# Patient Record
Sex: Female | Born: 1935 | Marital: Single | Smoking: Never smoker
Health system: Southern US, Community
[De-identification: ages and names within clinical notes are randomized; demographics above are authoritative.]

## PROBLEM LIST (undated history)

## (undated) DIAGNOSIS — M67919 Unspecified disorder of synovium and tendon, unspecified shoulder: Secondary | ICD-10-CM

## (undated) DIAGNOSIS — K635 Polyp of colon: Secondary | ICD-10-CM

## (undated) DIAGNOSIS — I10 Essential (primary) hypertension: Secondary | ICD-10-CM

## (undated) DIAGNOSIS — K259 Gastric ulcer, unspecified as acute or chronic, without hemorrhage or perforation: Secondary | ICD-10-CM

## (undated) DIAGNOSIS — E785 Hyperlipidemia, unspecified: Secondary | ICD-10-CM

## (undated) DIAGNOSIS — C801 Malignant (primary) neoplasm, unspecified: Secondary | ICD-10-CM

## (undated) DIAGNOSIS — B029 Zoster without complications: Secondary | ICD-10-CM

## (undated) DIAGNOSIS — M1990 Unspecified osteoarthritis, unspecified site: Secondary | ICD-10-CM

## (undated) DIAGNOSIS — K219 Gastro-esophageal reflux disease without esophagitis: Secondary | ICD-10-CM

## (undated) HISTORY — DX: Gastric ulcer, unspecified as acute or chronic, without hemorrhage or perforation: K25.9

## (undated) HISTORY — DX: Polyp of colon: K63.5

## (undated) HISTORY — DX: Unspecified osteoarthritis, unspecified site: M19.90

## (undated) HISTORY — PX: KNEE ARTHROSCOPY: SUR90

## (undated) HISTORY — DX: Essential (primary) hypertension: I10

## (undated) HISTORY — DX: Gastro-esophageal reflux disease without esophagitis: K21.9

## (undated) HISTORY — DX: Malignant (primary) neoplasm, unspecified: C80.1

## (undated) HISTORY — DX: Hyperlipidemia, unspecified: E78.5

## (undated) HISTORY — DX: Unspecified disorder of synovium and tendon, unspecified shoulder: M67.919

## (undated) HISTORY — DX: Zoster without complications: B02.9

---

## 2014-11-13 ENCOUNTER — Encounter: Payer: Self-pay | Admitting: Internal Medicine

## 2016-09-05 DIAGNOSIS — C44722 Squamous cell carcinoma of skin of right lower limb, including hip: Secondary | ICD-10-CM | POA: Diagnosis not present

## 2016-09-15 DIAGNOSIS — J019 Acute sinusitis, unspecified: Secondary | ICD-10-CM | POA: Diagnosis not present

## 2016-12-05 DIAGNOSIS — C44722 Squamous cell carcinoma of skin of right lower limb, including hip: Secondary | ICD-10-CM | POA: Diagnosis not present

## 2016-12-15 DIAGNOSIS — D492 Neoplasm of unspecified behavior of bone, soft tissue, and skin: Secondary | ICD-10-CM | POA: Diagnosis not present

## 2016-12-15 DIAGNOSIS — L570 Actinic keratosis: Secondary | ICD-10-CM | POA: Diagnosis not present

## 2017-01-19 DIAGNOSIS — D492 Neoplasm of unspecified behavior of bone, soft tissue, and skin: Secondary | ICD-10-CM | POA: Diagnosis not present

## 2017-01-19 DIAGNOSIS — C44219 Basal cell carcinoma of skin of left ear and external auricular canal: Secondary | ICD-10-CM | POA: Diagnosis not present

## 2017-01-23 DIAGNOSIS — C44219 Basal cell carcinoma of skin of left ear and external auricular canal: Secondary | ICD-10-CM | POA: Diagnosis not present

## 2017-02-02 DIAGNOSIS — L570 Actinic keratosis: Secondary | ICD-10-CM | POA: Diagnosis not present

## 2017-02-02 DIAGNOSIS — C44219 Basal cell carcinoma of skin of left ear and external auricular canal: Secondary | ICD-10-CM | POA: Diagnosis not present

## 2017-02-15 DIAGNOSIS — C44219 Basal cell carcinoma of skin of left ear and external auricular canal: Secondary | ICD-10-CM | POA: Diagnosis not present

## 2017-02-17 DIAGNOSIS — C44212 Basal cell carcinoma of skin of right ear and external auricular canal: Secondary | ICD-10-CM | POA: Diagnosis not present

## 2017-03-06 DIAGNOSIS — C44722 Squamous cell carcinoma of skin of right lower limb, including hip: Secondary | ICD-10-CM | POA: Diagnosis not present

## 2017-04-26 DIAGNOSIS — Z23 Encounter for immunization: Secondary | ICD-10-CM | POA: Diagnosis not present

## 2017-05-25 DIAGNOSIS — E782 Mixed hyperlipidemia: Secondary | ICD-10-CM | POA: Diagnosis not present

## 2017-05-25 DIAGNOSIS — Z6832 Body mass index (BMI) 32.0-32.9, adult: Secondary | ICD-10-CM | POA: Diagnosis not present

## 2017-05-25 DIAGNOSIS — E559 Vitamin D deficiency, unspecified: Secondary | ICD-10-CM | POA: Diagnosis not present

## 2017-05-25 DIAGNOSIS — I10 Essential (primary) hypertension: Secondary | ICD-10-CM | POA: Diagnosis not present

## 2017-05-25 DIAGNOSIS — E119 Type 2 diabetes mellitus without complications: Secondary | ICD-10-CM | POA: Diagnosis not present

## 2017-06-22 DIAGNOSIS — J019 Acute sinusitis, unspecified: Secondary | ICD-10-CM | POA: Diagnosis not present

## 2017-06-28 DIAGNOSIS — E119 Type 2 diabetes mellitus without complications: Secondary | ICD-10-CM | POA: Diagnosis not present

## 2017-06-28 DIAGNOSIS — H35372 Puckering of macula, left eye: Secondary | ICD-10-CM | POA: Diagnosis not present

## 2017-06-28 DIAGNOSIS — H35342 Macular cyst, hole, or pseudohole, left eye: Secondary | ICD-10-CM | POA: Diagnosis not present

## 2017-06-28 DIAGNOSIS — D3132 Benign neoplasm of left choroid: Secondary | ICD-10-CM | POA: Diagnosis not present

## 2017-09-12 DIAGNOSIS — H35373 Puckering of macula, bilateral: Secondary | ICD-10-CM | POA: Diagnosis not present

## 2017-09-27 ENCOUNTER — Ambulatory Visit: Payer: Self-pay | Admitting: Internal Medicine

## 2017-09-27 ENCOUNTER — Ambulatory Visit (INDEPENDENT_AMBULATORY_CARE_PROVIDER_SITE_OTHER): Payer: Medicare Other | Admitting: Internal Medicine

## 2017-09-27 VITALS — BP 118/64 | HR 76 | Temp 98.4°F | Ht 62.0 in | Wt 177.4 lb

## 2017-09-27 DIAGNOSIS — M109 Gout, unspecified: Secondary | ICD-10-CM | POA: Diagnosis not present

## 2017-09-27 DIAGNOSIS — N183 Chronic kidney disease, stage 3 unspecified: Secondary | ICD-10-CM | POA: Insufficient documentation

## 2017-09-27 DIAGNOSIS — Z1329 Encounter for screening for other suspected endocrine disorder: Secondary | ICD-10-CM | POA: Diagnosis not present

## 2017-09-27 DIAGNOSIS — Z1283 Encounter for screening for malignant neoplasm of skin: Secondary | ICD-10-CM | POA: Diagnosis not present

## 2017-09-27 DIAGNOSIS — E119 Type 2 diabetes mellitus without complications: Secondary | ICD-10-CM

## 2017-09-27 DIAGNOSIS — K219 Gastro-esophageal reflux disease without esophagitis: Secondary | ICD-10-CM | POA: Diagnosis not present

## 2017-09-27 DIAGNOSIS — E559 Vitamin D deficiency, unspecified: Secondary | ICD-10-CM | POA: Insufficient documentation

## 2017-09-27 DIAGNOSIS — I10 Essential (primary) hypertension: Secondary | ICD-10-CM | POA: Diagnosis not present

## 2017-09-27 DIAGNOSIS — Z85828 Personal history of other malignant neoplasm of skin: Secondary | ICD-10-CM | POA: Diagnosis not present

## 2017-09-27 DIAGNOSIS — M2550 Pain in unspecified joint: Secondary | ICD-10-CM

## 2017-09-27 DIAGNOSIS — E1122 Type 2 diabetes mellitus with diabetic chronic kidney disease: Secondary | ICD-10-CM | POA: Insufficient documentation

## 2017-09-27 LAB — URINALYSIS, ROUTINE W REFLEX MICROSCOPIC
Bilirubin Urine: NEGATIVE
Hgb urine dipstick: NEGATIVE
KETONES UR: NEGATIVE
Nitrite: NEGATIVE
PH: 6.5 (ref 5.0–8.0)
SPECIFIC GRAVITY, URINE: 1.015 (ref 1.000–1.030)
TOTAL PROTEIN, URINE-UPE24: NEGATIVE
URINE GLUCOSE: NEGATIVE
UROBILINOGEN UA: 0.2 (ref 0.0–1.0)

## 2017-09-27 LAB — CBC WITH DIFFERENTIAL/PLATELET
BASOS PCT: 0.9 % (ref 0.0–3.0)
Basophils Absolute: 0.1 10*3/uL (ref 0.0–0.1)
EOS ABS: 0.2 10*3/uL (ref 0.0–0.7)
EOS PCT: 2.4 % (ref 0.0–5.0)
HEMATOCRIT: 36.2 % (ref 36.0–46.0)
HEMOGLOBIN: 12.2 g/dL (ref 12.0–15.0)
LYMPHS PCT: 21 % (ref 12.0–46.0)
Lymphs Abs: 1.7 10*3/uL (ref 0.7–4.0)
MCHC: 33.7 g/dL (ref 30.0–36.0)
MCV: 97.5 fl (ref 78.0–100.0)
Monocytes Absolute: 0.7 10*3/uL (ref 0.1–1.0)
Monocytes Relative: 8.4 % (ref 3.0–12.0)
NEUTROS ABS: 5.6 10*3/uL (ref 1.4–7.7)
Neutrophils Relative %: 67.3 % (ref 43.0–77.0)
PLATELETS: 323 10*3/uL (ref 150.0–400.0)
RBC: 3.72 Mil/uL — ABNORMAL LOW (ref 3.87–5.11)
RDW: 12.6 % (ref 11.5–15.5)
WBC: 8.3 10*3/uL (ref 4.0–10.5)

## 2017-09-27 LAB — COMPREHENSIVE METABOLIC PANEL
ALT: 14 U/L (ref 0–35)
AST: 18 U/L (ref 0–37)
Albumin: 3.9 g/dL (ref 3.5–5.2)
Alkaline Phosphatase: 53 U/L (ref 39–117)
BUN: 22 mg/dL (ref 6–23)
CALCIUM: 9.9 mg/dL (ref 8.4–10.5)
CHLORIDE: 102 meq/L (ref 96–112)
CO2: 31 meq/L (ref 19–32)
CREATININE: 1.38 mg/dL — AB (ref 0.40–1.20)
GFR: 38.37 mL/min — ABNORMAL LOW (ref >60.00)
Glucose, Bld: 101 mg/dL — ABNORMAL HIGH (ref 70–99)
POTASSIUM: 4.3 meq/L (ref 3.5–5.1)
SODIUM: 141 meq/L (ref 135–145)
Total Bilirubin: 1 mg/dL (ref 0.2–1.2)
Total Protein: 6.8 g/dL (ref 6.0–8.3)

## 2017-09-27 LAB — LIPID PANEL
CHOL/HDL RATIO: 4
Cholesterol: 242 mg/dL — ABNORMAL HIGH (ref 0–200)
HDL: 56.6 mg/dL (ref >39.00)
LDL CALC: 157 mg/dL — AB (ref 0–99)
NONHDL: 185.11
TRIGLYCERIDES: 142 mg/dL (ref 0.0–149.0)
VLDL: 28.4 mg/dL (ref 0.0–40.0)

## 2017-09-27 LAB — MICROALBUMIN / CREATININE URINE RATIO
CREATININE, U: 179.2 mg/dL
MICROALB UR: 1.7 mg/dL (ref 0.0–1.9)
Microalb Creat Ratio: 0.9 mg/g (ref 0.0–30.0)

## 2017-09-27 LAB — VITAMIN D 25 HYDROXY (VIT D DEFICIENCY, FRACTURES): VITD: 50.64 ng/mL (ref 30.00–100.00)

## 2017-09-27 LAB — TSH: TSH: 2.12 u[IU]/mL (ref 0.35–4.50)

## 2017-09-27 LAB — URIC ACID: Uric Acid, Serum: 6.9 mg/dL (ref 2.4–7.0)

## 2017-09-27 LAB — HEMOGLOBIN A1C: Hgb A1c MFr Bld: 5.9 % (ref 4.6–6.5)

## 2017-09-27 MED ORDER — SITAGLIPTIN PHOSPHATE 100 MG PO TABS: 100.0000 mg | ORAL_TABLET | Freq: Every day | ORAL | 1 refills | Status: DC

## 2017-09-27 MED ORDER — LOSARTAN POTASSIUM-HCTZ 100-25 MG PO TABS: 1.0000 | ORAL_TABLET | Freq: Every day | ORAL | 1 refills | Status: DC

## 2017-09-27 NOTE — Progress Notes (Signed)
Pre visit review using our clinic review tool, if applicable. No additional management support is needed unless otherwise documented below in the visit note. 

## 2017-09-27 NOTE — Patient Instructions (Signed)
Try debrox drops 5 drops 2x per day to right ear x 4-7 days  F/u in 3-4 months sooner if needed    Hypertension Hypertension is another name for high blood pressure. High blood pressure forces your heart to work harder to pump blood. This can cause problems over time. There are two numbers in a blood pressure reading. There is a top number (systolic) over a bottom number (diastolic). It is best to have a blood pressure below 120/80. Healthy choices can help lower your blood pressure. You may need medicine to help lower your blood pressure if:  Your blood pressure cannot be lowered with healthy choices.  Your blood pressure is higher than 130/80.  Follow these instructions at home: Eating and drinking  If directed, follow the DASH eating plan. This diet includes: ? Filling half of your plate at each meal with fruits and vegetables. ? Filling one quarter of your plate at each meal with whole grains. Whole grains include whole wheat pasta, brown rice, and whole grain bread. ? Eating or drinking low-fat dairy products, such as skim milk or low-fat yogurt. ? Filling one quarter of your plate at each meal with low-fat (lean) proteins. Low-fat proteins include fish, skinless chicken, eggs, beans, and tofu. ? Avoiding fatty meat, cured and processed meat, or chicken with skin. ? Avoiding premade or processed food.  Eat less than 1,500 mg of salt (sodium) a day.  Limit alcohol use to no more than 1 drink a day for nonpregnant women and 2 drinks a day for men. One drink equals 12 oz of beer, 5 oz of wine, or 1 oz of hard liquor. Lifestyle  Work with your doctor to stay at a healthy weight or to lose weight. Ask your doctor what the best weight is for you.  Get at least 30 minutes of exercise that causes your heart to beat faster (aerobic exercise) most days of the week. This may include walking, swimming, or biking.  Get at least 30 minutes of exercise that strengthens your muscles (resistance  exercise) at least 3 days a week. This may include lifting weights or pilates.  Do not use any products that contain nicotine or tobacco. This includes cigarettes and e-cigarettes. If you need help quitting, ask your doctor.  Check your blood pressure at home as told by your doctor.  Keep all follow-up visits as told by your doctor. This is important. Medicines  Take over-the-counter and prescription medicines only as told by your doctor. Follow directions carefully.  Do not skip doses of blood pressure medicine. The medicine does not work as well if you skip doses. Skipping doses also puts you at risk for problems.  Ask your doctor about side effects or reactions to medicines that you should watch for. Contact a doctor if:  You think you are having a reaction to the medicine you are taking.  You have headaches that keep coming back (recurring).  You feel dizzy.  You have swelling in your ankles.  You have trouble with your vision. Get help right away if:  You get a very bad headache.  You start to feel confused.  You feel weak or numb.  You feel faint.  You get very bad pain in your: ? Chest. ? Belly (abdomen).  You throw up (vomit) more than once.  You have trouble breathing. Summary  Hypertension is another name for high blood pressure.  Making healthy choices can help lower blood pressure. If your blood pressure cannot be  controlled with healthy choices, you may need to take medicine. This information is not intended to replace advice given to you by your health care provider. Make sure you discuss any questions you have with your health care provider. Document Released: 01/25/2008 Document Revised: 07/06/2016 Document Reviewed: 07/06/2016 Elsevier Interactive Patient Education  2018 [place], Adult The ears produce a substance called earwax that helps keep bacteria out of the ear and protects the skin in the ear canal. Occasionally,  earwax can build up in the ear and cause discomfort or hearing loss. What increases the risk? This condition is more likely to develop in people who:  Are female.  Are elderly.  Naturally produce more earwax.  Clean their ears often with cotton swabs.  Use earplugs often.  Use in-ear headphones often.  Wear hearing aids.  Have narrow ear canals.  Have earwax that is overly thick or sticky.  Have eczema.  Are dehydrated.  Have excess hair in the ear canal.  What are the signs or symptoms? Symptoms of this condition include:  Reduced or muffled hearing.  A feeling of fullness in the ear or feeling that the ear is plugged.  Fluid coming from the ear.  Ear pain.  Ear itch.  Ringing in the ear.  Coughing.  An obvious piece of earwax that can be seen inside the ear canal.  How is this diagnosed? This condition may be diagnosed based on:  Your symptoms.  Your medical history.  An ear exam. During the exam, your health care provider will look into your ear with an instrument called an otoscope.  You may have tests, including a hearing test. How is this treated? This condition may be treated by:  Using ear drops to soften the earwax.  Having the earwax removed by a health care provider. The health care provider may: ? Flush the ear with water. ? Use an instrument that has a loop on the end (curette). ? Use a suction device.  Surgery to remove the wax buildup. This may be done in severe cases.  Follow these instructions at home:  Take over-the-counter and prescription medicines only as told by your health care provider.  Do not put any objects, including cotton swabs, into your ear. You can clean the opening of your ear canal with a washcloth or facial tissue.  Follow instructions from your health care provider about cleaning your ears. Do not over-clean your ears.  Drink enough fluid to keep your urine clear or pale yellow. This will help to thin the  earwax.  Keep all follow-up visits as told by your health care provider. If earwax builds up in your ears often or if you use hearing aids, consider seeing your health care provider for routine, preventive ear cleanings. Ask your health care provider how often you should schedule your cleanings.  If you have hearing aids, clean them according to instructions from the manufacturer and your health care provider. Contact a health care provider if:  You have ear pain.  You develop a fever.  You have blood, pus, or other fluid coming from your ear.  You have hearing loss.  You have ringing in your ears that does not go away.  Your symptoms do not improve with treatment.  You feel like the room is spinning (vertigo). Summary  Earwax can build up in the ear and cause discomfort or hearing loss.  The most common symptoms of this condition include reduced or muffled  hearing and a feeling of fullness in the ear or feeling that the ear is plugged.  This condition may be diagnosed based on your symptoms, your medical history, and an ear exam.  This condition may be treated by using ear drops to soften the earwax or by having the earwax removed by a health care provider.  Do not put any objects, including cotton swabs, into your ear. You can clean the opening of your ear canal with a washcloth or facial tissue. This information is not intended to replace advice given to you by your health care provider. Make sure you discuss any questions you have with your health care provider. Document Released: 09/15/2004 Document Revised: 10/19/2016 Document Reviewed: 10/19/2016 Elsevier Interactive Patient Education  2018 [employer]vier Inc[employer]  

## 2017-09-30 ENCOUNTER — Other Ambulatory Visit: Payer: Self-pay | Admitting: Internal Medicine

## 2017-09-30 ENCOUNTER — Encounter: Payer: Self-pay | Admitting: Internal Medicine

## 2017-09-30 DIAGNOSIS — M109 Gout, unspecified: Secondary | ICD-10-CM | POA: Insufficient documentation

## 2017-09-30 DIAGNOSIS — E119 Type 2 diabetes mellitus without complications: Secondary | ICD-10-CM

## 2017-09-30 DIAGNOSIS — K219 Gastro-esophageal reflux disease without esophagitis: Secondary | ICD-10-CM | POA: Insufficient documentation

## 2017-09-30 DIAGNOSIS — I10 Essential (primary) hypertension: Secondary | ICD-10-CM | POA: Insufficient documentation

## 2017-09-30 MED ORDER — SITAGLIPTIN PHOSPHATE 50 MG PO TABS: 50.0000 mg | ORAL_TABLET | Freq: Every day | ORAL | 1 refills | Status: DC

## 2017-09-30 NOTE — Progress Notes (Signed)
Chief Complaint  Patient presents with  . Establish Care   Establish care sister who is also a pt with her at visit today  1. She feels like something is in right ear and would like it checked  2. DM 2 on Metformin 500 mg bid and Januvia 100 qd could not tolerate statin in the past fasting cbgs 118-149 per pt She saw [facility] last week  3. HTN controlled on Hyzaar 100-25  4. Denies recent falls walks with cane    Review of Systems  Constitutional: Negative for weight loss.  HENT:       +something in right ear   Eyes:       No vision problems  Respiratory: Negative for shortness of breath.   Cardiovascular: Negative for chest pain.  Gastrointestinal: Positive for heartburn. Negative for abdominal pain.  Musculoskeletal: Negative for falls.  Skin: Negative for rash.  Neurological: Negative for dizziness.  Psychiatric/Behavioral: Negative for depression.   Past Medical History:  Diagnosis Date  . Arthritis   . Cancer ([place])    skin mohs left ear ? 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 Colon polyps   . Gastric ulcer   . GERD (gastroesophageal reflux disease)   . Hyperlipidemia   . Hypertension   . Rotator cuff disorder    right  . Shingles    Past Surgical History:  Procedure Laterality Date  . KNEE ARTHROSCOPY     b/l    Family History  Problem Relation Age of Onset  . Cancer Mother        ?colon   . Diabetes Mother   . Early death Mother   . Heart disease Mother   . Hypertension Mother   . Hyperlipidemia Mother   . Cancer Father        ? MM  . Diabetes Sister   . Diabetes Brother   . Arthritis Brother    Social History   Socioeconomic History  . Marital status: Single    Spouse name: Not on file  . Number of children: Not on file  . Years of education: Not on file  . Highest education level: Not on file  Social Needs  . Financial resource strain: Not on file  . Food insecurity - worry: Not on file  . Food insecurity -  inability: Not on file  . Transportation needs - medical: Not on file  . Transportation needs - non-medical: Not on file  Occupational History  . Not on file  Tobacco Use  . Smoking status: Never Smoker  . Smokeless tobacco: Never Used  Substance and Sexual Activity  . Alcohol use: Yes  . Drug use: No  . Sexual activity: No  Other Topics Concern  . Not on file  Social History Narrative   Retired former Lawyer, assoc. Degree    Moved from [place]    Current Meds  Medication Sig  . Cholecalciferol (VITAMIN D3) 2000 units TABS Take by mouth.  . losartan-hydrochlorothiazide (HYZAAR) 100-25 MG tablet Take 1 tablet by mouth daily.  . metFORMIN (GLUCOPHAGE) 500 MG tablet Take 500 mg by mouth 2 (two) times daily with a meal.  . omeprazole (PRILOSEC) 10 MG capsule Take 20 mg by mouth daily.  . sitaGLIPtin (JANUVIA) 100 MG tablet Take 1 tablet (100 mg total) by mouth daily.  . [DISCONTINUED] losartan-hydrochlorothiazide (HYZAAR) 100-25 MG tablet Take 1 tablet by mouth daily.  . [DISCONTINUED] sitaGLIPtin (JANUVIA)  100 MG tablet Take 100 mg by mouth daily.   Not on File Recent Results (from the past 2160 hour(s))  Comprehensive metabolic panel     Status: Abnormal   Collection Time: 09/27/17 11:48 AM  Result Value Ref Range   Sodium 141 135 - 145 mEq/L   Potassium 4.3 3.5 - 5.1 mEq/L   Chloride 102 96 - 112 mEq/L   CO2 31 19 - 32 mEq/L   Glucose, Bld 101 (H) 70 - 99 mg/dL   BUN 22 6 - 23 mg/dL   Creatinine, Ser 1.38 (H) 0.40 - 1.20 mg/dL   Total Bilirubin 1.0 0.2 - 1.2 mg/dL   Alkaline Phosphatase 53 39 - 117 U/L   AST 18 0 - 37 U/L   ALT 14 0 - 35 U/L   Total Protein 6.8 6.0 - 8.3 g/dL   Albumin 3.9 3.5 - 5.2 g/dL   Calcium 9.9 8.4 - 10.5 mg/dL   GFR 38.37 (L) >60.00 mL/min  CBC with Differential/Platelet     Status: Abnormal   Collection Time: 09/27/17 11:48 AM  Result Value Ref Range   WBC 8.3 4.0 - 10.5 K/uL   RBC 3.72 (L) 3.87 - 5.11 Mil/uL    Hemoglobin 12.2 12.0 - 15.0 g/dL   HCT 36.2 36.0 - 46.0 %   MCV 97.5 78.0 - 100.0 fl   MCHC 33.7 30.0 - 36.0 g/dL   RDW 12.6 11.5 - 15.5 %   Platelets 323.0 150.0 - 400.0 K/uL   Neutrophils Relative % 67.3 43.0 - 77.0 %   Lymphocytes Relative 21.0 12.0 - 46.0 %   Monocytes Relative 8.4 3.0 - 12.0 %   Eosinophils Relative 2.4 0.0 - 5.0 %   Basophils Relative 0.9 0.0 - 3.0 %   Neutro Abs 5.6 1.4 - 7.7 K/uL   Lymphs Abs 1.7 0.7 - 4.0 K/uL   Monocytes Absolute 0.7 0.1 - 1.0 K/uL   Eosinophils Absolute 0.2 0.0 - 0.7 K/uL   Basophils Absolute 0.1 0.0 - 0.1 K/uL  Lipid panel     Status: Abnormal   Collection Time: 09/27/17 11:48 AM  Result Value Ref Range   Cholesterol 242 (H) 0 - 200 mg/dL    Comment: ATP III Classification       Desirable:  < 200 mg/dL               Borderline High:  200 - 239 mg/dL          High:  > = 240 mg/dL   Triglycerides 142.0 0.0 - 149.0 mg/dL    Comment: Normal:  <150 mg/dLBorderline High:  150 - 199 mg/dL   HDL 56.60 >39.00 mg/dL   VLDL 28.4 0.0 - 40.0 mg/dL   LDL Cholesterol 157 (H) 0 - 99 mg/dL   Total CHOL/HDL Ratio 4     Comment:                Men          Women1/2 Average Risk     3.4          3.3Average Risk          5.0          4.42X Average Risk          9.6          7.13X Average Risk          15.0          11.0  NonHDL 185.11     Comment: NOTE:  Non-HDL goal should be 30 mg/dL higher than patient's LDL goal (i.e. LDL goal of < 70 mg/dL, would have non-HDL goal of < 100 mg/dL)  Urinalysis, Routine w reflex microscopic     Status: Abnormal   Collection Time: 09/27/17 11:48 AM  Result Value Ref Range   Color, Urine YELLOW Yellow;Lt. Yellow   APPearance CLEAR Clear   Specific Gravity, Urine 1.015 1.000 - 1.030   pH 6.5 5.0 - 8.0   Total Protein, Urine NEGATIVE Negative   Urine Glucose NEGATIVE Negative   Ketones, ur NEGATIVE Negative   Bilirubin Urine NEGATIVE Negative   Hgb urine dipstick NEGATIVE Negative   Urobilinogen,  UA 0.2 0.0 - 1.0   Leukocytes, UA TRACE (A) Negative   Nitrite NEGATIVE Negative   WBC, UA 3-6/hpf (A) 0-2/hpf   RBC / HPF 0-2/hpf 0-2/hpf   Mucus, UA Presence of (A) None   Squamous Epithelial / LPF Rare(0-4/hpf) Rare(0-4/hpf)  Urine Microalbumin w/creat. ratio     Status: None   Collection Time: 09/27/17 11:48 AM  Result Value Ref Range   Microalb, Ur 1.7 0.0 - 1.9 mg/dL   Creatinine,U 179.2 mg/dL   Microalb Creat Ratio 0.9 0.0 - 30.0 mg/g  TSH     Status: None   Collection Time: 09/27/17 11:48 AM  Result Value Ref Range   TSH 2.12 0.35 - 4.50 uIU/mL  Uric acid     Status: None   Collection Time: 09/27/17 11:48 AM  Result Value Ref Range   Uric Acid, Serum 6.9 2.4 - 7.0 mg/dL  Hemoglobin A1c     Status: None   Collection Time: 09/27/17 11:48 AM  Result Value Ref Range   Hgb A1c MFr Bld 5.9 4.6 - 6.5 %    Comment: Glycemic Control Guidelines for People with Diabetes:Non Diabetic:  <6%Goal of Therapy: <7%Additional Action Suggested:  >8%   Vitamin D (25 hydroxy)     Status: None   Collection Time: 09/27/17 11:48 AM  Result Value Ref Range   VITD 50.64 30.00 - 100.00 ng/mL   Objective  Body mass index is 32.45 kg/m. Wt Readings from Last 3 Encounters:  09/27/17 177 lb 6.4 oz (80.5 kg)   Temp Readings from Last 3 Encounters:  09/27/17 98.4 F (36.9 C) (Oral)   BP Readings from Last 3 Encounters:  09/27/17 118/64   Pulse Readings from Last 3 Encounters:  09/27/17 76   O2 sat room air 96%  Physical Exam  Constitutional: She is oriented to person, place, and time and well-developed, well-nourished, and in no distress. Vital signs are normal.  HENT:  Head: Normocephalic and atraumatic.  Mouth/Throat: Oropharynx is clear and moist and mucous membranes are normal.  +cerumen right ear   Eyes: Conjunctivae are normal. Pupils are equal, round, and reactive to light.  Cardiovascular: Normal rate, regular rhythm and normal heart sounds.  Pulmonary/Chest: Effort normal and  breath sounds normal.  Abdominal: Soft. Bowel sounds are normal.  Neurological: She is alert and oriented to person, place, and time. Gait normal.  BL walks with cane   Skin: Skin is warm, dry and intact.  Psychiatric: Mood, memory, affect and judgment normal.  Nursing note and vitals reviewed.   Assessment   1. HTN controlled  2. DM 2  3.right ear cerumen  4. H/o gout no meds  Plan   1. Cont hyzaar 100/25 mg qd  Labs CMET, CBC, lipid, UA, protein, TSH, uric acid,  vit D, A1C declines  H pylori check  2. Cont metformin 500 mg bid, Januvia 100 mg qd  Eye exam [facility] last week need to get records  Do foot exam in future  Unable to tolerate statin, on ARB 3. Monitor recheck ears at f/u  4. Uric acid today  5.  Flu 04/27/18 Tdap never declines  pna vx 1/2 ? Which one in 2017  Declines shingrix  Pending records old PCP  Dr. Cor[facility]e [facility] including vaccines, labs, prior mammograms, colonoscopies if present   Last colonoscopy several years ago h/o polyps no further testing other than fobt  Out of age window pap LMP 85 years ago  DEXA consider in future if not had  Mammogram out of age window  Refer to dermatology Dr. Anthoney Harada h/o skin cancer SCC, [facility]  Provider: Dr. Olivia Mackie McLean-Scocuzza-Internal Medicine

## 2017-10-03 ENCOUNTER — Other Ambulatory Visit: Payer: Self-pay | Admitting: Internal Medicine

## 2017-10-03 DIAGNOSIS — N183 Chronic kidney disease, stage 3 unspecified: Secondary | ICD-10-CM

## 2017-10-03 NOTE — Progress Notes (Signed)
Spoke with pt rec januvia 50 mg qd for now and stop metformin  Pt and sister mae and pt hesistent to cut doses in 1/2 januvia and stop metformin despite recs based on kidney function Will come back in for kidney check in 1 month and reassess kidney #s per pt request and go from there  Thanks [place]

## 2017-10-03 NOTE — Progress Notes (Signed)
Reviewed notes 05/25/17 PCP  She was given 5FU topical 11/2016 h/o SCC upper ext right upper arm, h/o Aks  She was on singulair 10 mg qd allergic rhinitis  H/o vit D def  DM eye exam 06/20/16 neg Dr. Sunday Corn DEXA 01/16/09 normal did not want repeat studies  H/o vertigo  Prior A1C 7.9 10/18/15 urine had 10/18/15  Right torn rotator cuff  Uric acid 9.0 02/24/16   Flu shot had 04/26/17, prevnar had 11/13/14, pna 23 06/02/09   [place]

## 2017-10-03 NOTE — Progress Notes (Signed)
Spoke with pt rec januvia 50 mg qd for now and stop metformin  Pt and sister mae and pt hesistent to cut doses in 1/2 januvia and stop metformin despite recs based on kidney function Will come back in for kidney check in 1 month and reassess kidney #s per pt request and go from there  Thanks [place] please call pt in am and sch labs BMET 10/31/17   Thanks [place]

## 2017-10-31 ENCOUNTER — Inpatient Hospital Stay
Admission: EM | Admit: 2017-10-31 | Discharge: 2017-11-05 | DRG: 918 | Disposition: A | Discharge status: Other Acute Inpatient Hospital | Payer: Medicare Other | Attending: Internal Medicine | Admitting: Internal Medicine

## 2017-10-31 ENCOUNTER — Emergency Department: Payer: Medicare Other

## 2017-10-31 ENCOUNTER — Encounter
Admission: RE | Admit: 2017-10-31 | Discharge: 2017-10-31 | Disposition: A | Discharge status: Home / Self Care | Payer: Medicare Other | Source: Ambulatory Visit | Attending: Internal Medicine | Admitting: Internal Medicine

## 2017-10-31 ENCOUNTER — Other Ambulatory Visit: Payer: Medicare Other

## 2017-10-31 DIAGNOSIS — Z7984 Long term (current) use of oral hypoglycemic drugs: Secondary | ICD-10-CM

## 2017-10-31 DIAGNOSIS — Z8249 Family history of ischemic heart disease and other diseases of the circulatory system: Secondary | ICD-10-CM | POA: Diagnosis not present

## 2017-10-31 DIAGNOSIS — Z8711 Personal history of peptic ulcer disease: Secondary | ICD-10-CM | POA: Diagnosis not present

## 2017-10-31 DIAGNOSIS — Y92092 Bedroom in other non-institutional residence as the place of occurrence of the external cause: Secondary | ICD-10-CM | POA: Diagnosis not present

## 2017-10-31 DIAGNOSIS — T1491XA Suicide attempt, initial encounter: Secondary | ICD-10-CM

## 2017-10-31 DIAGNOSIS — T50902A Poisoning by unspecified drugs, medicaments and biological substances, intentional self-harm, initial encounter: Secondary | ICD-10-CM | POA: Diagnosis not present

## 2017-10-31 DIAGNOSIS — N183 Chronic kidney disease, stage 3 (moderate): Secondary | ICD-10-CM | POA: Diagnosis present

## 2017-10-31 DIAGNOSIS — F322 Major depressive disorder, single episode, severe without psychotic features: Secondary | ICD-10-CM | POA: Diagnosis not present

## 2017-10-31 DIAGNOSIS — Z66 Do not resuscitate: Secondary | ICD-10-CM | POA: Diagnosis present

## 2017-10-31 DIAGNOSIS — G9340 Encephalopathy, unspecified: Secondary | ICD-10-CM

## 2017-10-31 DIAGNOSIS — T383X2A Poisoning by insulin and oral hypoglycemic [antidiabetic] drugs, intentional self-harm, initial encounter: Secondary | ICD-10-CM | POA: Diagnosis present

## 2017-10-31 DIAGNOSIS — R809 Proteinuria, unspecified: Secondary | ICD-10-CM | POA: Diagnosis not present

## 2017-10-31 DIAGNOSIS — E119 Type 2 diabetes mellitus without complications: Secondary | ICD-10-CM

## 2017-10-31 DIAGNOSIS — E785 Hyperlipidemia, unspecified: Secondary | ICD-10-CM | POA: Diagnosis present

## 2017-10-31 DIAGNOSIS — Z8349 Family history of other endocrine, nutritional and metabolic diseases: Secondary | ICD-10-CM | POA: Diagnosis not present

## 2017-10-31 DIAGNOSIS — J690 Pneumonitis due to inhalation of food and vomit: Secondary | ICD-10-CM | POA: Diagnosis not present

## 2017-10-31 DIAGNOSIS — R4020 Unspecified coma: Secondary | ICD-10-CM | POA: Diagnosis not present

## 2017-10-31 DIAGNOSIS — N179 Acute kidney failure, unspecified: Secondary | ICD-10-CM | POA: Diagnosis present

## 2017-10-31 DIAGNOSIS — E860 Dehydration: Secondary | ICD-10-CM | POA: Diagnosis present

## 2017-10-31 DIAGNOSIS — Z833 Family history of diabetes mellitus: Secondary | ICD-10-CM | POA: Diagnosis not present

## 2017-10-31 DIAGNOSIS — R4182 Altered mental status, unspecified: Secondary | ICD-10-CM | POA: Diagnosis present

## 2017-10-31 DIAGNOSIS — I129 Hypertensive chronic kidney disease with stage 1 through stage 4 chronic kidney disease, or unspecified chronic kidney disease: Secondary | ICD-10-CM | POA: Diagnosis present

## 2017-10-31 DIAGNOSIS — Z86010 Personal history of colonic polyps: Secondary | ICD-10-CM

## 2017-10-31 DIAGNOSIS — I10 Essential (primary) hypertension: Secondary | ICD-10-CM | POA: Diagnosis not present

## 2017-10-31 DIAGNOSIS — Z8261 Family history of arthritis: Secondary | ICD-10-CM | POA: Diagnosis not present

## 2017-10-31 DIAGNOSIS — E1122 Type 2 diabetes mellitus with diabetic chronic kidney disease: Secondary | ICD-10-CM | POA: Diagnosis present

## 2017-10-31 DIAGNOSIS — T383X1A Poisoning by insulin and oral hypoglycemic [antidiabetic] drugs, accidental (unintentional), initial encounter: Secondary | ICD-10-CM | POA: Diagnosis not present

## 2017-10-31 DIAGNOSIS — E872 Acidosis, unspecified: Secondary | ICD-10-CM

## 2017-10-31 LAB — GLUCOSE, CAPILLARY
GLUCOSE-CAPILLARY: 138 mg/dL — AB (ref 65–99)
Glucose-Capillary: 172 mg/dL — ABNORMAL HIGH (ref 65–99)
Glucose-Capillary: 106 mg/dL — ABNORMAL HIGH (ref 65–99)

## 2017-10-31 LAB — URINE DRUG SCREEN, QUALITATIVE (ARMC ONLY)
AMPHETAMINES, UR SCREEN: NOT DETECTED
BENZODIAZEPINE, UR SCRN: NOT DETECTED
Barbiturates, Ur Screen: NOT DETECTED
CANNABINOID 50 NG, UR ~~LOC~~: NOT DETECTED
Cocaine Metabolite,Ur ~~LOC~~: NOT DETECTED
MDMA (Ecstasy)Ur Screen: NOT DETECTED
Methadone Scn, Ur: NOT DETECTED
Opiate, Ur Screen: NOT DETECTED
PHENCYCLIDINE (PCP) UR S: NOT DETECTED
Tricyclic, Ur Screen: NOT DETECTED

## 2017-10-31 LAB — POCT PREGNANCY, URINE: Preg Test, Ur: NEGATIVE

## 2017-10-31 LAB — URINALYSIS, COMPLETE (UACMP) WITH MICROSCOPIC
Bilirubin Urine: NEGATIVE
Glucose, UA: 50 mg/dL — AB
Hgb urine dipstick: NEGATIVE
KETONES UR: NEGATIVE mg/dL
Leukocytes, UA: NEGATIVE
Nitrite: NEGATIVE
PH: 6 (ref 5.0–8.0)
PROTEIN: 30 mg/dL — AB
RBC / HPF: NONE SEEN RBC/hpf (ref 0–5)
Specific Gravity, Urine: 1.008 (ref 1.005–1.030)

## 2017-10-31 LAB — COMPREHENSIVE METABOLIC PANEL
ALK PHOS: 59 U/L (ref 38–126)
ALT: 13 U/L — AB (ref 14–54)
AST: 30 U/L (ref 15–41)
Albumin: 3.7 g/dL (ref 3.5–5.0)
Anion gap: 14 (ref 5–15)
BILIRUBIN TOTAL: 1.9 mg/dL — AB (ref 0.3–1.2)
BUN: 16 mg/dL (ref 6–20)
CALCIUM: 8.8 mg/dL — AB (ref 8.9–10.3)
CO2: 20 mmol/L — AB (ref 22–32)
Chloride: 105 mmol/L (ref 101–111)
Creatinine, Ser: 1.36 mg/dL — ABNORMAL HIGH (ref 0.44–1.00)
GFR calc non Af Amer: 34 mL/min — ABNORMAL LOW (ref >60)
GFR, EST AFRICAN AMERICAN: 39 mL/min — AB (ref >60)
Glucose, Bld: 167 mg/dL — ABNORMAL HIGH (ref 65–99)
Potassium: 3.9 mmol/L (ref 3.5–5.1)
Sodium: 139 mmol/L (ref 135–145)
TOTAL PROTEIN: 6.6 g/dL (ref 6.5–8.1)

## 2017-10-31 LAB — CBC WITH DIFFERENTIAL/PLATELET
Basophils Absolute: 0.1 10*3/uL (ref 0–0.1)
Basophils Relative: 1 %
Eosinophils Absolute: 0 10*3/uL (ref 0–0.7)
Eosinophils Relative: 0 %
HEMATOCRIT: 35.6 % (ref 35.0–47.0)
Hemoglobin: 11.9 g/dL — ABNORMAL LOW (ref 12.0–16.0)
LYMPHS ABS: 1.1 10*3/uL (ref 1.0–3.6)
LYMPHS PCT: 9 %
MCH: 32.3 pg (ref 26.0–34.0)
MCHC: 33.4 g/dL (ref 32.0–36.0)
MCV: 96.8 fL (ref 80.0–100.0)
MONOS PCT: 6 %
Monocytes Absolute: 0.7 10*3/uL (ref 0.2–0.9)
Neutro Abs: 10.3 10*3/uL — ABNORMAL HIGH (ref 1.4–6.5)
Neutrophils Relative %: 84 %
Platelets: 327 10*3/uL (ref 150–440)
RBC: 3.68 MIL/uL — AB (ref 3.80–5.20)
RDW: 12.6 % (ref 11.5–14.5)
WBC: 12.3 10*3/uL — AB (ref 3.6–11.0)

## 2017-10-31 LAB — MAGNESIUM: MAGNESIUM: 1.2 mg/dL — AB (ref 1.7–2.4)

## 2017-10-31 LAB — LACTIC ACID, PLASMA
LACTIC ACID, VENOUS: 5.2 mmol/L — AB (ref 0.5–1.9)
LACTIC ACID, VENOUS: 8.4 mmol/L — AB (ref 0.5–1.9)
Lactic Acid, Venous: 7.6 mmol/L (ref 0.5–1.9)

## 2017-10-31 LAB — SALICYLATE LEVEL: Salicylate Lvl: <7 mg/dL (ref 2.8–30.0)

## 2017-10-31 LAB — ACETAMINOPHEN LEVEL: Acetaminophen (Tylenol), Serum: <10 ug/mL — ABNORMAL LOW (ref 10–30)

## 2017-10-31 MED ORDER — ONDANSETRON HCL 4 MG/2ML IJ SOLN
4.0000 mg | Freq: Four times a day (QID) | INTRAMUSCULAR | Status: DC | PRN
  Administered 2017-10-31: 4 mg via INTRAVENOUS
  Filled 2017-10-31: qty 2

## 2017-10-31 MED ORDER — MAGNESIUM SULFATE 4 GM/100ML IV SOLN
4.0000 g | Freq: Once | INTRAVENOUS | Status: AC
  Administered 2017-11-01: 4 g via INTRAVENOUS
  Filled 2017-10-31: qty 100

## 2017-10-31 MED ORDER — SODIUM CHLORIDE 0.9 % IV BOLUS (SEPSIS)
1000.0000 mL | Freq: Once | INTRAVENOUS | Status: AC
  Administered 2017-10-31: 1000 mL via INTRAVENOUS

## 2017-10-31 MED ORDER — SODIUM CHLORIDE 0.9 % IV SOLN
Freq: Once | INTRAVENOUS | Status: AC
  Administered 2017-10-31: 17:00:00 via INTRAVENOUS

## 2017-10-31 MED ORDER — SENNOSIDES-DOCUSATE SODIUM 8.6-50 MG PO TABS
1.0000 | ORAL_TABLET | Freq: Every evening | ORAL | Status: DC | PRN
  Administered 2017-11-04: 1 via ORAL
  Filled 2017-10-31: qty 1

## 2017-10-31 MED ORDER — DEXTROSE-NACL 5-0.9 % IV SOLN
INTRAVENOUS | Status: DC
  Administered 2017-10-31: 19:00:00 via INTRAVENOUS

## 2017-10-31 MED ORDER — LOSARTAN POTASSIUM 50 MG PO TABS
50.0000 mg | ORAL_TABLET | Freq: Every day | ORAL | Status: DC
  Administered 2017-10-31 – 2017-11-01 (×2): 50 mg via ORAL
  Filled 2017-10-31 (×2): qty 1

## 2017-10-31 MED ORDER — HYDRALAZINE HCL 20 MG/ML IJ SOLN: 10.0000 mg | INTRAMUSCULAR | Status: DC | PRN

## 2017-10-31 MED ORDER — ACETAMINOPHEN 650 MG RE SUPP: 650.0000 mg | Freq: Four times a day (QID) | RECTAL | Status: DC | PRN

## 2017-10-31 MED ORDER — MAGNESIUM SULFATE 2 GM/50ML IV SOLN: 2.0000 g | Freq: Once | INTRAVENOUS | Status: DC

## 2017-10-31 MED ORDER — ONDANSETRON HCL 4 MG PO TABS: 4.0000 mg | ORAL_TABLET | Freq: Four times a day (QID) | ORAL | Status: DC | PRN

## 2017-10-31 MED ORDER — ENOXAPARIN SODIUM 30 MG/0.3ML ~~LOC~~ SOLN
30.0000 mg | SUBCUTANEOUS | Status: DC
  Administered 2017-10-31: 30 mg via SUBCUTANEOUS
  Filled 2017-10-31 (×2): qty 0.3

## 2017-10-31 MED ORDER — ACETAMINOPHEN 325 MG PO TABS: 650.0000 mg | ORAL_TABLET | Freq: Four times a day (QID) | ORAL | Status: DC | PRN

## 2017-10-31 NOTE — ED Triage Notes (Signed)
Pt presents today for taking the rest of her meds, pt \\lives  with sister in the independent living. Pt takes the rest of metromin that was a 180 day that was filled on 08/08/18. Pt ingested approx. 60at 500 mg. Carleene Cooper is [facility]. Pt is alert and unable to communicate. EDP at bedside.

## 2017-10-31 NOTE — Progress Notes (Signed)
Abnormal lab result, lactic=-d acid 7.6, MD notified .

## 2017-10-31 NOTE — Consult Note (Signed)
[facility] Consult Services  Impression: Dysarthria  Likely due to toxic effects (metformin, glipizide, other unknown medications) Given timing and circumstances surrounding the onset of symptoms, stroke is considered less likely.  Not a tpa candidate due to: low suspicion for stroke, non-disabling symptoms, low nihss Not an NIR candidate due to: as above.   Comments:   [facility] contacted: [id number] [facility] at bedside: 1519 NIHSS assessment time: 1520  Recommendations:   Supportive care post overdose Psychiatry will likely be helpful Further inpatient evaluation as per Internal Medicine Should new focal neurological deficits present themselves, would reconsult neurology  Discussed with ED MD  History of Present Illness   Patient is a  84 yo F who lives in a facility with her sister.  They had an argument this morning and she left for her room. Her sister checked on her about 1.5 hours later and found her with slurred speech.  She admitted at that point to taking the remainder of her metformin and glipizide. She thinks she took others but is not sure.  HCT is unremarkable.   Exam   NIHSS score:1 1A: Level of Consciousness - Alert; keenly responsive  1B: Ask Month and Age - Both Questions Right  1C: 'Blink Eyes' & 'Squeeze Hands' - Performs Both Tasks  2: Test Horizontal Extraocular Movements - Normal  3: Test Visual Fields - No Visual Loss  4: Test Facial Palsy - Normal symmetry 5A: Test Left Arm Motor Drift - No Drift for 10 Seconds  5B: Test Right Arm Motor Drift - No Drift for 10 Seconds 6A: Test Left Leg Motor Drift - No Drift for 5 Seconds  6B: Test Right Leg Motor Drift - No Drift for 5 Seconds  7: Test Limb Ataxia - No Ataxia  8: Test Sensation - Normal; No sensory loss  9: Test Language/Aphasia - Normal; No aphasia  10: Test Dysarthria - Mild-Moderate Dysarthria: Slurring but can be understood  11: Test Extinction/Inattention - No  abnormality   Medical Decision Making:  - Extensive number of diagnosis or management options are considered above.   - Extensive amount of complex data reviewed.   - High risk of complication and/or morbidity or mortality are associated with differential diagnostic considerations above.  - There may be Uncertain outcome and increased probability of prolonged functional impairment or high probability of severe prolonged functional impairment associated with some of these differential diagnosis.  Medical Data Reviewed:  1.Data reviewed include clinical labs, radiology,  Medical Tests;   2.Tests results discussed w/performing or interpreting physician;   3.Obtaining/reviewing old medical records;  4.Obtaining case history from another source;  5.Independent review of image, tracing or specimen.

## 2017-10-31 NOTE — Progress Notes (Signed)
Patient admitted to the floor from ED, alert and responsive, denies any pain, family at bedside, patient suicidal precaution 1: 1 sitter at bedside, no abnormal behavior noted , at this time

## 2017-10-31 NOTE — Progress Notes (Signed)
Chaplain was call for a code. Chaplain reported and was by family there was no need. Chaplain let them know Chaplain is available for support    10/31/17 1500  Clinical Encounter Type  Visited With Family  Visit Type Initial  Referral From Chaplain  Spiritual Encounters  Spiritual Needs Emotional

## 2017-10-31 NOTE — Consult Note (Signed)
[facility] Consult   Reason for Consult: Consult for 84 year old woman brought in after taking an overdose of prescription medicine Referring Physician:  Pyreddy Patient Identification: Lisa Campbell MRN:  [id number] Principal Diagnosis: Severe major depression, single episode, without psychotic features [facility]) Diagnosis:   Patient Active Problem List   Diagnosis Date Noted  . Altered mental status [R41.82] 10/31/2017  . Suicide attempt ([place]) [T14.91XA] 10/31/2017  . Severe major depression, single episode, without psychotic features ([place]) [F32.2] 10/31/2017  . HTN (hypertension) [I10] 09/30/2017  . GERD (gastroesophageal reflux disease) [K21.9] 09/30/2017  . Gout [M10.9] 09/30/2017  . Controlled type 2 diabetes mellitus without complication, without long-term current use of insulin ([place]) [E11.9] 09/27/2017  . Vitamin D deficiency [E55.9] 09/27/2017  . History of skin cancer [Z85.828] 09/27/2017    Total Time spent with patient: 1 hour  Subjective:   Lisa Campbell is a 84 y.o. female patient admitted with "I just feel useless".  HPI: Patient interviewed chart reviewed.  84 year old woman with a unknown past psychiatric history comes into the hospital after taking an overdose of her diabetes medicine.  According to the collateral history from her family she and her sister had an argument this afternoon after which the patient locked herself in her bedroom.  When the door was broken through patient was showing altered mental status and had taken a large overdose of her oral diabetes medicines.  Reportedly patient stated here in the emergency room that she did it in order to die.  Patient tells me that she has not been feeling well ever since she moved down to [place] in January.  She wishes she had stayed in [place].  Her sleep habits are poor.  She is not eating well.  She feels tired all the time.  Does not feel like she has anything to do.  Seems to feel a great  deal of regret.  She denies it when I ask her directly if she had had thoughts about killing herself but it seems obvious from her behavior that this was an impulsive suicide attempt.  Patient is apparently not receiving any mental health treatment.  Social history: Has no children of her own.  Currently living with her sister in an apartment.  Had been living independently in [place] until a couple months ago.  Medical history: Diabetes controlled with oral medicine.  History of gout history of skin cancer  Substance abuse history: Says she drinks a very small drink of liquor every night but claims that alcohol has never been any sort of a problem for her denies any other drug use.  Past Psychiatric History: Patient denies ever seeing a psychiatrist therapist or mental health provider in the past.  Denies ever being on any medicine for depression or anxiety.  Denies any past suicide attempts.  Risk to Self:   Risk to Others:   Prior Inpatient Therapy:   Prior Outpatient Therapy:    Past Medical History:  Past Medical History:  Diagnosis Date  . Arthritis   . Cancer ([place])    skin mohs left ear ? 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 Colon polyps   . Gastric ulcer   . GERD (gastroesophageal reflux disease)   . Hyperlipidemia   . Hypertension   . Rotator cuff disorder    right  . Shingles     Past Surgical History:  Procedure Laterality Date  . KNEE ARTHROSCOPY  b/l    Family History:  Family History  Problem Relation Age of Onset  . Cancer Mother        ?colon   . Diabetes Mother   . Early death Mother   . Heart disease Mother   . Hypertension Mother   . Hyperlipidemia Mother   . Cancer Father        ? MM  . Diabetes Sister   . Diabetes Brother   . Arthritis Brother    Family Psychiatric  History: None known Social History:  Social History   Substance and Sexual Activity  Alcohol Use Yes     Social History   Substance and Sexual  Activity  Drug Use No    Social History   Socioeconomic History  . Marital status: Single    Spouse name: None  . Number of children: None  . Years of education: None  . Highest education level: None  Social Needs  . Financial resource strain: None  . Food insecurity - worry: None  . Food insecurity - inability: None  . Transportation needs - medical: None  . Transportation needs - non-medical: None  Occupational History  . Occupation: retired  Tobacco Use  . Smoking status: Never Smoker  . Smokeless tobacco: Never Used  Substance and Sexual Activity  . Alcohol use: Yes  . Drug use: No  . Sexual activity: No  Other Topics Concern  . None  Social History Narrative   Retired former Lawyer, assoc. Degree    Moved from [place]    Wears seat belts, no guns at home    No exercise    Additional Social History:    Allergies:   Allergies  Allergen Reactions  . Penicillins     ?rxn   . Statins     ? Rxn cant tolerate crestor and lipitor noted prev records.     Labs:  Results for orders placed or performed during the hospital encounter of 10/31/17 (from the past 48 hour(s))  Glucose, capillary     Status: Abnormal   Collection Time: 10/31/17  2:12 PM  Result Value Ref Range   Glucose-Capillary 172 (H) 65 - 99 mg/dL  Urine Drug Screen, Qualitative ([facility] only)     Status: None   Collection Time: 10/31/17  2:18 PM  Result Value Ref Range   Tricyclic, Ur Screen NONE DETECTED NONE DETECTED   Amphetamines, Ur Screen NONE DETECTED NONE DETECTED   MDMA (Ecstasy)Ur Screen NONE DETECTED NONE DETECTED   Cocaine Metabolite,Ur [place] NONE DETECTED NONE DETECTED   Opiate, Ur Screen NONE DETECTED NONE DETECTED   Phencyclidine (PCP) Ur S NONE DETECTED NONE DETECTED   Cannabinoid 50 Ng, Ur [place] NONE DETECTED NONE DETECTED   Barbiturates, Ur Screen NONE DETECTED NONE DETECTED   Benzodiazepine, Ur Scrn NONE DETECTED NONE DETECTED   Methadone Scn, Ur NONE DETECTED NONE  DETECTED    Comment: (NOTE) Tricyclics + metabolites, urine    Cutoff 1000 ng/mL Amphetamines + metabolites, urine  Cutoff 1000 ng/mL MDMA (Ecstasy), urine              Cutoff 500 ng/mL Cocaine Metabolite, urine          Cutoff 300 ng/mL Opiate + metabolites, urine        Cutoff 300 ng/mL Phencyclidine (PCP), urine         Cutoff 25 ng/mL Cannabinoid, urine  Cutoff 50 ng/mL Barbiturates + metabolites, urine  Cutoff 200 ng/mL Benzodiazepine, urine              Cutoff 200 ng/mL Methadone, urine                   Cutoff 300 ng/mL The urine drug screen provides only a preliminary, unconfirmed analytical test result and should not be used for non-medical purposes. Clinical consideration and professional judgment should be applied to any positive drug screen result due to possible interfering substances. A more specific alternate chemical method must be used in order to obtain a confirmed analytical result. Gas chromatography / mass spectrometry (GC/MS) is the preferred confirmat ory method. Performed at [facility], [address] [place], [place] [address]   Urinalysis, Complete w Microscopic     Status: Abnormal   Collection Time: 10/31/17  2:18 PM  Result Value Ref Range   Color, Urine YELLOW (A) YELLOW   APPearance CLEAR (A) CLEAR   Specific Gravity, Urine 1.008 1.005 - 1.030   pH 6.0 5.0 - 8.0   Glucose, UA 50 (A) NEGATIVE mg/dL   Hgb urine dipstick NEGATIVE NEGATIVE   Bilirubin Urine NEGATIVE NEGATIVE   Ketones, ur NEGATIVE NEGATIVE mg/dL   Protein, ur 30 (A) NEGATIVE mg/dL   Nitrite NEGATIVE NEGATIVE   Leukocytes, UA NEGATIVE NEGATIVE   RBC / HPF NONE SEEN 0 - 5 RBC/hpf   WBC, UA 0-5 0 - 5 WBC/hpf   Bacteria, UA RARE (A) NONE SEEN   Squamous Epithelial / LPF 0-5 (A) NONE SEEN   Mucus PRESENT     Comment: Performed at [facility], [address] [place], [place] [address]  Pregnancy, urine POC     Status: None   Collection Time:  10/31/17  2:23 PM  Result Value Ref Range   Preg Test, Ur NEGATIVE NEGATIVE    Comment:        THE SENSITIVITY OF THIS METHODOLOGY IS >24 mIU/mL   Blood gas, venous     Status: None (Preliminary result)   Collection Time: 10/31/17  2:36 PM  Result Value Ref Range   pH, Ven 7.32 7.250 - 7.430   pCO2, Ven 44 44.0 - 60.0 mmHg   pO2, Ven PENDING 32.0 - 45.0 mmHg   Patient temperature 37.0    Collection site VENOUS    Sample type VENOUS     Comment: Performed at [facility], [address] [place], [place] [address]  Lactic acid, plasma     Status: Abnormal   Collection Time: 10/31/17  2:40 PM  Result Value Ref Range   Lactic Acid, Venous 5.2 (HH) 0.5 - 1.9 mmol/L    Comment: CRITICAL RESULT CALLED TO, READ BACK BY AND VERIFIED WITH CASSIE [employer] @1536  10/31/17 AKT Performed at [facility], [address] [place], [place] [address]   Acetaminophen level     Status: Abnormal   Collection Time: 10/31/17  2:40 PM  Result Value Ref Range   Acetaminophen (Tylenol), Serum <10 (L) 10 - 30 ug/mL    Comment:        THERAPEUTIC CONCENTRATIONS VARY SIGNIFICANTLY. A RANGE OF 10-30 ug/mL MAY BE AN EFFECTIVE CONCENTRATION FOR MANY PATIENTS. HOWEVER, SOME ARE BEST TREATED AT CONCENTRATIONS OUTSIDE THIS RANGE. ACETAMINOPHEN CONCENTRATIONS >150 ug/mL AT 4 HOURS AFTER INGESTION AND >50 ug/mL AT 12 HOURS AFTER INGESTION ARE OFTEN ASSOCIATED WITH TOXIC REACTIONS. Performed at [facility], [address], [place],  [address]  CBC with Differential/Platelet     Status: Abnormal   Collection Time: 10/31/17  2:40 PM  Result Value Ref Range   WBC 12.3 (H) 3.6 - 11.0 K/uL   RBC 3.68 (L) 3.80 - 5.20 MIL/uL   Hemoglobin 11.9 (L) 12.0 - 16.0 g/dL   HCT 35.6 35.0 - 47.0 %   MCV 96.8 80.0 - 100.0 fL   MCH 32.3 26.0 - 34.0 pg   MCHC 33.4 32.0 - 36.0 g/dL   RDW 12.6 11.5 - 14.5 %   Platelets 327 150 - 440 K/uL   Neutrophils Relative % 84 %   Neutro Abs  10.3 (H) 1.4 - 6.5 K/uL   Lymphocytes Relative 9 %   Lymphs Abs 1.1 1.0 - 3.6 K/uL   Monocytes Relative 6 %   Monocytes Absolute 0.7 0.2 - 0.9 K/uL   Eosinophils Relative 0 %   Eosinophils Absolute 0.0 0 - 0.7 K/uL   Basophils Relative 1 %   Basophils Absolute 0.1 0 - 0.1 K/uL    Comment: Performed at [facility], [address] [place], [place] [address]  Comprehensive metabolic panel     Status: Abnormal   Collection Time: 10/31/17  2:40 PM  Result Value Ref Range   Sodium 139 135 - 145 mmol/L   Potassium 3.9 3.5 - 5.1 mmol/L   Chloride 105 101 - 111 mmol/L   CO2 20 (L) 22 - 32 mmol/L   Glucose, Bld 167 (H) 65 - 99 mg/dL   BUN 16 6 - 20 mg/dL   Creatinine, Ser 1.36 (H) 0.44 - 1.00 mg/dL   Calcium 8.8 (L) 8.9 - 10.3 mg/dL   Total Protein 6.6 6.5 - 8.1 g/dL   Albumin 3.7 3.5 - 5.0 g/dL   AST 30 15 - 41 U/L   ALT 13 (L) 14 - 54 U/L   Alkaline Phosphatase 59 38 - 126 U/L   Total Bilirubin 1.9 (H) 0.3 - 1.2 mg/dL   GFR calc non Af Amer 34 (L) >60 mL/min   GFR calc Af Amer 39 (L) >60 mL/min    Comment: (NOTE) The eGFR has been calculated using the CKD EPI equation. This calculation has not been validated in all clinical situations. eGFR's persistently <60 mL/min signify possible Chronic Kidney Disease.    Anion gap 14 5 - 15    Comment: Performed at [facility], [address] [place], [place] [address]  Salicylate level     Status: None   Collection Time: 10/31/17  2:40 PM  Result Value Ref Range   Salicylate Lvl <0.9 2.8 - 30.0 mg/dL    Comment: Performed at [facility], [address] [place], [place] [address]    Current Facility-Administered Medications  Medication Dose Route Frequency Provider Last Rate Last Dose  . hydrALAZINE (APRESOLINE) injection 10 mg  10 mg Intravenous Q4H PRN Pyreddy, Pavan, MD      . losartan (COZAAR) tablet 50 mg  50 mg Oral Daily Pyreddy, Reatha Harps, MD        Musculoskeletal: Strength & Muscle Tone:  decreased Gait & Station: unable to stand Patient leans: Backward  Psychiatric Specialty Exam: Physical Exam  Nursing note and vitals reviewed. Constitutional: She appears well-developed and well-nourished.  HENT:  Head: Normocephalic and atraumatic.  Eyes: Conjunctivae are normal. Pupils are equal, round, and reactive to light.  Neck: Normal range of motion.  Cardiovascular: Regular rhythm and normal heart sounds.  Respiratory: Effort normal. No respiratory distress.  GI: Soft.  Musculoskeletal: Normal range of motion.  Neurological: She is alert.  Skin: Skin is warm and dry.  Psychiatric: Her affect is blunt. Her speech is delayed and tangential. She is slowed. Cognition and memory are impaired. She expresses impulsivity. She exhibits a depressed mood. She expresses suicidal ideation.    Review of Systems  Constitutional: Positive for malaise/fatigue.  HENT: Negative.   Eyes: Negative.   Respiratory: Negative.   Cardiovascular: Negative.   Gastrointestinal: Negative.   Musculoskeletal: Negative.   Skin: Negative.   Neurological: Positive for weakness.  Psychiatric/Behavioral: Positive for depression, memory loss and suicidal ideas. Negative for hallucinations and substance abuse. The patient is nervous/anxious and has insomnia.     Blood pressure (!) 147/77, pulse 78, temperature (!) 97.1 F (36.2 C), temperature source Oral, resp. rate 18, height 5' 2"  (1.575 m), weight 80.3 kg (177 lb), SpO2 98 %.Body mass index is 32.37 kg/m.  General Appearance: Casual  Eye Contact:  Fair  Speech:  Slow  Volume:  Decreased  Mood:  Depressed and Dysphoric  Affect:  Blunt  Thought Process:  Disorganized  Orientation:  Other:  Waxes and wanes a little bit on this.  Initially knew where she was later on told me that she was in an old people's home.  Thought Content:  Rumination and Tangential  Suicidal Thoughts:  Yes.  with intent/plan  Homicidal Thoughts:  No  Memory:  Immediate;    Fair Recent;   Fair Remote;   Fair  Judgement:  Impaired  Insight:  Shallow  Psychomotor Activity:  Decreased  Concentration:  Concentration: Poor  Recall:  Poor  Fund of Knowledge:  Fair  Language:  Fair  Akathisia:  No  Handed:  Right  AIMS (if indicated):     Assets:  Housing Social Support  ADL's:  Impaired  Cognition:  Impaired,  Mild  Sleep:        Treatment Plan Summary: Plan 84 year old woman who took an intentional overdose of prescription medicine with suicidal intent.  Multiple symptoms of major depression.  Also a little cognitively impaired right now.  Slightly in and out in her thinking.  Not sure if she has much dementia at baseline or if some of this is still just the effects of the medicine.  Patient definitely however should remain under involuntary commitment.  We will recommend that she be referred to geriatric psychiatry hospital once she is medically stabilized.  I am deferring starting any anti-depressant medicine until she is on the medical floor and able lysed there.  Disposition: Recommend psychiatric Inpatient admission when medically cleared. Supportive therapy provided about ongoing stressors.  Alethia Berthold, MD 10/31/2017 5:47 PM

## 2017-10-31 NOTE — ED Notes (Signed)
Pt states " I took them to die." referring to the medication. RN will continue to monitor.

## 2017-10-31 NOTE — ED Notes (Signed)
LAB OBTAINED
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
## 2017-10-31 NOTE — Progress Notes (Signed)
Patty from poison control called, updated her with labs and pt condition. Requesting to add a magnesium level, MD paged for request.
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
## 2017-10-31 NOTE — ED Notes (Signed)
Code stroke called to [phone]

## 2017-11-01 ENCOUNTER — Other Ambulatory Visit: Payer: Self-pay

## 2017-11-01 LAB — LACTIC ACID, PLASMA
LACTIC ACID, VENOUS: 9.7 mmol/L — AB (ref 0.5–1.9)
LACTIC ACID, VENOUS: 8.6 mmol/L — AB (ref 0.5–1.9)
Lactic Acid, Venous: 9 mmol/L (ref 0.5–1.9)
Lactic Acid, Venous: 6.2 mmol/L (ref 0.5–1.9)
Lactic Acid, Venous: 4.3 mmol/L (ref 0.5–1.9)
Lactic Acid, Venous: 4.6 mmol/L (ref 0.5–1.9)

## 2017-11-01 LAB — CBC
HCT: 37.9 % (ref 35.0–47.0)
Hemoglobin: 12.3 g/dL (ref 12.0–16.0)
MCH: 32.2 pg (ref 26.0–34.0)
MCHC: 32.5 g/dL (ref 32.0–36.0)
MCV: 99.1 fL (ref 80.0–100.0)
PLATELETS: 296 10*3/uL (ref 150–440)
RBC: 3.83 MIL/uL (ref 3.80–5.20)
RDW: 13.4 % (ref 11.5–14.5)
WBC: 13.4 10*3/uL — AB (ref 3.6–11.0)

## 2017-11-01 LAB — GLUCOSE, CAPILLARY
GLUCOSE-CAPILLARY: 107 mg/dL — AB (ref 65–99)
GLUCOSE-CAPILLARY: 118 mg/dL — AB (ref 65–99)
GLUCOSE-CAPILLARY: 118 mg/dL — AB (ref 65–99)
GLUCOSE-CAPILLARY: 125 mg/dL — AB (ref 65–99)
GLUCOSE-CAPILLARY: 130 mg/dL — AB (ref 65–99)
Glucose-Capillary: 102 mg/dL — ABNORMAL HIGH (ref 65–99)
Glucose-Capillary: 109 mg/dL — ABNORMAL HIGH (ref 65–99)
Glucose-Capillary: 109 mg/dL — ABNORMAL HIGH (ref 65–99)
Glucose-Capillary: 109 mg/dL — ABNORMAL HIGH (ref 65–99)
Glucose-Capillary: 112 mg/dL — ABNORMAL HIGH (ref 65–99)
Glucose-Capillary: 115 mg/dL — ABNORMAL HIGH (ref 65–99)
Glucose-Capillary: 126 mg/dL — ABNORMAL HIGH (ref 65–99)

## 2017-11-01 LAB — BASIC METABOLIC PANEL
ANION GAP: 22 — AB (ref 5–15)
BUN: 21 mg/dL — AB (ref 6–20)
CALCIUM: 8.3 mg/dL — AB (ref 8.9–10.3)
CO2: 14 mmol/L — ABNORMAL LOW (ref 22–32)
Chloride: 106 mmol/L (ref 101–111)
Creatinine, Ser: 1.7 mg/dL — ABNORMAL HIGH (ref 0.44–1.00)
GFR calc Af Amer: 30 mL/min — ABNORMAL LOW (ref >60)
GFR, EST NON AFRICAN AMERICAN: 26 mL/min — AB (ref >60)
GLUCOSE: 134 mg/dL — AB (ref 65–99)
Potassium: 5.3 mmol/L — ABNORMAL HIGH (ref 3.5–5.1)
SODIUM: 142 mmol/L (ref 135–145)

## 2017-11-01 LAB — MAGNESIUM: MAGNESIUM: 2.2 mg/dL (ref 1.7–2.4)

## 2017-11-01 MED ORDER — SODIUM BICARBONATE 8.4 % IV SOLN
INTRAVENOUS | Status: AC
  Administered 2017-11-01 (×2): via INTRAVENOUS
  Filled 2017-11-01 (×2): qty 150

## 2017-11-01 MED ORDER — SODIUM CHLORIDE 0.9 % IV BOLUS (SEPSIS)
1000.0000 mL | Freq: Once | INTRAVENOUS | Status: AC
  Administered 2017-11-01: 1000 mL via INTRAVENOUS

## 2017-11-01 NOTE — Progress Notes (Signed)
[facility] [facility] [place] NAME: Lisa Campbell    MR#:  [id number]  DATE OF BIRTH:  [birth year]  SUBJECTIVE:   Patient admitted to the hospital secondary to drug overdose from metformin and Januvia. Noted to be in lactic acidosis due to metformin toxicity. Blood sugars are stable, patient denies any nausea vomiting or any other associated symptoms. Lactic acid is improving. Continue bicarbonate drip.  REVIEW OF SYSTEMS:    Review of Systems  Constitutional: Negative for chills and fever.  HENT: Negative for congestion and tinnitus.   Eyes: Negative for blurred vision and double vision.  Respiratory: Negative for cough, shortness of breath and wheezing.   Cardiovascular: Negative for chest pain, orthopnea and PND.  Gastrointestinal: Negative for abdominal pain, diarrhea, nausea and vomiting.  Genitourinary: Negative for dysuria and hematuria.  Neurological: Negative for dizziness, sensory change and focal weakness.  All other systems reviewed and are negative.   Nutrition: Heart Healthy/Carb control Tolerating Diet: Yes Tolerating PT: Await Eval.   DRUG ALLERGIES:   Allergies  Allergen Reactions  . Penicillins     ?rxn   . Statins     ? Rxn cant tolerate crestor and lipitor noted prev records.     VITALS:  Blood pressure (!) 117/42, pulse 79, temperature 98.4 F (36.9 C), temperature source Oral, resp. rate 16, height 5\' 2"  (1.575 m), weight 80.3 kg (177 lb), SpO2 97 %.  PHYSICAL EXAMINATION:   Physical Exam  GENERAL:  84 y.o.-year-old patient lying in bed in no acute distress.  EYES: Pupils equal, round, reactive to light and accommodation. No scleral icterus. Extraocular muscles intact.  HEENT: Head atraumatic, normocephalic. Oropharynx and nasopharynx clear.  NECK:  Supple, no jugular venous distention. No thyroid enlargement, no tenderness.  LUNGS: Normal breath sounds bilaterally, no wheezing, rales, rhonchi. No use of accessory  muscles of respiration.  CARDIOVASCULAR: S1, S2 normal. No murmurs, rubs, or gallops.  ABDOMEN: Soft, nontender, nondistended. Bowel sounds present. No organomegaly or mass.  EXTREMITIES: No cyanosis, clubbing or edema b/l.    NEUROLOGIC: Cranial nerves II through XII are intact. No focal Motor or sensory deficits b/l.   PSYCHIATRIC: The patient is alert and oriented x 3.  SKIN: No obvious rash, lesion, or ulcer.    LABORATORY PANEL:   CBC Recent Labs  Lab 11/01/17 0556  WBC 13.4*  HGB 12.3  HCT 37.9  PLT 296   ------------------------------------------------------------------------------------------------------------------  Chemistries  Recent Labs  Lab 10/31/17 1440 11/01/17 0556  NA 139 142  K 3.9 5.3*  CL 105 106  CO2 20* 14*  GLUCOSE 167* 134*  BUN 16 21*  CREATININE 1.36* 1.70*  CALCIUM 8.8* 8.3*  MG 1.2* 2.2  AST 30  --   ALT 13*  --   ALKPHOS 59  --   BILITOT 1.9*  --    ------------------------------------------------------------------------------------------------------------------  Cardiac Enzymes No results for input(s): TROPONINI in the last 168 hours. ------------------------------------------------------------------------------------------------------------------  RADIOLOGY:  Dg Chest Portable 1 View  Result Date: 10/31/2017 CLINICAL DATA:  Evaluate for aspiration. EXAM: PORTABLE CHEST 1 VIEW COMPARISON:  None. FINDINGS: The heart size and mediastinal contours are within normal limits. Both lungs are clear. The visualized skeletal structures are unremarkable. IMPRESSION: No active disease. Electronically Signed   By: Titus Dubin M.D.   On: 10/31/2017 14:44   Ct Head Code Stroke Wo Contrast  Result Date: 10/31/2017 CLINICAL DATA:  Code stroke. Altered level of consciousness. Code stroke. Slurred speech. Drug overdose.  EXAM: CT HEAD WITHOUT CONTRAST TECHNIQUE: Contiguous axial images were obtained from the base of the skull through the vertex  without intravenous contrast. COMPARISON:  None. FINDINGS: Brain: Mild atrophy and white matter changes are present bilaterally. Basal ganglia are intact. Insular ribbon is normal bilaterally. No acute infarct, hemorrhage, or mass lesion is present. The ventricles are proportionate to the degree of atrophy. No significant extra-axial fluid collection is present. Vascular: Atherosclerotic calcifications are present in the cavernous internal carotid arteries bilaterally. There is no hyperdense vessel. Skull: Calvarium is intact. No focal lytic or blastic lesions are present. Sinuses/Orbits: Paranasal sinuses and mastoid air cells are clear. Bilateral lens replacements are present. Globes and orbits are within normal limits. ASPECTS [facility] Stroke Program Early CT Score) - Ganglionic level infarction (caudate, lentiform nuclei, internal capsule, insula, M1-M3 cortex): 7/7 - Supraganglionic infarction (M4-M6 cortex): 3/3 Total score (0-10 with 10 being normal): 10/10 IMPRESSION: 1. No acute intracranial abnormality. 2. Mild atrophy and white matter disease likely reflects the sequela of chronic microvascular ischemia. 3. ASPECTS is 10/10 These results were called by telephone at the time of interpretation on 10/31/2017 at 3:24 pm to Dr. Merlyn Lot , who verbally acknowledged these results. Electronically Signed   By: San Morelle M.D.   On: 10/31/2017 15:25     ASSESSMENT AND PLAN:   84 year old female with past medical history of diabetes, hypertension, hyperlipidemia, history of osteoarthritis, GERD who presented to the hospital due to drug overdose with increasing intake of metformin, Januvia.  1. Metformin toxicity-patient took increasing amounts of metformin. -Patient noted to have lactic acidosis. Seen by nephrology, continue bicarbonate drip and they would consider dialysis but patient wants to think about it. -Blood sugar stable, follow lactic acid and BS  2. Lactic acidosis-secondary  to metformin toxicity. Continue bicarbonate drip, follow lactic acid. -May need dialysis but patient is still considering this.  3. Depression with suicide attempt-patient took multiple pills of metformin and Januvia as a suicide attempt. -Seen by psychiatry, continue one-to-one sitter, discussed with Dr. Weber Cooks and patient may need a Geri psychiatric unit placement upon discharge.  4. Acute kidney injury-secondary to dehydration and metformin toxicity. -Continue bicarbonate drip, follow BUN and creatinine.  5. Essential HTN - cont. Losartan, PRN Hydralazine.    All the records are reviewed and case discussed with Care Management/Social Worker. Management plans discussed with the patient, family and they are in agreement.  CODE STATUS: DNR  DVT Prophylaxis: Lovenox  TOTAL TIME TAKING CARE OF THIS PATIENT: 30 minutes.   POSSIBLE D/C IN 1-2 DAYS, DEPENDING ON CLINICAL CONDITION.   Henreitta Leber M.D on 11/01/2017 at 2:52 PM  Between 7am to 6pm - Pager - [phone]  After 6pm go to [email or web address] - Proofreader  [facility]  Office  [phone]  CC: Primary care physician; McLean-Scocuzza, Nino Glow, MD

## 2017-11-01 NOTE — Consult Note (Signed)
[facility] Consult   Reason for Consult: Follow-up consult for this 84 year old woman who took a large overdose of her prescription medication yesterday in a suicide attempt Referring Physician: Verdell Carmine Patient Identification: Lisa Campbell MRN:  [id number] Principal Diagnosis: Severe major depression, single episode, without psychotic features [facility] Diagnosis:   Patient Active Problem List   Diagnosis Date Noted  . Altered mental status [R41.82] 10/31/2017  . Suicide attempt ([place]) [T14.91XA] 10/31/2017  . Severe major depression, single episode, without psychotic features ([place]) [F32.2] 10/31/2017  . HTN (hypertension) [I10] 09/30/2017  . GERD (gastroesophageal reflux disease) [K21.9] 09/30/2017  . Gout [M10.9] 09/30/2017  . Controlled type 2 diabetes mellitus without complication, without long-term current use of insulin ([place]) [E11.9] 09/27/2017  . Vitamin D deficiency [E55.9] 09/27/2017  . History of skin cancer [Z85.828] 09/27/2017    Total Time spent with patient: 30 minutes  Subjective:   Lisa Campbell is a 84 y.o. female patient admitted with "I guess I am feeling better".  HPI: Patient interviewed chart reviewed.  See my note from yesterday.  84 year old woman presented to the emergency room after taking an overdose of Januvia and metformin in what she admitted was a suicide attempt.  Patient not only took the overdose but locked herself in her room at the time that she did it.  She endorsed multiple symptoms of depression.  On interview today the patient is awake alert and able to sit up.  Patient does not deny being depressed and unhappy but rejects the idea that she needs psychiatric treatment without frankly denying that she is suicidal.  Reviewed the medical notes.  It looks like the follow-up from the overdose is still being sorted out metabolically.  Past Psychiatric History: Patient evidently has no past history of any significant psychiatric  treatment  Risk to Self: Is patient at risk for suicide?: Yes Risk to Others:   Prior Inpatient Therapy:   Prior Outpatient Therapy:    Past Medical History:  Past Medical History:  Diagnosis Date  . Arthritis   . Cancer ([place])    skin mohs left ear ? 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 Colon polyps   . Gastric ulcer   . GERD (gastroesophageal reflux disease)   . Hyperlipidemia   . Hypertension   . Rotator cuff disorder    right  . Shingles     Past Surgical History:  Procedure Laterality Date  . KNEE ARTHROSCOPY     b/l    Family History:  Family History  Problem Relation Age of Onset  . Cancer Mother        ?colon   . Diabetes Mother   . Early death Mother   . Heart disease Mother   . Hypertension Mother   . Hyperlipidemia Mother   . Cancer Father        ? MM  . Diabetes Sister   . Diabetes Brother   . Arthritis Brother    Family Psychiatric  History: None known Social History:  Social History   Substance and Sexual Activity  Alcohol Use Yes     Social History   Substance and Sexual Activity  Drug Use No    Social History   Socioeconomic History  . Marital status: Single    Spouse name: None  . Number of children: None  . Years of education: None  . Highest education level: None  Social Needs  . Financial resource strain: None  .  Food insecurity - worry: None  . Food insecurity - inability: None  . Transportation needs - medical: None  . Transportation needs - non-medical: None  Occupational History  . Occupation: retired  Tobacco Use  . Smoking status: Never Smoker  . Smokeless tobacco: Never Used  Substance and Sexual Activity  . Alcohol use: Yes  . Drug use: No  . Sexual activity: No  Other Topics Concern  . None  Social History Narrative   Retired former Lawyer, assoc. Degree    Moved from [place]    Wears seat belts, no guns at home    No exercise    Additional Social History:     Allergies:   Allergies  Allergen Reactions  . Penicillins     ?rxn   . Statins     ? Rxn cant tolerate crestor and lipitor noted prev records.     Labs:  Results for orders placed or performed during the hospital encounter of 10/31/17 (from the past 48 hour(s))  Glucose, capillary     Status: Abnormal   Collection Time: 10/31/17  2:12 PM  Result Value Ref Range   Glucose-Capillary 172 (H) 65 - 99 mg/dL  Urine Drug Screen, Qualitative ([facility] only)     Status: None   Collection Time: 10/31/17  2:18 PM  Result Value Ref Range   Tricyclic, Ur Screen NONE DETECTED NONE DETECTED   Amphetamines, Ur Screen NONE DETECTED NONE DETECTED   MDMA (Ecstasy)Ur Screen NONE DETECTED NONE DETECTED   Cocaine Metabolite,Ur [place] NONE DETECTED NONE DETECTED   Opiate, Ur Screen NONE DETECTED NONE DETECTED   Phencyclidine (PCP) Ur S NONE DETECTED NONE DETECTED   Cannabinoid 50 Ng, Ur [place] Day NONE DETECTED NONE DETECTED   Barbiturates, Ur Screen NONE DETECTED NONE DETECTED   Benzodiazepine, Ur Scrn NONE DETECTED NONE DETECTED   Methadone Scn, Ur NONE DETECTED NONE DETECTED    Comment: (NOTE) Tricyclics + metabolites, urine    Cutoff 1000 ng/mL Amphetamines + metabolites, urine  Cutoff 1000 ng/mL MDMA (Ecstasy), urine              Cutoff 500 ng/mL Cocaine Metabolite, urine          Cutoff 300 ng/mL Opiate + metabolites, urine        Cutoff 300 ng/mL Phencyclidine (PCP), urine         Cutoff 25 ng/mL Cannabinoid, urine                 Cutoff 50 ng/mL Barbiturates + metabolites, urine  Cutoff 200 ng/mL Benzodiazepine, urine              Cutoff 200 ng/mL Methadone, urine                   Cutoff 300 ng/mL The urine drug screen provides only a preliminary, unconfirmed analytical test result and should not be used for non-medical purposes. Clinical consideration and professional judgment should be applied to any positive drug screen result due to possible interfering substances. A more specific alternate  chemical method must be used in order to obtain a confirmed analytical result. Gas chromatography / mass spectrometry (GC/MS) is the preferred confirmat ory method. Performed at [facility], [address] [place], [place] [address]   Urinalysis, Complete w Microscopic     Status: Abnormal   Collection Time: 10/31/17  2:18 PM  Result Value Ref Range   Color, Urine YELLOW (A) YELLOW   APPearance CLEAR (A) CLEAR  Specific Gravity, Urine 1.008 1.005 - 1.030   pH 6.0 5.0 - 8.0   Glucose, UA 50 (A) NEGATIVE mg/dL   Hgb urine dipstick NEGATIVE NEGATIVE   Bilirubin Urine NEGATIVE NEGATIVE   Ketones, ur NEGATIVE NEGATIVE mg/dL   Protein, ur 30 (A) NEGATIVE mg/dL   Nitrite NEGATIVE NEGATIVE   Leukocytes, UA NEGATIVE NEGATIVE   RBC / HPF NONE SEEN 0 - 5 RBC/hpf   WBC, UA 0-5 0 - 5 WBC/hpf   Bacteria, UA RARE (A) NONE SEEN   Squamous Epithelial / LPF 0-5 (A) NONE SEEN   Mucus PRESENT     Comment: Performed at [facility], [address] [place], [place] [address]  Pregnancy, urine POC     Status: None   Collection Time: 10/31/17  2:23 PM  Result Value Ref Range   Preg Test, Ur NEGATIVE NEGATIVE    Comment:        THE SENSITIVITY OF THIS METHODOLOGY IS >24 mIU/mL   Blood gas, venous     Status: None (Preliminary result)   Collection Time: 10/31/17  2:36 PM  Result Value Ref Range   pH, Ven 7.32 7.250 - 7.430   pCO2, Ven 44 44.0 - 60.0 mmHg   pO2, Ven PENDING 32.0 - 45.0 mmHg   Patient temperature 37.0    Collection site VENOUS    Sample type VENOUS     Comment: Performed at [facility], [address] [place], [place] [address]  Lactic acid, plasma     Status: Abnormal   Collection Time: 10/31/17  2:40 PM  Result Value Ref Range   Lactic Acid, Venous 5.2 (HH) 0.5 - 1.9 mmol/L    Comment: CRITICAL RESULT CALLED TO, READ BACK BY AND VERIFIED WITH CASSIE [facility] @1536  10/31/17 AKT Performed at [facility], [address]  [place], [place] [address]   Acetaminophen level     Status: Abnormal   Collection Time: 10/31/17  2:40 PM  Result Value Ref Range   Acetaminophen (Tylenol), Serum <10 (L) 10 - 30 ug/mL    Comment:        THERAPEUTIC CONCENTRATIONS VARY SIGNIFICANTLY. A RANGE OF 10-30 ug/mL MAY BE AN EFFECTIVE CONCENTRATION FOR MANY PATIENTS. HOWEVER, SOME ARE BEST TREATED AT CONCENTRATIONS OUTSIDE THIS RANGE. ACETAMINOPHEN CONCENTRATIONS >150 ug/mL AT 4 HOURS AFTER INGESTION AND >50 ug/mL AT 12 HOURS AFTER INGESTION ARE OFTEN ASSOCIATED WITH TOXIC REACTIONS. Performed at [facility], [address] [place], [place] [address]   CBC with Differential/Platelet     Status: Abnormal   Collection Time: 10/31/17  2:40 PM  Result Value Ref Range   WBC 12.3 (H) 3.6 - 11.0 K/uL   RBC 3.68 (L) 3.80 - 5.20 MIL/uL   Hemoglobin 11.9 (L) 12.0 - 16.0 g/dL   HCT 35.6 35.0 - 47.0 %   MCV 96.8 80.0 - 100.0 fL   MCH 32.3 26.0 - 34.0 pg   MCHC 33.4 32.0 - 36.0 g/dL   RDW 12.6 11.5 - 14.5 %   Platelets 327 150 - 440 K/uL   Neutrophils Relative % 84 %   Neutro Abs 10.3 (H) 1.4 - 6.5 K/uL   Lymphocytes Relative 9 %   Lymphs Abs 1.1 1.0 - 3.6 K/uL   Monocytes Relative 6 %   Monocytes Absolute 0.7 0.2 - 0.9 K/uL   Eosinophils Relative 0 %   Eosinophils Absolute 0.0 0 - 0.7 K/uL   Basophils Relative 1 %   Basophils Absolute 0.1  0 - 0.1 K/uL    Comment: Performed at [facility], [address] [place], [place] [address]  Comprehensive metabolic panel     Status: Abnormal   Collection Time: 10/31/17  2:40 PM  Result Value Ref Range   Sodium 139 135 - 145 mmol/L   Potassium 3.9 3.5 - 5.1 mmol/L   Chloride 105 101 - 111 mmol/L   CO2 20 (L) 22 - 32 mmol/L   Glucose, Bld 167 (H) 65 - 99 mg/dL   BUN 16 6 - 20 mg/dL   Creatinine, Ser 1.36 (H) 0.44 - 1.00 mg/dL   Calcium 8.8 (L) 8.9 - 10.3 mg/dL   Total Protein 6.6 6.5 - 8.1 g/dL   Albumin 3.7 3.5 - 5.0 g/dL   AST 30 15 - 41 U/L   ALT 13 (L)  14 - 54 U/L   Alkaline Phosphatase 59 38 - 126 U/L   Total Bilirubin 1.9 (H) 0.3 - 1.2 mg/dL   GFR calc non Af Amer 34 (L) >60 mL/min   GFR calc Af Amer 39 (L) >60 mL/min    Comment: (NOTE) The eGFR has been calculated using the CKD EPI equation. This calculation has not been validated in all clinical situations. eGFR's persistently <60 mL/min signify possible Chronic Kidney Disease.    Anion gap 14 5 - 15    Comment: Performed at [facility], [address] [place], [place] [address]  Salicylate level     Status: None   Collection Time: 10/31/17  2:40 PM  Result Value Ref Range   Salicylate Lvl <3.4 2.8 - 30.0 mg/dL    Comment: Performed at [facility], [address] [place], [place] [address]  Magnesium     Status: Abnormal   Collection Time: 10/31/17  2:40 PM  Result Value Ref Range   Magnesium 1.2 (L) 1.7 - 2.4 mg/dL    Comment: Performed at [facility], [address] [place], [place] [address]  Lactic acid, plasma     Status: Abnormal   Collection Time: 10/31/17  6:28 PM  Result Value Ref Range   Lactic Acid, Venous 7.6 (HH) 0.5 - 1.9 mmol/L    Comment: CRITICAL RESULT CALLED TO, READ BACK BY AND VERIFIED WITH SIMONE TOURE AT 1906 10/31/2017.  TFK Performed at [facility], [address] [place], [place] [address]   Glucose, capillary     Status: Abnormal   Collection Time: 10/31/17  7:54 PM  Result Value Ref Range   Glucose-Capillary 138 (H) 65 - 99 mg/dL  Blood gas, venous     Status: Abnormal (Preliminary result)   Collection Time: 10/31/17  9:39 PM  Result Value Ref Range   pH, Ven 7.18 (LL) 7.250 - 7.430    Comment: CRITICAL RESULT CALLED TO, READ BACK BY AND VERIFIED WITH: DR. Jannifer Franklin AT 2146 ON 3/12.19 KSL     pCO2, Ven 46 44.0 - 60.0 mmHg   pO2, Ven PENDING 32.0 - 45.0 mmHg   Bicarbonate 17.2 (L) 20.0 - 28.0 mmol/L   Acid-base deficit 10.9 (H) 0.0 - 2.0 mmol/L   O2 Saturation PENDING %   Patient temperature  37.0    Collection site VENOUS    Sample type VENOUS     Comment: Performed at [facility], [address] [place], [place] [address]  Glucose, capillary     Status: Abnormal   Collection Time: 10/31/17 10:02 PM  Result Value Ref Range   Glucose-Capillary 106 (H)  65 - 99 mg/dL  Lactic acid, plasma     Status: Abnormal   Collection Time: 10/31/17 10:29 PM  Result Value Ref Range   Lactic Acid, Venous 8.4 (HH) 0.5 - 1.9 mmol/L    Comment: CRITICAL RESULT CALLED TO, READ BACK BY AND VERIFIED WITH TEQUIERA NESBITT ON 10/31/17 AT 2332 JAG Performed at [facility], [address] [place], [place] [address]   Glucose, capillary     Status: Abnormal   Collection Time: 11/01/17 12:00 AM  Result Value Ref Range   Glucose-Capillary 125 (H) 65 - 99 mg/dL  Lactic acid, plasma     Status: Abnormal   Collection Time: 11/01/17  1:16 AM  Result Value Ref Range   Lactic Acid, Venous 9.7 (HH) 0.5 - 1.9 mmol/L    Comment: CRITICAL RESULT CALLED TO, READ BACK BY AND VERIFIED WITH JASMINE SORIANO 11/01/17 @ 5400  [place] Performed at [facility], [address] [place], [place] [address]   Blood gas, venous     Status: Abnormal (Preliminary result)   Collection Time: 11/01/17  1:16 AM  Result Value Ref Range   pH, Ven 7.19 (LL) 7.250 - 7.430    Comment: CRITICAL RESULT CALLED TO, READ BACK BY AND VERIFIED WITH: DR. DIAMOND AT 0140 ON 11/01/17 KSL    pCO2, Ven 40 (L) 44.0 - 60.0 mmHg   pO2, Ven PENDING 32.0 - 45.0 mmHg   Bicarbonate 15.3 (L) 20.0 - 28.0 mmol/L   Acid-base deficit 12.3 (H) 0.0 - 2.0 mmol/L   O2 Saturation PENDING %   Patient temperature 37.0    Collection site VENOUS    Sample type VENOUS     Comment: Performed at [facility], [address] [place], [place] [address]  Glucose, capillary     Status: Abnormal   Collection Time: 11/01/17  1:57 AM  Result Value Ref Range   Glucose-Capillary 118 (H) 65 - 99 mg/dL   Comment 1 Notify RN    Glucose, capillary     Status: Abnormal   Collection Time: 11/01/17  4:06 AM  Result Value Ref Range   Glucose-Capillary 102 (H) 65 - 99 mg/dL   Comment 1 Notify RN   Lactic acid, plasma     Status: Abnormal   Collection Time: 11/01/17  5:56 AM  Result Value Ref Range   Lactic Acid, Venous 9.0 (HH) 0.5 - 1.9 mmol/L    Comment: CRITICAL RESULT CALLED TO, READ BACK BY AND VERIFIED WITH YASMIN SORIANO AT 2671 ON 11/01/17 [place]. Performed at [facility], [address] [place], [place] [address]   CBC     Status: Abnormal   Collection Time: 11/01/17  5:56 AM  Result Value Ref Range   WBC 13.4 (H) 3.6 - 11.0 K/uL   RBC 3.83 3.80 - 5.20 MIL/uL   Hemoglobin 12.3 12.0 - 16.0 g/dL   HCT 37.9 35.0 - 47.0 %   MCV 99.1 80.0 - 100.0 fL   MCH 32.2 26.0 - 34.0 pg   MCHC 32.5 32.0 - 36.0 g/dL   RDW 13.4 11.5 - 14.5 %   Platelets 296 150 - 440 K/uL    Comment: Performed at [facility], [address], [place],  [address]  Basic metabolic panel     Status: Abnormal   Collection Time: 11/01/17  5:56 AM  Result Value Ref Range   Sodium 142 135 - 145 mmol/L    Comment: ELECTROLYTES REPEATED.[place]   Potassium 5.3 (H)  3.5 - 5.1 mmol/L   Chloride 106 101 - 111 mmol/L   CO2 14 (L) 22 - 32 mmol/L   Glucose, Bld 134 (H) 65 - 99 mg/dL   BUN 21 (H) 6 - 20 mg/dL   Creatinine, Ser 1.70 (H) 0.44 - 1.00 mg/dL   Calcium 8.3 (L) 8.9 - 10.3 mg/dL   GFR calc non Af Amer 26 (L) >60 mL/min   GFR calc Af Amer 30 (L) >60 mL/min    Comment: (NOTE) The eGFR has been calculated using the CKD EPI equation. This calculation has not been validated in all clinical situations. eGFR's persistently <60 mL/min signify possible Chronic Kidney Disease.    Anion gap 22 (H) 5 - 15    Comment: Performed at [facility], [address] [place], [place] [address]  Magnesium     Status: None   Collection Time: 11/01/17  5:56 AM  Result Value Ref Range   Magnesium 2.2 1.7 - 2.4 mg/dL     Comment: Performed at [facility], [address] [place], [place] [address]  Glucose, capillary     Status: Abnormal   Collection Time: 11/01/17  5:57 AM  Result Value Ref Range   Glucose-Capillary 118 (H) 65 - 99 mg/dL  Lactic acid, plasma     Status: Abnormal   Collection Time: 11/01/17  7:52 AM  Result Value Ref Range   Lactic Acid, Venous 8.6 (HH) 0.5 - 1.9 mmol/L    Comment: CRITICAL RESULT CALLED TO, READ BACK BY AND VERIFIED WITH [place] JOHNSTONE AT 4259 11/01/17 DAS Performed at [facility], [address] [place], [place] [address]   Glucose, capillary     Status: Abnormal   Collection Time: 11/01/17  8:00 AM  Result Value Ref Range   Glucose-Capillary 109 (H) 65 - 99 mg/dL  Glucose, capillary     Status: Abnormal   Collection Time: 11/01/17 10:10 AM  Result Value Ref Range   Glucose-Capillary 109 (H) 65 - 99 mg/dL  Lactic acid, plasma     Status: Abnormal   Collection Time: 11/01/17 10:38 AM  Result Value Ref Range   Lactic Acid, Venous 6.2 (HH) 0.5 - 1.9 mmol/L    Comment: CRITICAL RESULT CALLED TO, READ BACK BY AND VERIFIED WITH JANCY JOHNSTONE AT 1114 ON 11/01/17 [place]. Performed at [facility], [address] [place], [place] [address]   Glucose, capillary     Status: Abnormal   Collection Time: 11/01/17 11:58 AM  Result Value Ref Range   Glucose-Capillary 107 (H) 65 - 99 mg/dL  Lactic acid, plasma     Status: Abnormal   Collection Time: 11/01/17  2:01 PM  Result Value Ref Range   Lactic Acid, Venous 4.3 (HH) 0.5 - 1.9 mmol/L    Comment: CRITICAL RESULT CALLED TO, READ BACK BY AND VERIFIED WITH JANCY JONESTONE @1429  11/01/17 AKT Performed at [facility], [address] [place],  [address]   Glucose, capillary     Status: Abnormal   Collection Time: 11/01/17  2:04 PM  Result Value Ref Range   Glucose-Capillary 115 (H) 65 - 99 mg/dL  Glucose, capillary     Status: Abnormal   Collection Time: 11/01/17  3:51 PM   Result Value Ref Range   Glucose-Capillary 112 (H) 65 - 99 mg/dL    Current Facility-Administered Medications  Medication Dose Route Frequency Provider Last Rate Last Dose  . acetaminophen (TYLENOL) tablet 650 mg  650 mg Oral Q6H PRN  Saundra Shelling, MD       Or  . acetaminophen (TYLENOL) suppository 650 mg  650 mg Rectal Q6H PRN Pyreddy, Reatha Harps, MD      . enoxaparin (LOVENOX) injection 30 mg  30 mg Subcutaneous Q24H Pyreddy, Reatha Harps, MD   30 mg at 10/31/17 2228  . hydrALAZINE (APRESOLINE) injection 10 mg  10 mg Intravenous Q4H PRN Pyreddy, Reatha Harps, MD      . ondansetron (ZOFRAN) tablet 4 mg  4 mg Oral Q6H PRN Pyreddy, Reatha Harps, MD       Or  . ondansetron (ZOFRAN) injection 4 mg  4 mg Intravenous Q6H PRN Saundra Shelling, MD   4 mg at 10/31/17 1808  . senna-docusate (Senokot-S) tablet 1 tablet  1 tablet Oral QHS PRN Pyreddy, Pavan, MD      . sodium bicarbonate 150 mEq in dextrose 5 % 1,000 mL infusion   Intravenous Continuous Harrie Foreman, MD 50 mL/hr at 11/01/17 4462      Musculoskeletal: Strength & Muscle Tone: decreased Gait & Station: unsteady Patient leans: N/A  Psychiatric Specialty Exam: Physical Exam  Nursing note and vitals reviewed. Constitutional: She appears well-developed and well-nourished.  HENT:  Head: Normocephalic and atraumatic.  Eyes: Conjunctivae are normal. Pupils are equal, round, and reactive to light.  Neck: Normal range of motion.  Cardiovascular: Regular rhythm and normal heart sounds.  Respiratory: Effort normal. No respiratory distress.  GI: Soft.  Musculoskeletal: Normal range of motion.  Neurological: She is alert.  Skin: Skin is warm and dry.  Psychiatric: Her affect is blunt. Her speech is delayed. She is slowed. Cognition and memory are impaired. She expresses impulsivity. She exhibits a depressed mood. She expresses no suicidal ideation.    Review of Systems  Constitutional: Negative.   HENT: Negative.   Eyes: Negative.   Respiratory:  Negative.   Cardiovascular: Negative.   Gastrointestinal: Negative.   Musculoskeletal: Negative.   Skin: Negative.   Neurological: Negative.   Psychiatric/Behavioral: Positive for depression, memory loss and suicidal ideas. Negative for hallucinations and substance abuse. The patient is nervous/anxious and has insomnia.     Blood pressure (!) 92/36, pulse 75, temperature 98.1 F (36.7 C), temperature source Oral, resp. rate 16, height 5' 2"  (1.575 m), weight 80.3 kg (177 lb), SpO2 100 %.Body mass index is 32.37 kg/m.  General Appearance: Fairly Groomed  Eye Contact:  Fair  Speech:  Slow  Volume:  Normal  Mood:  Depressed and Dysphoric  Affect:  Blunt  Thought Process:  Goal Directed  Orientation:  Full (Time, Place, and Person)  Thought Content:  Logical  Suicidal Thoughts:  Yes.  with intent/plan  Homicidal Thoughts:  No  Memory:  Immediate;   Fair Recent;   Fair Remote;   Fair  Judgement:  Impaired  Insight:  Shallow  Psychomotor Activity:  Decreased  Concentration:  Concentration: Fair  Recall:  [employer] of Knowledge:  Fair  Language:  Fair  Akathisia:  No  Handed:  Right  AIMS (if indicated):     Assets:  Resilience Social Support  ADL's:  Intact  Cognition:  Impaired,  Mild  Sleep:        Treatment Plan Summary: Daily contact with patient to assess and evaluate symptoms and progress in treatment, Medication management and Plan Follow-up for 84 year old woman status post a suicide attempt.  Patient is medically stabilizing.  She is alert and oriented.  She has only minor and perhaps no significant dementia.  She is now able  to get up and walk around her room.  Because of her age I think the patient is most appropriately referred to a geriatric psychiatry unit.  I think she would probably be an excellent candidate for that given that she has a major depression without a major amount of cognitive problems, has a supportive family and a place to return to live and is in  reasonably good health.  I have put in a social work consult and suggested she be referred to geriatric psychiatry hospitals.  Continue the involuntary commitment for now.  Disposition: Recommend psychiatric Inpatient admission when medically cleared. Supportive therapy provided about ongoing stressors.  Alethia Berthold, MD 11/01/2017 4:14 PM

## 2017-11-01 NOTE — Progress Notes (Signed)
Discussed VBG results with Dr. Juleen China of nephrology after assuming care from Dr. Jannifer Franklin. The patient is a DNR and does not want dialysis. Nephrology recommendations to start bicarb drip.

## 2017-11-01 NOTE — Consult Note (Signed)
[facility]  CONSULT NOTE    Date: 11/01/2017                  Patient Name:  Lisa Campbell  MRN: [id number]  DOB: Feb 15, 1928  Age / Sex: 84 y.o., female         PCP: McLean-Scocuzza, Nino Glow, MD                 Service Requesting Consult: Dr. Verdell Carmine                 Reason for Consult: Acidosis            History of Present Illness: Lisa Campbell is a 84 y.o. white female with hypertension, diabetes mellitus type II, peptic ulcer disease/GERD, hyperlipidemia, who was admitted to [facility] on 10/31/2017 for Lactic acidosis [E87.2] Acute encephalopathy [G93.40] Ingestion of unknown drug, intentional self-harm, initial encounter ([place]) [T50.902A]   Patient ingested a large amount of metformin after an argument with her sister who is also her roommate.   Patient found to have increasing lactic acidosis with pH of 7.1. Started on IV bicarb gtt.   Patient not sure if she would consider dialysis.   Denies any suicidal symptoms.   Sitter at bedside.    Medications: Outpatient medications: Medications Prior to Admission  Medication Sig Dispense Refill Last Dose  . Cholecalciferol (VITAMIN D3) 2000 units TABS Take 2,000 Units by mouth daily.    N/A at N/A  . losartan-hydrochlorothiazide (HYZAAR) 100-25 MG tablet Take 1 tablet by mouth daily. 90 tablet 1 N/A at N/A  . metFORMIN (GLUCOPHAGE) 500 MG tablet Take 500 mg by mouth 2 (two) times daily with a meal.   N/A at N/A  . omeprazole (PRILOSEC) 20 MG capsule Take 20 mg by mouth daily.   N/A at N/A  . sitaGLIPtin (JANUVIA) 50 MG tablet Take 1 tablet (50 mg total) by mouth daily. 90 tablet 1 N/A at N/A    Current medications: Current Facility-Administered Medications  Medication Dose Route Frequency Provider Last Rate Last Dose  . acetaminophen (TYLENOL) tablet 650 mg  650 mg Oral Q6H PRN Saundra Shelling, MD       Or  . acetaminophen (TYLENOL) suppository 650 mg  650 mg Rectal Q6H PRN Pyreddy, Reatha Harps, MD       . enoxaparin (LOVENOX) injection 30 mg  30 mg Subcutaneous Q24H Pyreddy, Pavan, MD   30 mg at 10/31/17 2228  . hydrALAZINE (APRESOLINE) injection 10 mg  10 mg Intravenous Q4H PRN Pyreddy, Reatha Harps, MD      . ondansetron (ZOFRAN) tablet 4 mg  4 mg Oral Q6H PRN Pyreddy, Reatha Harps, MD       Or  . ondansetron (ZOFRAN) injection 4 mg  4 mg Intravenous Q6H PRN Saundra Shelling, MD   4 mg at 10/31/17 1808  . senna-docusate (Senokot-S) tablet 1 tablet  1 tablet Oral QHS PRN Pyreddy, Pavan, MD      . sodium bicarbonate 150 mEq in dextrose 5 % 1,000 mL infusion   Intravenous Continuous Harrie Foreman, MD 50 mL/hr at 11/01/17 5176        Allergies: Allergies  Allergen Reactions  . Penicillins     ?rxn   . Statins     ? Rxn cant tolerate crestor and lipitor noted prev records.       Past Medical History: Past Medical History:  Diagnosis Date  . Arthritis   . Cancer (HCC)    skin mohs  left ear ? 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 Colon polyps   . Gastric ulcer   . GERD (gastroesophageal reflux disease)   . Hyperlipidemia   . Hypertension   . Rotator cuff disorder    right  . Shingles      Past Surgical History: Past Surgical History:  Procedure Laterality Date  . KNEE ARTHROSCOPY     b/l      Family History: Family History  Problem Relation Age of Onset  . Cancer Mother        ?colon   . Diabetes Mother   . Early death Mother   . Heart disease Mother   . Hypertension Mother   . Hyperlipidemia Mother   . Cancer Father        ? MM  . Diabetes Sister   . Diabetes Brother   . Arthritis Brother      Social History: Social History   Socioeconomic History  . Marital status: Single    Spouse name: Not on file  . Number of children: Not on file  . Years of education: Not on file  . Highest education level: Not on file  Social Needs  . Financial resource strain: Not on file  . Food insecurity - worry: Not on file  . Food insecurity - inability:  Not on file  . Transportation needs - medical: Not on file  . Transportation needs - non-medical: Not on file  Occupational History  . Occupation: retired  Tobacco Use  . Smoking status: Never Smoker  . Smokeless tobacco: Never Used  Substance and Sexual Activity  . Alcohol use: Yes  . Drug use: No  . Sexual activity: No  Other Topics Concern  . Not on file  Social History Narrative   Retired former Lawyer, assoc. Degree    Moved from [place]    Wears seat belts, no guns at home    No exercise      Review of Systems: Review of Systems  Constitutional: Negative.  Negative for chills, diaphoresis, fever, malaise/fatigue and weight loss.  HENT: Negative.  Negative for congestion, ear discharge, ear pain, hearing loss, nosebleeds, sinus pain, sore throat and tinnitus.   Eyes: Negative.  Negative for blurred vision, double vision, photophobia, pain, discharge and redness.  Respiratory: Negative.  Negative for cough, hemoptysis, sputum production, shortness of breath, wheezing and stridor.   Cardiovascular: Negative.  Negative for chest pain, palpitations, orthopnea, claudication, leg swelling and PND.  Gastrointestinal: Negative.  Negative for abdominal pain, blood in stool, constipation, diarrhea, heartburn, melena, nausea and vomiting.  Genitourinary: Negative.  Negative for dysuria, flank pain, frequency, hematuria and urgency.  Musculoskeletal: Negative.  Negative for back pain, falls, joint pain, myalgias and neck pain.  Skin: Negative.  Negative for itching and rash.  Neurological: Negative.  Negative for dizziness, tingling, tremors, sensory change, speech change, focal weakness, seizures, loss of consciousness, weakness and headaches.  Endo/Heme/Allergies: Negative.  Negative for environmental allergies and polydipsia. Does not bruise/bleed easily.  Psychiatric/Behavioral: Positive for depression and suicidal ideas. Negative for hallucinations and substance  abuse. The patient is not nervous/anxious and does not have insomnia.     Vital Signs: Blood pressure (!) 117/42, pulse 79, temperature 98.4 F (36.9 C), temperature source Oral, resp. rate 16, height 5\' 2"  (1.575 m), weight 80.3 kg (177 lb), SpO2 97 %.  Weight trends: Filed Weights   10/31/17 1415  Weight: 80.3 kg (177  lb)    Physical Exam: General: NAD,   Head: Normocephalic, atraumatic. Moist oral mucosal membranes  Eyes: Anicteric, PERRL  Neck: Supple, trachea midline  Lungs:  Clear to auscultation  Heart: Regular rate and rhythm  Abdomen:  Soft, nontender,   Extremities: no peripheral edema.  Neurologic: Nonfocal, moving all four extremities  Skin: No lesions         Lab results: Basic Metabolic Panel: Recent Labs  Lab 10/31/17 1440 11/01/17 0556  NA 139 142  K 3.9 5.3*  CL 105 106  CO2 20* 14*  GLUCOSE 167* 134*  BUN 16 21*  CREATININE 1.36* 1.70*  CALCIUM 8.8* 8.3*  MG 1.2* 2.2    Liver Function Tests: Recent Labs  Lab 10/31/17 1440  AST 30  ALT 13*  ALKPHOS 59  BILITOT 1.9*  PROT 6.6  ALBUMIN 3.7   No results for input(s): LIPASE, AMYLASE in the last 168 hours. No results for input(s): AMMONIA in the last 168 hours.  CBC: Recent Labs  Lab 10/31/17 1440 11/01/17 0556  WBC 12.3* 13.4*  NEUTROABS 10.3*  --   HGB 11.9* 12.3  HCT 35.6 37.9  MCV 96.8 99.1  PLT 327 296    Cardiac Enzymes: No results for input(s): CKTOTAL, CKMB, CKMBINDEX, TROPONINI in the last 168 hours.  BNP: Invalid input(s): POCBNP  CBG: Recent Labs  Lab 11/01/17 0557 11/01/17 0800 11/01/17 1010 11/01/17 1158 11/01/17 1404  GLUCAP 118* 109* 109* 107* 115*    Microbiology: No results found for this or any previous visit.  Coagulation Studies: No results for input(s): LABPROT, INR in the last 72 hours.  Urinalysis: Recent Labs    10/31/17 1418  COLORURINE YELLOW*  LABSPEC 1.008  PHURINE 6.0  GLUCOSEU 50*  HGBUR NEGATIVE  BILIRUBINUR NEGATIVE   KETONESUR NEGATIVE  PROTEINUR 30*  NITRITE NEGATIVE  LEUKOCYTESUR NEGATIVE      Imaging: Dg Chest Portable 1 View  Result Date: 10/31/2017 CLINICAL DATA:  Evaluate for aspiration. EXAM: PORTABLE CHEST 1 VIEW COMPARISON:  None. FINDINGS: The heart size and mediastinal contours are within normal limits. Both lungs are clear. The visualized skeletal structures are unremarkable. IMPRESSION: No active disease. Electronically Signed   By: Titus Dubin M.D.   On: 10/31/2017 14:44   Ct Head Code Stroke Wo Contrast  Result Date: 10/31/2017 CLINICAL DATA:  Code stroke. Altered level of consciousness. Code stroke. Slurred speech. Drug overdose. EXAM: CT HEAD WITHOUT CONTRAST TECHNIQUE: Contiguous axial images were obtained from the base of the skull through the vertex without intravenous contrast. COMPARISON:  None. FINDINGS: Brain: Mild atrophy and white matter changes are present bilaterally. Basal ganglia are intact. Insular ribbon is normal bilaterally. No acute infarct, hemorrhage, or mass lesion is present. The ventricles are proportionate to the degree of atrophy. No significant extra-axial fluid collection is present. Vascular: Atherosclerotic calcifications are present in the cavernous internal carotid arteries bilaterally. There is no hyperdense vessel. Skull: Calvarium is intact. No focal lytic or blastic lesions are present. Sinuses/Orbits: Paranasal sinuses and mastoid air cells are clear. Bilateral lens replacements are present. Globes and orbits are within normal limits. ASPECTS [facility] Stroke Program Early CT Score) - Ganglionic level infarction (caudate, lentiform nuclei, internal capsule, insula, M1-M3 cortex): 7/7 - Supraganglionic infarction (M4-M6 cortex): 3/3 Total score (0-10 with 10 being normal): 10/10 IMPRESSION: 1. No acute intracranial abnormality. 2. Mild atrophy and white matter disease likely reflects the sequela of chronic microvascular ischemia. 3. ASPECTS is 10/10 These  results were called by  telephone at the time of interpretation on 10/31/2017 at 3:24 pm to Dr. Merlyn Lot , who verbally acknowledged these results. Electronically Signed   By: San Morelle M.D.   On: 10/31/2017 15:25      Assessment & Plan:  Lisa Campbell is a 84 y.o. white female with hypertension, diabetes mellitus type II, peptic ulcer disease/GERD, hyperlipidemia, who was admitted to [facility] on 10/31/2017 for Lactic acidosis [E87.2] Acute encephalopathy [G93.40] Ingestion of unknown drug, intentional self-harm, initial encounter ([place]) [T50.902A]   1. Acute renal failure on chronic kidney disease stage III with proteinuria: baseline creatinine of 1.38, GFR of 38 on 09/27/16.  Acute renal failure from metformin toxicity.  Chronic kidney disease secondary to diabetes and hypertension  2. Metabolic acidosis: lactic acidosis secondary to metformin toxicity.  - Continue Bicarb gtt - Patient is unsure if she will proceed with dialysis if needed. Currently no indication for dialysis.   3. Hypertension: blood pressure at goal. Holding losartan and hydrochlorothiazide.   4. Diabetes mellitus type II with chronic kidney disease: hold metformin.   Appreciate psych input.   LOS: 1 Andriana Casa 3/13/20193:21 PM

## 2017-11-01 NOTE — Progress Notes (Signed)
Notified MD Wieting of expired CBG and Lactic blood draws. Pt CBG now Q4. Lactic draw at 0500.

## 2017-11-01 NOTE — Clinical Social Work Note (Signed)
11-01-17 CSW received referral that patient needs geripsych placement.  CSW to work on trying to find placement for patient.  Jones Broom. Norval Morton, MSW, [place]  11/01/2017 7:43 PM

## 2017-11-01 NOTE — Care Management (Signed)
Patient is a resident of independent living at [facility].  Per Ronna Polio from the retirement community, patient lives with her sister.  Reports that if need a bed on the [facility] unit will be available for patient when she discharges if it is needed. She moved in with her sister about 2 months ago. After and argument with her sister, she locked herself in her room and took intentional overdose of metformin.  Nephology and psych are following. Is under IVC.

## 2017-11-02 LAB — BASIC METABOLIC PANEL
ANION GAP: 11 (ref 5–15)
BUN: 41 mg/dL — ABNORMAL HIGH (ref 6–20)
CO2: 26 mmol/L (ref 22–32)
Calcium: 8.2 mg/dL — ABNORMAL LOW (ref 8.9–10.3)
Chloride: 103 mmol/L (ref 101–111)
Creatinine, Ser: 2.53 mg/dL — ABNORMAL HIGH (ref 0.44–1.00)
GFR calc non Af Amer: 16 mL/min — ABNORMAL LOW (ref >60)
GFR, EST AFRICAN AMERICAN: 19 mL/min — AB (ref >60)
GLUCOSE: 108 mg/dL — AB (ref 65–99)
POTASSIUM: 3.8 mmol/L (ref 3.5–5.1)
Sodium: 140 mmol/L (ref 135–145)

## 2017-11-02 LAB — GLUCOSE, CAPILLARY
GLUCOSE-CAPILLARY: 107 mg/dL — AB (ref 65–99)
GLUCOSE-CAPILLARY: 93 mg/dL (ref 65–99)
Glucose-Capillary: 106 mg/dL — ABNORMAL HIGH (ref 65–99)
Glucose-Capillary: 113 mg/dL — ABNORMAL HIGH (ref 65–99)

## 2017-11-02 LAB — LACTIC ACID, PLASMA: LACTIC ACID, VENOUS: 1.9 mmol/L (ref 0.5–1.9)

## 2017-11-02 MED ORDER — SODIUM BICARBONATE 8.4 % IV SOLN
INTRAVENOUS | Status: DC
  Administered 2017-11-02 – 2017-11-03 (×2): via INTRAVENOUS
  Filled 2017-11-02 (×2): qty 150

## 2017-11-02 MED ORDER — HEPARIN SODIUM (PORCINE) 5000 UNIT/ML IJ SOLN
5000.0000 [IU] | Freq: Three times a day (TID) | INTRAMUSCULAR | Status: DC
  Administered 2017-11-02 – 2017-11-05 (×9): 5000 [IU] via SUBCUTANEOUS
  Filled 2017-11-02 (×9): qty 1

## 2017-11-02 NOTE — Progress Notes (Signed)
[facility]  ROUNDING NOTE   Subjective:   Family at bedside. Sitter at bedside Creatinine 2.53 (1.7)    Objective:  Vital signs in last 24 hours:  Temp:  [98 F (36.7 C)-98.4 F (36.9 C)] 98 F (36.7 C) (03/14 0739) Pulse Rate:  [71-79] 75 (03/14 0739) Resp:  [16-17] 17 (03/14 0739) BP: (92-117)/(35-49) 105/49 (03/14 0739) SpO2:  [94 %-100 %] 96 % (03/14 0739)  Weight change:  Filed Weights   10/31/17 1415  Weight: 80.3 kg (177 lb)    Intake/Output: I/O last 3 completed shifts: In: 83 [P.O.:720] Out: 1600 [Urine:1600]   Intake/Output this shift:  Total I/O In: 360 [P.O.:360] Out: -   Physical Exam: General: NAD,   Head: Normocephalic, atraumatic. Moist oral mucosal membranes  Eyes: Anicteric, PERRL  Neck: Supple, trachea midline  Lungs:  Clear to auscultation  Heart: Regular rate and rhythm  Abdomen:  Soft, nontender,   Extremities:  no peripheral edema.  Neurologic: Nonfocal, moving all four extremities  Skin: No lesions       Basic Metabolic Panel: Recent Labs  Lab 10/31/17 1440 11/01/17 0556 11/02/17 0808  NA 139 142 140  K 3.9 5.3* 3.8  CL 105 106 103  CO2 20* 14* 26  GLUCOSE 167* 134* 108*  BUN 16 21* 41*  CREATININE 1.36* 1.70* 2.53*  CALCIUM 8.8* 8.3* 8.2*  MG 1.2* 2.2  --     Liver Function Tests: Recent Labs  Lab 10/31/17 1440  AST 30  ALT 13*  ALKPHOS 59  BILITOT 1.9*  PROT 6.6  ALBUMIN 3.7   No results for input(s): LIPASE, AMYLASE in the last 168 hours. No results for input(s): AMMONIA in the last 168 hours.  CBC: Recent Labs  Lab 10/31/17 1440 11/01/17 0556  WBC 12.3* 13.4*  NEUTROABS 10.3*  --   HGB 11.9* 12.3  HCT 35.6 37.9  MCV 96.8 99.1  PLT 327 296    Cardiac Enzymes: No results for input(s): CKTOTAL, CKMB, CKMBINDEX, TROPONINI in the last 168 hours.  BNP: Invalid input(s): POCBNP  CBG: Recent Labs  Lab 11/01/17 1816 11/01/17 1955 11/01/17 2343 11/02/17 0349 11/02/17 0758   GLUCAP 126* 130* 109* 107* 93    Microbiology: No results found for this or any previous visit.  Coagulation Studies: No results for input(s): LABPROT, INR in the last 72 hours.  Urinalysis: Recent Labs    10/31/17 1418  COLORURINE YELLOW*  LABSPEC 1.008  PHURINE 6.0  GLUCOSEU 50*  HGBUR NEGATIVE  BILIRUBINUR NEGATIVE  KETONESUR NEGATIVE  PROTEINUR 30*  NITRITE NEGATIVE  LEUKOCYTESUR NEGATIVE      Imaging: Dg Chest Portable 1 View  Result Date: 10/31/2017 CLINICAL DATA:  Evaluate for aspiration. EXAM: PORTABLE CHEST 1 VIEW COMPARISON:  None. FINDINGS: The heart size and mediastinal contours are within normal limits. Both lungs are clear. The visualized skeletal structures are unremarkable. IMPRESSION: No active disease. Electronically Signed   By: Titus Dubin M.D.   On: 10/31/2017 14:44   Ct Head Code Stroke Wo Contrast  Result Date: 10/31/2017 CLINICAL DATA:  Code stroke. Altered level of consciousness. Code stroke. Slurred speech. Drug overdose. EXAM: CT HEAD WITHOUT CONTRAST TECHNIQUE: Contiguous axial images were obtained from the base of the skull through the vertex without intravenous contrast. COMPARISON:  None. FINDINGS: Brain: Mild atrophy and white matter changes are present bilaterally. Basal ganglia are intact. Insular ribbon is normal bilaterally. No acute infarct, hemorrhage, or mass lesion is present. The ventricles are proportionate to  the degree of atrophy. No significant extra-axial fluid collection is present. Vascular: Atherosclerotic calcifications are present in the cavernous internal carotid arteries bilaterally. There is no hyperdense vessel. Skull: Calvarium is intact. No focal lytic or blastic lesions are present. Sinuses/Orbits: Paranasal sinuses and mastoid air cells are clear. Bilateral lens replacements are present. Globes and orbits are within normal limits. ASPECTS [facility] Stroke Program Early CT Score) - Ganglionic level infarction (caudate,  lentiform nuclei, internal capsule, insula, M1-M3 cortex): 7/7 - Supraganglionic infarction (M4-M6 cortex): 3/3 Total score (0-10 with 10 being normal): 10/10 IMPRESSION: 1. No acute intracranial abnormality. 2. Mild atrophy and white matter disease likely reflects the sequela of chronic microvascular ischemia. 3. ASPECTS is 10/10 These results were called by telephone at the time of interpretation on 10/31/2017 at 3:24 pm to Dr. Merlyn Lot , who verbally acknowledged these results. Electronically Signed   By: San Morelle M.D.   On: 10/31/2017 15:25     Medications:   .  sodium bicarbonate  infusion 1000 mL     . enoxaparin (LOVENOX) injection  30 mg Subcutaneous Q24H   acetaminophen **OR** acetaminophen, hydrALAZINE, ondansetron **OR** ondansetron (ZOFRAN) IV, senna-docusate  Assessment/ Plan:  Lisa Campbell is a 84 y.o. white female with hypertension, diabetes mellitus type II, peptic ulcer disease/GERD, hyperlipidemia, who was admitted to [facility] on 10/31/2017 for suicide attempt and overdose of metformin.   1. Acute renal failure on chronic kidney disease stage III with proteinuria: baseline creatinine of 1.38, GFR of 38 on 09/27/16.  Acute renal failure from metformin toxicity.  Chronic kidney disease secondary to diabetes and hypertension  2. Metabolic acidosis: lactic acidosis secondary to metformin toxicity.  - Continue Bicarb gtt - Patient is unsure if she will proceed with dialysis if needed. Currently no indication for dialysis.   3. Hypertension: blood pressure at goal. Holding losartan and hydrochlorothiazide.   4. Diabetes mellitus type II with chronic kidney disease: hold metformin.   5. Depression: not currently on an antidepressant - Appreciate psych input.    LOS: 2 Lisa Campbell 3/14/201910:47 AM

## 2017-11-02 NOTE — Progress Notes (Signed)
lovenox changed to heparin due to crcl approaching <21ml/hr  Tanaja Ganger D Pheng Prokop, Pharm.D, BCPS Clinical Pharmacist

## 2017-11-02 NOTE — Progress Notes (Signed)
[facility] [place] NAME: Lisa Campbell    MR#:  [id number]  DATE OF BIRTH:  July 31, 1928  SUBJECTIVE:   Patient admitted to the hospital secondary to drug overdose from metformin and Januvia. Noted to be in lactic acidosis due to metformin toxicity. His blood sugars are stable, creatinine somewhat worse since yesterday. Lactic acid is normal now.  REVIEW OF SYSTEMS:    Review of Systems  Constitutional: Negative for chills and fever.  HENT: Negative for congestion and tinnitus.   Eyes: Negative for blurred vision and double vision.  Respiratory: Negative for cough, shortness of breath and wheezing.   Cardiovascular: Negative for chest pain, orthopnea and PND.  Gastrointestinal: Negative for abdominal pain, diarrhea, nausea and vomiting.  Genitourinary: Negative for dysuria and hematuria.  Neurological: Negative for dizziness, sensory change and focal weakness.  All other systems reviewed and are negative.   Nutrition: Heart Healthy/Carb control Tolerating Diet: Yes Tolerating PT: Await Eval.   DRUG ALLERGIES:   Allergies  Allergen Reactions  . Penicillins     ?rxn   . Statins     ? Rxn cant tolerate crestor and lipitor noted prev records.     VITALS:  Blood pressure (!) 105/49, pulse 75, temperature 98 F (36.7 C), temperature source Oral, resp. rate 17, height 5\' 2"  (1.575 m), weight 80.3 kg (177 lb), SpO2 96 %.  PHYSICAL EXAMINATION:   Physical Exam  GENERAL:  84 y.o.-year-old patient lying in bed in no acute distress.  EYES: Pupils equal, round, reactive to light and accommodation. No scleral icterus. Extraocular muscles intact.  HEENT: Head atraumatic, normocephalic. Oropharynx and nasopharynx clear.  NECK:  Supple, no jugular venous distention. No thyroid enlargement, no tenderness.  LUNGS: Normal breath sounds bilaterally, no wheezing, rales, rhonchi. No use of accessory muscles of respiration.  CARDIOVASCULAR: S1, S2  normal. No murmurs, rubs, or gallops.  ABDOMEN: Soft, nontender, nondistended. Bowel sounds present. No organomegaly or mass.  EXTREMITIES: No cyanosis, clubbing or edema b/l.    NEUROLOGIC: Cranial nerves II through XII are intact. No focal Motor or sensory deficits b/l.   PSYCHIATRIC: The patient is alert and oriented x 3.  SKIN: No obvious rash, lesion, or ulcer.    LABORATORY PANEL:   CBC Recent Labs  Lab 11/01/17 0556  WBC 13.4*  HGB 12.3  HCT 37.9  PLT 296   ------------------------------------------------------------------------------------------------------------------  Chemistries  Recent Labs  Lab 10/31/17 1440 11/01/17 0556 11/02/17 0808  NA 139 142 140  K 3.9 5.3* 3.8  CL 105 106 103  CO2 20* 14* 26  GLUCOSE 167* 134* 108*  BUN 16 21* 41*  CREATININE 1.36* 1.70* 2.53*  CALCIUM 8.8* 8.3* 8.2*  MG 1.2* 2.2  --   AST 30  --   --   ALT 13*  --   --   ALKPHOS 59  --   --   BILITOT 1.9*  --   --    ------------------------------------------------------------------------------------------------------------------  Cardiac Enzymes No results for input(s): TROPONINI in the last 168 hours. ------------------------------------------------------------------------------------------------------------------  RADIOLOGY:  Ct Head Code Stroke Wo Contrast  Result Date: 10/31/2017 CLINICAL DATA:  Code stroke. Altered level of consciousness. Code stroke. Slurred speech. Drug overdose. EXAM: CT HEAD WITHOUT CONTRAST TECHNIQUE: Contiguous axial images were obtained from the base of the skull through the vertex without intravenous contrast. COMPARISON:  None. FINDINGS: Brain: Mild atrophy and white matter changes are present bilaterally. Basal ganglia are intact. Insular ribbon is normal  bilaterally. No acute infarct, hemorrhage, or mass lesion is present. The ventricles are proportionate to the degree of atrophy. No significant extra-axial fluid collection is present. Vascular:  Atherosclerotic calcifications are present in the cavernous internal carotid arteries bilaterally. There is no hyperdense vessel. Skull: Calvarium is intact. No focal lytic or blastic lesions are present. Sinuses/Orbits: Paranasal sinuses and mastoid air cells are clear. Bilateral lens replacements are present. Globes and orbits are within normal limits. ASPECTS [facility] Stroke Program Early CT Score) - Ganglionic level infarction (caudate, lentiform nuclei, internal capsule, insula, M1-M3 cortex): 7/7 - Supraganglionic infarction (M4-M6 cortex): 3/3 Total score (0-10 with 10 being normal): 10/10 IMPRESSION: 1. No acute intracranial abnormality. 2. Mild atrophy and white matter disease likely reflects the sequela of chronic microvascular ischemia. 3. ASPECTS is 10/10 These results were called by telephone at the time of interpretation on 10/31/2017 at 3:24 pm to Dr. Merlyn Lot , who verbally acknowledged these results. Electronically Signed   By: San Morelle M.D.   On: 10/31/2017 15:25     ASSESSMENT AND PLAN:   84 year old female with past medical history of diabetes, hypertension, hyperlipidemia, history of osteoarthritis, GERD who presented to the hospital due to drug overdose with increasing intake of metformin, Januvia.  1. Metformin toxicity-patient took increasing amounts of metformin. -Patient noted to have lactic acidosis.  -Lactic acid is normal eyes, renal function somewhat worse today, we'll continue bicarbonate drip as per nephrology. No acute indication for dialysis and patient doesn't want that at present.  2. Lactic acidosis-secondary to metformin toxicity. Lactic acid normalized, continue bicarbonate drip for now. -No acute need for hemodialysis presently.  3. Depression with suicide attempt-patient took multiple pills of metformin and Januvia as a suicide attempt. -Seen by psychiatry, continue one-to-one sitter, discussed with Dr. Weber Cooks and patient may need a Geri  psychiatric unit placement upon discharge.  4. Acute kidney injury-secondary to dehydration and metformin toxicity. - cont. Bicarb drip. Avoid nephrotoxins.  Renal dose meds and avoid nephrotoxins.  - follow BUN/Cr.   5. Essential HTN - cont. PRN Hydralazine.    All the records are reviewed and case discussed with Care Management/Social Worker. Management plans discussed with the patient, family and they are in agreement.  CODE STATUS: DNR  DVT Prophylaxis: Lovenox  TOTAL TIME TAKING CARE OF THIS PATIENT: 30 minutes.   POSSIBLE D/C IN unclear presently, DEPENDING ON progress.   Henreitta Leber M.D on 11/02/2017 at 2:51 PM  Between 7am to 6pm - Pager - [phone]  After 6pm go to [email or web address] - Proofreader  [facility]  Office  [phone]  CC: Primary care physician; McLean-Scocuzza, Nino Glow, MD

## 2017-11-03 ENCOUNTER — Telehealth: Payer: Self-pay

## 2017-11-03 LAB — GLUCOSE, CAPILLARY
Glucose-Capillary: 128 mg/dL — ABNORMAL HIGH (ref 65–99)
Glucose-Capillary: 108 mg/dL — ABNORMAL HIGH (ref 65–99)
Glucose-Capillary: 112 mg/dL — ABNORMAL HIGH (ref 65–99)

## 2017-11-03 LAB — RENAL FUNCTION PANEL
ANION GAP: 12 (ref 5–15)
Albumin: 3.5 g/dL (ref 3.5–5.0)
BUN: 33 mg/dL — ABNORMAL HIGH (ref 6–20)
CALCIUM: 8.6 mg/dL — AB (ref 8.9–10.3)
CO2: 31 mmol/L (ref 22–32)
CREATININE: 1.86 mg/dL — AB (ref 0.44–1.00)
Chloride: 99 mmol/L — ABNORMAL LOW (ref 101–111)
GFR calc non Af Amer: 23 mL/min — ABNORMAL LOW (ref >60)
GFR, EST AFRICAN AMERICAN: 27 mL/min — AB (ref >60)
Glucose, Bld: 126 mg/dL — ABNORMAL HIGH (ref 65–99)
PHOSPHORUS: 2.8 mg/dL (ref 2.5–4.6)
Potassium: 3.6 mmol/L (ref 3.5–5.1)
SODIUM: 142 mmol/L (ref 135–145)

## 2017-11-03 MED ORDER — SODIUM CHLORIDE 0.9% FLUSH
3.0000 mL | Freq: Two times a day (BID) | INTRAVENOUS | Status: DC
  Administered 2017-11-03 – 2017-11-04 (×5): 3 mL via INTRAVENOUS

## 2017-11-03 MED ORDER — SODIUM CHLORIDE 0.9% FLUSH
3.0000 mL | Freq: Two times a day (BID) | INTRAVENOUS | Status: DC
  Administered 2017-11-03 – 2017-11-04 (×3): 3 mL via INTRAVENOUS

## 2017-11-03 MED ORDER — CITALOPRAM HYDROBROMIDE 20 MG PO TABS
10.0000 mg | ORAL_TABLET | Freq: Every day | ORAL | Status: DC
  Administered 2017-11-03 – 2017-11-04 (×2): 10 mg via ORAL
  Filled 2017-11-03 (×2): qty 1

## 2017-11-03 NOTE — Progress Notes (Signed)
[facility]  ROUNDING NOTE   Subjective:   Sister at bedside. Sitter at bedside Creatinine 1.86 (2.53) (1.7)  Bicarb gtt   Objective:  Vital signs in last 24 hours:  Temp:  [98.4 F (36.9 C)-98.6 F (37 C)] 98.5 F (36.9 C) (03/15 0528) Pulse Rate:  [68-79] 68 (03/15 0528) Resp:  [16-18] 16 (03/15 0528) BP: (107-139)/(51-59) 136/59 (03/15 0528) SpO2:  [96 %-97 %] 96 % (03/15 0528)  Weight change:  Filed Weights   10/31/17 1415  Weight: 80.3 kg (177 lb)    Intake/Output: I/O last 3 completed shifts: In: 3557 [P.O.:600; I.V.:850] Out: -    Intake/Output this shift:  Total I/O In: 120 [P.O.:120] Out: -   Physical Exam: General: NAD,   Head: Normocephalic, atraumatic. Moist oral mucosal membranes  Eyes: Anicteric, PERRL  Neck: Supple, trachea midline  Lungs:  Clear to auscultation  Heart: Regular rate and rhythm  Abdomen:  Soft, nontender  Extremities:  no peripheral edema.  Neurologic: Nonfocal, moving all four extremities  Skin: No lesions       Basic Metabolic Panel: Recent Labs  Lab 10/31/17 1440 11/01/17 0556 11/02/17 0808 11/03/17 0517  NA 139 142 140 142  K 3.9 5.3* 3.8 3.6  CL 105 106 103 99*  CO2 20* 14* 26 31  GLUCOSE 167* 134* 108* 126*  BUN 16 21* 41* 33*  CREATININE 1.36* 1.70* 2.53* 1.86*  CALCIUM 8.8* 8.3* 8.2* 8.6*  MG 1.2* 2.2  --   --   PHOS  --   --   --  2.8    Liver Function Tests: Recent Labs  Lab 10/31/17 1440 11/03/17 0517  AST 30  --   ALT 13*  --   ALKPHOS 59  --   BILITOT 1.9*  --   PROT 6.6  --   ALBUMIN 3.7 3.5   No results for input(s): LIPASE, AMYLASE in the last 168 hours. No results for input(s): AMMONIA in the last 168 hours.  CBC: Recent Labs  Lab 10/31/17 1440 11/01/17 0556  WBC 12.3* 13.4*  NEUTROABS 10.3*  --   HGB 11.9* 12.3  HCT 35.6 37.9  MCV 96.8 99.1  PLT 327 296    Cardiac Enzymes: No results for input(s): CKTOTAL, CKMB, CKMBINDEX, TROPONINI in the last 168  hours.  BNP: Invalid input(s): POCBNP  CBG: Recent Labs  Lab 11/02/17 0349 11/02/17 0758 11/02/17 1702 11/02/17 2138 11/03/17 0808  GLUCAP 107* 93 106* 113* 128*    Microbiology: No results found for this or any previous visit.  Coagulation Studies: No results for input(s): LABPROT, INR in the last 72 hours.  Urinalysis: Recent Labs    10/31/17 1418  COLORURINE YELLOW*  LABSPEC 1.008  PHURINE 6.0  GLUCOSEU 50*  HGBUR NEGATIVE  BILIRUBINUR NEGATIVE  KETONESUR NEGATIVE  PROTEINUR 30*  NITRITE NEGATIVE  LEUKOCYTESUR NEGATIVE      Imaging: No results found.   Medications:    . heparin injection (subcutaneous)  5,000 Units Subcutaneous Q8H   acetaminophen **OR** acetaminophen, hydrALAZINE, ondansetron **OR** ondansetron (ZOFRAN) IV, senna-docusate  Assessment/ Plan:  Lisa Campbell is a 84 y.o. white female with hypertension, diabetes mellitus type II, peptic ulcer disease/GERD, hyperlipidemia, who was admitted to [facility] on 10/31/2017 for suicide attempt and overdose of metformin.   1. Acute renal failure on chronic kidney disease stage III with proteinuria: baseline creatinine of 1.38, GFR of 38 on 09/27/16.  Acute renal failure from metformin toxicity.  Chronic kidney disease secondary to  diabetes and hypertension - Discontinue IV fluids  2. Metabolic acidosis: lactic acidosis secondary to metformin toxicity.  - Discontinue Bicarb gtt - Patient is unsure if she will proceed with dialysis if needed. Currently no indication for dialysis.   3. Hypertension: blood pressure at goal. Holding losartan and hydrochlorothiazide.   4. Diabetes mellitus type II with chronic kidney disease: hold metformin.   5. Depression:  - Appreciate psych input.    LOS: 3 Pamala Hayman 3/15/201910:35 AM

## 2017-11-03 NOTE — Progress Notes (Signed)
[facility] [place] NAME: Terita Hejl    MR#:  [id number]  DATE OF BIRTH:  August 16, 1928  SUBJECTIVE:   Lactate has normalized now. Cr. Improving. No N/V.  Seen by Nephro no acute need for HD. Awaiting Psych input regarding IVC.    REVIEW OF SYSTEMS:    Review of Systems  Constitutional: Negative for chills and fever.  HENT: Negative for congestion and tinnitus.   Eyes: Negative for blurred vision and double vision.  Respiratory: Negative for cough, shortness of breath and wheezing.   Cardiovascular: Negative for chest pain, orthopnea and PND.  Gastrointestinal: Negative for abdominal pain, diarrhea, nausea and vomiting.  Genitourinary: Negative for dysuria and hematuria.  Neurological: Negative for dizziness, sensory change and focal weakness.  All other systems reviewed and are negative.   Nutrition: Heart Healthy/Carb control Tolerating Diet: Yes Tolerating PT: pt. Is ambulatory  DRUG ALLERGIES:   Allergies  Allergen Reactions  . Penicillins     ?rxn   . Statins     ? Rxn cant tolerate crestor and lipitor noted prev records.     VITALS:  Blood pressure (!) 136/59, pulse 68, temperature 98.5 F (36.9 C), temperature source Oral, resp. rate 16, height 5\' 2"  (1.575 m), weight 80.3 kg (177 lb), SpO2 96 %.  PHYSICAL EXAMINATION:   Physical Exam  GENERAL:  84 y.o.-year-old patient sitting up in chair in no acute distress.  EYES: Pupils equal, round, reactive to light and accommodation. No scleral icterus. Extraocular muscles intact.  HEENT: Head atraumatic, normocephalic. Oropharynx and nasopharynx clear.  NECK:  Supple, no jugular venous distention. No thyroid enlargement, no tenderness.  LUNGS: Normal breath sounds bilaterally, no wheezing, rales, rhonchi. No use of accessory muscles of respiration.  CARDIOVASCULAR: S1, S2 normal. No murmurs, rubs, or gallops.  ABDOMEN: Soft, nontender, nondistended. Bowel sounds present. No  organomegaly or mass.  EXTREMITIES: No cyanosis, clubbing or edema b/l.    NEUROLOGIC: Cranial nerves II through XII are intact. No focal Motor or sensory deficits b/l.   PSYCHIATRIC: The patient is alert and oriented x 3.  SKIN: No obvious rash, lesion, or ulcer.    LABORATORY PANEL:   CBC Recent Labs  Lab 11/01/17 0556  WBC 13.4*  HGB 12.3  HCT 37.9  PLT 296   ------------------------------------------------------------------------------------------------------------------  Chemistries  Recent Labs  Lab 10/31/17 1440 11/01/17 0556  11/03/17 0517  NA 139 142   < > 142  K 3.9 5.3*   < > 3.6  CL 105 106   < > 99*  CO2 20* 14*   < > 31  GLUCOSE 167* 134*   < > 126*  BUN 16 21*   < > 33*  CREATININE 1.36* 1.70*   < > 1.86*  CALCIUM 8.8* 8.3*   < > 8.6*  MG 1.2* 2.2  --   --   AST 30  --   --   --   ALT 13*  --   --   --   ALKPHOS 59  --   --   --   BILITOT 1.9*  --   --   --    < > = values in this interval not displayed.   ------------------------------------------------------------------------------------------------------------------  Cardiac Enzymes No results for input(s): TROPONINI in the last 168 hours. ------------------------------------------------------------------------------------------------------------------  RADIOLOGY:  No results found.   ASSESSMENT AND PLAN:   84 year old female with past medical history of diabetes, hypertension, hyperlipidemia, history of osteoarthritis, GERD  who presented to the hospital due to drug overdose with increasing intake of metformin, Januvia.  1. Metformin toxicity-patient took increasing amounts of metformin. -Patient noted to have lactic acidosis.  -Lactic acid has normalized now, renal function improving, off Bicarb drip and no acute indication for HD.  - BS stable.   2. Lactic acidosis-secondary to metformin toxicity. Lactic acid normalized off Bicarb gtt now.  -No acute need for hemodialysis  presently.  3. Depression with suicide attempt-patient took multiple pills of metformin and Januvia as a suicide attempt. -Seen by psychiatry, continue one-to-one sitter, Called Dr. Weber Cooks today to see if IVC can be discontinued. As per social work no Geri-Psych facilities offering beds ?? Discharged down to behavioral medicine if needed as per Psych or Home.   4. Acute kidney injury-secondary to dehydration and metformin toxicity. - improved w/ IV fluids. Avoid nephrotoxins.  Renal dose meds and avoid nephrotoxins.  - follow BUN/Cr.   5. Essential HTN - cont. PRN Hydralazine.    All the records are reviewed and case discussed with Care Management/Social Worker. Management plans discussed with the patient, family and they are in agreement.  CODE STATUS: DNR  DVT Prophylaxis: Lovenox  TOTAL TIME TAKING CARE OF THIS PATIENT: 30 minutes.   POSSIBLE D/C  unclear presently, DEPENDING ON progress.   Henreitta Leber M.D on 11/03/2017 at 3:19 PM  Between 7am to 6pm - Pager - [phone]  After 6pm go to [email or web address] - Proofreader  [facility]  Office  [phone]  CC: Primary care physician; McLean-Scocuzza, Nino Glow, MD

## 2017-11-03 NOTE — Clinical Social Work Note (Signed)
Clinical Social Work Assessment  Patient Details  Name: Lisa Campbell MRN: [id number] Date of Birth: 05-Jun-1928  Date of referral:  11/02/17               Reason for consult:  Facility Placement, Suicide Risk/Attempt                Permission sought to share information with:  Facility Sport and exercise psychologist, Family Supports Permission granted to share information::  Yes, Verbal Permission Granted  Name::        Agency::  Geripsych admissions  Relationship::     Contact Information:     Housing/Transportation Living arrangements for the past 2 months:  Independent Living Facility([facility]) Source of Information:  Patient, Siblings, Medical Team Patient Interpreter Needed:  None Criminal Activity/Legal Involvement Pertinent to Current Situation/Hospitalization:    Significant Relationships:  Other Family Members Lives with:  Siblings Do you feel safe going back to the place where you live?  No Need for family participation in patient care:  Yes (Comment)  Care giving concerns: Patient is under IVC due to attempted suicide through drug overdose.   Social Worker assessment / plan:  Patient is an 84 year old female who currently lives at [facility] [place] living.  Patient lives with her sister, according to patient and medical records.  Patient just moved down her about two months ago from [place], patient states she has not been happy with the move, she moved to be closer to sister.  Patient got in an argument with her sister and then locked herself in bedroom and tried to overdose on medication.   Patient was reexplained about the IVC, patient states she moved down here now she is being treated like a prisoner.  CSW informed her that she is still considered a risk to her self or others, so the MD has to decide when she can be released.  Patient was explained what to expect, patient stated she was waiting to speak with psych.  Patient's family asked what  locations are available, CSW informed her it will depend on who responds back.  Patient was angry that she is under IVC, but expressed understanding.  Patient did not have any other questions at this time.  Employment status:  Retired Forensic scientist:  Medicare PT Recommendations:    Information / Referral to community resources:  [place] (Comment Required)(Psych recommending geripsych.)  Patient/Family's Response to care:  Patient does not want to go to inpatient psych, but she is under IVC and does not have a choice.  Patient/Family's Understanding of and Emotional Response to Diagnosis, Current Treatment, and Prognosis:  Patient is aware of current treatment plan, but she is not happy about it.  Emotional Assessment Appearance:  Appears older than stated age Attitude/Demeanor/Rapport:  Angry Affect (typically observed):  Angry Orientation:  Oriented to  Time, Oriented to Place, Oriented to Self Alcohol / Substance use:  Alcohol Use Psych involvement (Current and /or in the community):  Yes (Comment)  Discharge Needs  Concerns to be addressed:  Care Coordination, Mental Health Concerns Readmission within the last 30 days:  No Current discharge risk:  Psychiatric Illness, Legal Concerns, Other(Attempted suicide by drug overdose.) Barriers to Discharge:  Requiring sitter/restraints, Continued Medical Work up, Psych Bed not available   [name], LCSWA 11/03/2017, 4:02 AM

## 2017-11-03 NOTE — Consult Note (Signed)
[facility] Face-to-Face Psychiatry Consult   Reason for Consult: Consult for this 84 year old woman who attempted suicide by overdose of metformin and Januvia.  Follow-up consult. Referring Physician: Verdell Carmine Patient Identification: Lisa Campbell MRN:  [id number] Principal Diagnosis: Severe major depression, single episode, without psychotic features [facility]) Diagnosis:   Patient Active Problem List   Diagnosis Date Noted  . Altered mental status [R41.82] 10/31/2017  . Suicide attempt ([place]) [T14.91XA] 10/31/2017  . Severe major depression, single episode, without psychotic features ([place]) [F32.2] 10/31/2017  . HTN (hypertension) [I10] 09/30/2017  . GERD (gastroesophageal reflux disease) [K21.9] 09/30/2017  . Gout [M10.9] 09/30/2017  . Controlled type 2 diabetes mellitus without complication, without long-term current use of insulin ([place]) [E11.9] 09/27/2017  . Vitamin D deficiency [E55.9] 09/27/2017  . History of skin cancer [Z85.828] 09/27/2017    Total Time spent with patient: 20 minutes  Subjective:   Lisa Campbell is a 84 y.o. female patient admitted with "I wish I could go home".  HPI: Follow-up note.  See previous notes.  84 year old woman presented to the hospital a few days ago after having overdosed on metformin and Januvia.  She was very clear at the time that this was a suicide attempt.  Describes symptoms of depression and frustration and unhappiness that had been mounting at least since she moved to [place].  Patient was treated on the medical service for the last several days because she had been showing signs of kidney disease related to the metformin overdose.  Renal functioning appears to have stabilized and it is hoped that it will be getting better.  Medical service feels she no longer needs inpatient medical hospitalization.  Patient is not continuing to report suicidal ideation but is clearly still unhappy dysphoric down and withdrawn.  I had not started her on any  antidepressant medicine while waiting for her renal function to stabilize.  Patient had been referred by social work to geriatric psychiatry beds but no success has been forthcoming  Past Psychiatric History: No previous history of suicide attempts or treatment for major mental health problems  Risk to Self: Is patient at risk for suicide?: Yes Risk to Others:   Prior Inpatient Therapy:   Prior Outpatient Therapy:    Past Medical History:  Past Medical History:  Diagnosis Date  . Arthritis   . Cancer ([place])    skin mohs left ear ? 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 Colon polyps   . Gastric ulcer   . GERD (gastroesophageal reflux disease)   . Hyperlipidemia   . Hypertension   . Rotator cuff disorder    right  . Shingles     Past Surgical History:  Procedure Laterality Date  . KNEE ARTHROSCOPY     b/l    Family History:  Family History  Problem Relation Age of Onset  . Cancer Mother        ?colon   . Diabetes Mother   . Early death Mother   . Heart disease Mother   . Hypertension Mother   . Hyperlipidemia Mother   . Cancer Father        ? MM  . Diabetes Sister   . Diabetes Brother   . Arthritis Brother    Family Psychiatric  History: None known Social History:  Social History   Substance and Sexual Activity  Alcohol Use Yes     Social History   Substance and Sexual Activity  Drug Use No  Social History   Socioeconomic History  . Marital status: Single    Spouse name: None  . Number of children: None  . Years of education: None  . Highest education level: None  Social Needs  . Financial resource strain: None  . Food insecurity - worry: None  . Food insecurity - inability: None  . Transportation needs - medical: None  . Transportation needs - non-medical: None  Occupational History  . Occupation: retired  Tobacco Use  . Smoking status: Never Smoker  . Smokeless tobacco: Never Used  Substance and Sexual Activity  . Alcohol  use: Yes  . Drug use: No  . Sexual activity: No  Other Topics Concern  . None  Social History Narrative   Retired former Lawyer, assoc. Degree    Moved from [place]    Wears seat belts, no guns at home    No exercise    Additional Social History:    Allergies:   Allergies  Allergen Reactions  . Penicillins     ?rxn   . Statins     ? Rxn cant tolerate crestor and lipitor noted prev records.     Labs:  Results for orders placed or performed during the hospital encounter of 10/31/17 (from the past 48 hour(s))  Glucose, capillary     Status: Abnormal   Collection Time: 11/01/17  3:51 PM  Result Value Ref Range   Glucose-Capillary 112 (H) 65 - 99 mg/dL  Lactic acid, plasma     Status: Abnormal   Collection Time: 11/01/17  5:45 PM  Result Value Ref Range   Lactic Acid, Venous 4.6 (HH) 0.5 - 1.9 mmol/L    Comment: CRITICAL RESULT CALLED TO, READ BACK BY AND VERIFIED WITH TAMMY TODD 11/01/17 1826 KLW Performed at [facility], [address] [place], [place] [address]   Glucose, capillary     Status: Abnormal   Collection Time: 11/01/17  6:16 PM  Result Value Ref Range   Glucose-Capillary 126 (H) 65 - 99 mg/dL  Glucose, capillary     Status: Abnormal   Collection Time: 11/01/17  7:55 PM  Result Value Ref Range   Glucose-Capillary 130 (H) 65 - 99 mg/dL  Glucose, capillary     Status: Abnormal   Collection Time: 11/01/17 11:43 PM  Result Value Ref Range   Glucose-Capillary 109 (H) 65 - 99 mg/dL   Comment 1 Notify RN   Glucose, capillary     Status: Abnormal   Collection Time: 11/02/17  3:49 AM  Result Value Ref Range   Glucose-Capillary 107 (H) 65 - 99 mg/dL   Comment 1 Notify RN   Glucose, capillary     Status: None   Collection Time: 11/02/17  7:58 AM  Result Value Ref Range   Glucose-Capillary 93 65 - 99 mg/dL   Comment 1 Notify RN   Basic metabolic panel     Status: Abnormal   Collection Time: 11/02/17  8:08 AM  Result Value Ref  Range   Sodium 140 135 - 145 mmol/L   Potassium 3.8 3.5 - 5.1 mmol/L   Chloride 103 101 - 111 mmol/L   CO2 26 22 - 32 mmol/L   Glucose, Bld 108 (H) 65 - 99 mg/dL   BUN 41 (H) 6 - 20 mg/dL   Creatinine, Ser 2.53 (H) 0.44 - 1.00 mg/dL   Calcium 8.2 (L) 8.9 - 10.3 mg/dL   GFR calc non Af Amer 16 (L) >60 mL/min  GFR calc Af Amer 19 (L) >60 mL/min    Comment: (NOTE) The eGFR has been calculated using the CKD EPI equation. This calculation has not been validated in all clinical situations. eGFR's persistently <60 mL/min signify possible Chronic Kidney Disease.    Anion gap 11 5 - 15    Comment: Performed at [facility], [address] [place], [place] [address]  Lactic acid, plasma     Status: None   Collection Time: 11/02/17  8:08 AM  Result Value Ref Range   Lactic Acid, Venous 1.9 0.5 - 1.9 mmol/L    Comment: Performed at [facility], [address] [place], [place] [address]  Glucose, capillary     Status: Abnormal   Collection Time: 11/02/17  5:02 PM  Result Value Ref Range   Glucose-Capillary 106 (H) 65 - 99 mg/dL   Comment 1 Notify RN   Glucose, capillary     Status: Abnormal   Collection Time: 11/02/17  9:38 PM  Result Value Ref Range   Glucose-Capillary 113 (H) 65 - 99 mg/dL   Comment 1 Notify RN    Comment 2 Document in Chart   Renal function panel     Status: Abnormal   Collection Time: 11/03/17  5:17 AM  Result Value Ref Range   Sodium 142 135 - 145 mmol/L   Potassium 3.6 3.5 - 5.1 mmol/L   Chloride 99 (L) 101 - 111 mmol/L   CO2 31 22 - 32 mmol/L   Glucose, Bld 126 (H) 65 - 99 mg/dL   BUN 33 (H) 6 - 20 mg/dL   Creatinine, Ser 1.86 (H) 0.44 - 1.00 mg/dL   Calcium 8.6 (L) 8.9 - 10.3 mg/dL   Phosphorus 2.8 2.5 - 4.6 mg/dL   Albumin 3.5 3.5 - 5.0 g/dL   GFR calc non Af Amer 23 (L) >60 mL/min   GFR calc Af Amer 27 (L) >60 mL/min    Comment: (NOTE) The eGFR has been calculated using the CKD EPI equation. This calculation has not been  validated in all clinical situations. eGFR's persistently <60 mL/min signify possible Chronic Kidney Disease.    Anion gap 12 5 - 15    Comment: Performed at [facility], [address] [place], [place] [address]  Glucose, capillary     Status: Abnormal   Collection Time: 11/03/17  8:08 AM  Result Value Ref Range   Glucose-Capillary 128 (H) 65 - 99 mg/dL   Comment 1 Notify RN   Glucose, capillary     Status: Abnormal   Collection Time: 11/03/17 11:49 AM  Result Value Ref Range   Glucose-Capillary 108 (H) 65 - 99 mg/dL   Comment 1 Notify RN     Current Facility-Administered Medications  Medication Dose Route Frequency Provider Last Rate Last Dose  . acetaminophen (TYLENOL) tablet 650 mg  650 mg Oral Q6H PRN Saundra Shelling, MD       Or  . acetaminophen (TYLENOL) suppository 650 mg  650 mg Rectal Q6H PRN Pyreddy, Reatha Harps, MD      . heparin injection 5,000 Units  5,000 Units Subcutaneous Q8H Marcelle Overlie D, RPH   5,000 Units at 11/03/17 1505  . hydrALAZINE (APRESOLINE) injection 10 mg  10 mg Intravenous Q4H PRN Pyreddy, Reatha Harps, MD      . ondansetron (ZOFRAN) tablet 4 mg  4 mg Oral Q6H PRN Pyreddy, Pavan, MD       Or  . ondansetron (ZOFRAN) injection 4 mg  4 mg  Intravenous Q6H PRN Saundra Shelling, MD   4 mg at 10/31/17 1808  . senna-docusate (Senokot-S) tablet 1 tablet  1 tablet Oral QHS PRN Pyreddy, Pavan, MD      . sodium chloride flush (NS) 0.9 % injection 3 mL  3 mL Intravenous Q12H Henreitta Leber, MD   3 mL at 11/03/17 1041  . sodium chloride flush (NS) 0.9 % injection 3 mL  3 mL Intravenous Q12H Henreitta Leber, MD   3 mL at 11/03/17 1040    Musculoskeletal: Strength & Muscle Tone: within normal limits Gait & Station: normal Patient leans: N/A  Psychiatric Specialty Exam: Physical Exam  Nursing note and vitals reviewed. Constitutional: She appears well-developed and well-nourished.  HENT:  Head: Normocephalic and atraumatic.  Eyes: Conjunctivae are normal.  Pupils are equal, round, and reactive to light.  Neck: Normal range of motion.  Cardiovascular: Regular rhythm and normal heart sounds.  Respiratory: Effort normal. No respiratory distress.  GI: Soft. She exhibits no distension.  Musculoskeletal: Normal range of motion.  Neurological: She is alert.  Skin: Skin is warm and dry.  Psychiatric: Judgment normal. Her affect is blunt. Her speech is delayed. She is slowed. Cognition and memory are normal. She expresses suicidal ideation. She expresses no suicidal plans.    Review of Systems  Constitutional: Negative.   HENT: Negative.   Eyes: Negative.   Respiratory: Negative.   Cardiovascular: Negative.   Gastrointestinal: Negative.   Musculoskeletal: Negative.   Skin: Negative.   Neurological: Negative.   Psychiatric/Behavioral: Positive for depression and suicidal ideas. Negative for hallucinations, memory loss and substance abuse. The patient is nervous/anxious. The patient does not have insomnia.     Blood pressure (!) 136/59, pulse 68, temperature 98.5 F (36.9 C), temperature source Oral, resp. rate 16, height _0  (1.575 m), weight 80.3 kg (177 lb), SpO2 96 %.Body mass index is 32.37 kg/m.  General Appearance: Casual  Eye Contact:  Good  Speech:  Slow  Volume:  Decreased  Mood:  Depressed and Dysphoric  Affect:  Blunt  Thought Process:  Goal Directed  Orientation:  Full (Time, Place, and Person)  Thought Content:  Logical  Suicidal Thoughts:  Yes.  without intent/plan  Homicidal Thoughts:  No  Memory:  Immediate;   Good Recent;   Fair Remote;   Fair  Judgement:  Impaired  Insight:  Shallow  Psychomotor Activity:  Normal  Concentration:  Concentration: Fair  Recall:  [place] of Knowledge:  Good  Language:  Fair  Akathisia:  No  Handed:  Right  AIMS (if indicated):     Assets:  Desire for Improvement Housing Resilience Social Support  ADL's:  Intact  Cognition:  WNL  Sleep:        Treatment Plan  Summary: Daily contact with patient to assess and evaluate symptoms and progress in treatment, Medication management and Plan 84 year old woman status post suicide attempt.  Multiple symptoms of major depression.  Despite her age the patient has minimal or no indication of dementia.  Patient is ambulatory without assistance.  She is in very good health for her age.  Her overall mental and physical functioning is consistent with appropriate admission to our psychiatric ward.  Given that a geriatric bed had not been found I am recommending transfer to the inpatient psychiatric ward at [facility]  Continue IV C.  Starting citalopram 10 mg/day initial treatment for depression.  Notified TTS to list the patient for admission downstairs as soon  as the next bed space is available.  Patient and family notified.  Disposition: Recommend psychiatric Inpatient admission when medically cleared.  Alethia Berthold, MD 11/03/2017 3:30 PM

## 2017-11-03 NOTE — Clinical Social Work Note (Signed)
No geri psych hospitals offering to take patient at this time. Will try and call each one this afternoon to see. Patient is wanting to discharge home according to the attending physician. Shela Leff MSW,LCSW [phone]

## 2017-11-03 NOTE — Clinical Social Work Note (Signed)
CSW has reviewed psychiatry documentation and patient is now to go to [place] unit when bed available. Attending physician is aware. Shela Leff MSW,LCSW [phone]

## 2017-11-03 NOTE — Telephone Encounter (Signed)
pt sister would like to speak with you about her sister. please call [phone]

## 2017-11-03 NOTE — Clinical Social Work Note (Signed)
CSW spoke to patient and explained what IVC meant.  Patient says she wants to go home, CSW explained to her that she can not legally go home right now unless physician decides she is safe to discharge and releases IVC.  Patient expressed frustration, and was upset about not being able to discharge back home.  CSW informed her that information has been faxed to local geripsych facilities, awaiting bed offers.  Patient's sister was at bedside, and is aware of current process of trying a facility once she is medically cleared.    Jones Broom. [place], MSW, [place]

## 2017-11-04 LAB — BASIC METABOLIC PANEL
Anion gap: 9 (ref 5–15)
BUN: 22 mg/dL — AB (ref 6–20)
CHLORIDE: 104 mmol/L (ref 101–111)
CO2: 28 mmol/L (ref 22–32)
CREATININE: 1.34 mg/dL — AB (ref 0.44–1.00)
Calcium: 8.5 mg/dL — ABNORMAL LOW (ref 8.9–10.3)
GFR calc Af Amer: 40 mL/min — ABNORMAL LOW (ref >60)
GFR calc non Af Amer: 35 mL/min — ABNORMAL LOW (ref >60)
GLUCOSE: 130 mg/dL — AB (ref 65–99)
Potassium: 3.6 mmol/L (ref 3.5–5.1)
SODIUM: 141 mmol/L (ref 135–145)

## 2017-11-04 NOTE — Care Management Important Message (Signed)
Important Message  Patient Details  Name: Lisa Campbell MRN: [id number] Date of Birth: 10/04/1927   Medicare Important Message Given:  Yes Signed IM notice given    Katrina Stack, RN 11/04/2017, 2:50 PM

## 2017-11-04 NOTE — Progress Notes (Signed)
[facility]  ROUNDING NOTE   Subjective:   Sitter at bedside Creatinine 1.34 (1.86) (2.53) (1.7)  Off IV fluids  Started on citalopram  Objective:  Vital signs in last 24 hours:  Temp:  [98.6 F (37 C)-98.7 F (37.1 C)] 98.7 F (37.1 C) (03/16 0751) Pulse Rate:  [65-75] 70 (03/16 0751) Resp:  [17-18] 18 (03/16 0751) BP: (124-143)/(54-61) 132/55 (03/16 0751) SpO2:  [95 %-97 %] 97 % (03/16 0751)  Weight change:  Filed Weights   10/31/17 1415  Weight: 80.3 kg (177 lb)    Intake/Output: I/O last 3 completed shifts: In: 814.2 [P.O.:360; I.V.:454.2] Out: -    Intake/Output this shift:  Total I/O In: 480 [P.O.:480] Out: -   Physical Exam: General: NAD,   Head: Normocephalic, atraumatic. Moist oral mucosal membranes  Eyes: Anicteric, PERRL  Neck: Supple, trachea midline  Lungs:  Clear to auscultation  Heart: Regular rate and rhythm  Abdomen:  Soft, nontender  Extremities:  no peripheral edema.  Neurologic: Nonfocal, moving all four extremities  Skin: No lesions       Basic Metabolic Panel: Recent Labs  Lab 10/31/17 1440 11/01/17 0556 11/02/17 0808 11/03/17 0517 11/04/17 0647  NA 139 142 140 142 141  K 3.9 5.3* 3.8 3.6 3.6  CL 105 106 103 99* 104  CO2 20* 14* 26 31 28   GLUCOSE 167* 134* 108* 126* 130*  BUN 16 21* 41* 33* 22*  CREATININE 1.36* 1.70* 2.53* 1.86* 1.34*  CALCIUM 8.8* 8.3* 8.2* 8.6* 8.5*  MG 1.2* 2.2  --   --   --   PHOS  --   --   --  2.8  --     Liver Function Tests: Recent Labs  Lab 10/31/17 1440 11/03/17 0517  AST 30  --   ALT 13*  --   ALKPHOS 59  --   BILITOT 1.9*  --   PROT 6.6  --   ALBUMIN 3.7 3.5   No results for input(s): LIPASE, AMYLASE in the last 168 hours. No results for input(s): AMMONIA in the last 168 hours.  CBC: Recent Labs  Lab 10/31/17 1440 11/01/17 0556  WBC 12.3* 13.4*  NEUTROABS 10.3*  --   HGB 11.9* 12.3  HCT 35.6 37.9  MCV 96.8 99.1  PLT 327 296    Cardiac Enzymes: No  results for input(s): CKTOTAL, CKMB, CKMBINDEX, TROPONINI in the last 168 hours.  BNP: Invalid input(s): POCBNP  CBG: Recent Labs  Lab 11/02/17 1702 11/02/17 2138 11/03/17 0808 11/03/17 1149 11/03/17 1627  GLUCAP 106* 113* 128* 108* 112*    Microbiology: No results found for this or any previous visit.  Coagulation Studies: No results for input(s): LABPROT, INR in the last 72 hours.  Urinalysis: No results for input(s): COLORURINE, LABSPEC, PHURINE, GLUCOSEU, HGBUR, BILIRUBINUR, KETONESUR, PROTEINUR, UROBILINOGEN, NITRITE, LEUKOCYTESUR in the last 72 hours.  Invalid input(s): APPERANCEUR    Imaging: No results found.   Medications:    . citalopram  10 mg Oral Daily  . heparin injection (subcutaneous)  5,000 Units Subcutaneous Q8H  . sodium chloride flush  3 mL Intravenous Q12H  . sodium chloride flush  3 mL Intravenous Q12H   acetaminophen **OR** acetaminophen, hydrALAZINE, ondansetron **OR** ondansetron (ZOFRAN) IV, senna-docusate  Assessment/ Plan:  Ms. Lisa Campbell is a 84 y.o. white female with hypertension, diabetes mellitus type II, peptic ulcer disease/GERD, hyperlipidemia, who was admitted to [facility] on 10/31/2017 for suicide attempt and overdose of metformin.   1. Acute renal  failure on chronic kidney disease stage III with proteinuria: baseline creatinine of 1.38, GFR of 38 on 09/27/16.  Acute renal failure from metformin toxicity.  Chronic kidney disease secondary to diabetes and hypertension Creatinine at baseline.  - Encourage PO intake.   2. Metabolic acidosis: lactic acidosis secondary to metformin toxicity.    3. Hypertension: blood pressure at goal. Holding losartan and hydrochlorothiazide.   4. Diabetes mellitus type II with chronic kidney disease: hold metformin.   5. Depression: started on citalopram. Monitor sodium levels.  - Appreciate psych input.   Will need outpatient follow up.    LOS: 4 Lisa Campbell 3/16/201910:47 AM

## 2017-11-04 NOTE — Progress Notes (Signed)
Patient ID: Lisa Campbell, female   DOB: [birth year], 84 y.o.   MRN: [id number]  [facility] [employer] PROGRESS NOTE  Lisa Campbell CZY:[id number] DOB: [birth year] DOA: 10/31/2017 PCP: McLean-Scocuzza, Nino Glow, MD  HPI/Subjective: Patient physically feels fine.  She states she took too many pills.  She states she does not like the food here.  Objective: Vitals:   11/04/17 0531 11/04/17 0751  BP: 125/60 (!) 132/55  Pulse: 65 70  Resp: 18 18  Temp: 98.7 F (37.1 C) 98.7 F (37.1 C)  SpO2: 97% 97%    Filed Weights   10/31/17 1415  Weight: 80.3 kg (177 lb)    ROS: Review of Systems  Constitutional: Negative for chills and fever.  Eyes: Negative for blurred vision.  Respiratory: Negative for cough and shortness of breath.   Cardiovascular: Negative for chest pain.  Gastrointestinal: Negative for abdominal pain, constipation, diarrhea, nausea and vomiting.  Genitourinary: Negative for dysuria.  Musculoskeletal: Negative for joint pain.  Neurological: Negative for dizziness and headaches.   Exam: Physical Exam  HENT:  Nose: No mucosal edema.  Mouth/Throat: No oropharyngeal exudate or posterior oropharyngeal edema.  Eyes: Conjunctivae, EOM and lids are normal. Pupils are equal, round, and reactive to light.  Neck: No JVD present. Carotid bruit is not present. No edema present. No thyroid mass and no thyromegaly present.  Cardiovascular: S1 normal and S2 normal. Exam reveals no gallop.  No murmur heard. Pulses:      Dorsalis pedis pulses are 2+ on the right side, and 2+ on the left side.  Respiratory: No respiratory distress. She has no wheezes. She has no rhonchi. She has no rales.  GI: Soft. Bowel sounds are normal. There is no tenderness.  Musculoskeletal:       Right ankle: She exhibits swelling.       Left ankle: She exhibits swelling.  Lymphadenopathy:    She has no cervical adenopathy.  Neurological: She is alert. No cranial nerve deficit.  Skin: Skin is warm. No rash  noted. Nails show no clubbing.  Psychiatric: She has a normal mood and affect.      Data Reviewed: Basic Metabolic Panel: Recent Labs  Lab 10/31/17 1440 11/01/17 0556 11/02/17 0808 11/03/17 0517 11/04/17 0647  NA 139 142 140 142 141  K 3.9 5.3* 3.8 3.6 3.6  CL 105 106 103 99* 104  CO2 20* 14* 26 31 28   GLUCOSE 167* 134* 108* 126* 130*  BUN 16 21* 41* 33* 22*  CREATININE 1.36* 1.70* 2.53* 1.86* 1.34*  CALCIUM 8.8* 8.3* 8.2* 8.6* 8.5*  MG 1.2* 2.2  --   --   --   PHOS  --   --   --  2.8  --    Liver Function Tests: Recent Labs  Lab 10/31/17 1440 11/03/17 0517  AST 30  --   ALT 13*  --   ALKPHOS 59  --   BILITOT 1.9*  --   PROT 6.6  --   ALBUMIN 3.7 3.5   CBC: Recent Labs  Lab 10/31/17 1440 11/01/17 0556  WBC 12.3* 13.4*  NEUTROABS 10.3*  --   HGB 11.9* 12.3  HCT 35.6 37.9  MCV 96.8 99.1  PLT 327 296    CBG: Recent Labs  Lab 11/02/17 1702 11/02/17 2138 11/03/17 0808 11/03/17 1149 11/03/17 1627  GLUCAP 106* 113* 128* 108* 112*     Scheduled Meds: . citalopram  10 mg Oral Daily  . heparin injection (subcutaneous)  5,000 Units Subcutaneous Q8H  .  sodium chloride flush  3 mL Intravenous Q12H  . sodium chloride flush  3 mL Intravenous Q12H   Continuous Infusions:  Assessment/Plan:  1. Lactic acidosis secondary to metformin overdose.  Improved with IV fluids.   2. Acute kidney injury.  Improved with IV fluids.  Now off fluids. 3. Depression with suicide attempt.  Patient took multiple pills of metformin and Januvia.  Patient seen by psychiatry and they have the patient on one-to-one sitter.  Psychiatry wanted to take the patient downstairs as of yesterday's note.  I spoke with psychiatry today to see if there are beds.  The psychiatry unit called the patient's nurse and stated that they will not take the patient because she is 69.  At this time I have no plan for this patient because she is one-to-one psych patient.  Patient on low-dose  Celexa. 4. Essential hypertension blood pressure stable off medications at this point.  Code Status:     Code Status Orders  (From admission, onward)        Start     Ordered   10/31/17 1749  Do not attempt resuscitation (DNR)  Continuous    Question Answer Comment  In the event of cardiac or respiratory ARREST Do not call a "code blue"   In the event of cardiac or respiratory ARREST Do not perform Intubation, CPR, defibrillation or ACLS   In the event of cardiac or respiratory ARREST Use medication by any route, position, wound care, and other measures to relive pain and suffering. May use oxygen, suction and manual treatment of airway obstruction as needed for comfort.      10/31/17 1748    Code Status History    Date Active Date Inactive Code Status Order ID Comments User Context   10/31/2017 16:10 10/31/2017 17:48 DNR [id number]  Merlyn Lot, MD ED    Advance Directive Documentation     Most Recent Value  Type of Advance Directive  Out of facility DNR (pink MOST or yellow form)  Pre-existing out of facility DNR order (yellow form or pink MOST form)  No data  "MOST" Form in Place?  No data     Disposition Plan: No plans at this time.  Yesterday's plan was for the patient to be accepted down to the psychiatry floor but apparently this is not  going to happen.  Consultants:  Psychiatry  Time spent: 28 minutes  [name]

## 2017-11-04 NOTE — Care Management Important Message (Signed)
Important Message  Patient Details  Name: Lisa Campbell MRN: [id number] Date of Birth: Jan 15, 1928   Medicare Important Message Given:    Signed IM notice given    Katrina Stack, RN 11/04/2017, 8:19 AM

## 2017-11-05 ENCOUNTER — Other Ambulatory Visit: Payer: Self-pay

## 2017-11-05 ENCOUNTER — Inpatient Hospital Stay
Admission: AD | Admit: 2017-11-05 | Discharge: 2017-11-09 | DRG: 885 | Disposition: A | Discharge status: Skilled Nursing Facility | Payer: Medicare Other | Attending: Psychiatry | Admitting: Psychiatry

## 2017-11-05 ENCOUNTER — Encounter: Payer: Self-pay | Admitting: Behavioral Health

## 2017-11-05 DIAGNOSIS — E872 Acidosis: Secondary | ICD-10-CM | POA: Diagnosis not present

## 2017-11-05 DIAGNOSIS — E785 Hyperlipidemia, unspecified: Secondary | ICD-10-CM | POA: Diagnosis present

## 2017-11-05 DIAGNOSIS — T50902A Poisoning by unspecified drugs, medicaments and biological substances, intentional self-harm, initial encounter: Secondary | ICD-10-CM | POA: Diagnosis not present

## 2017-11-05 DIAGNOSIS — R7611 Nonspecific reaction to tuberculin skin test without active tuberculosis: Secondary | ICD-10-CM | POA: Diagnosis not present

## 2017-11-05 DIAGNOSIS — I10 Essential (primary) hypertension: Secondary | ICD-10-CM | POA: Diagnosis present

## 2017-11-05 DIAGNOSIS — Z7984 Long term (current) use of oral hypoglycemic drugs: Secondary | ICD-10-CM

## 2017-11-05 DIAGNOSIS — Z79899 Other long term (current) drug therapy: Secondary | ICD-10-CM

## 2017-11-05 DIAGNOSIS — Z915 Personal history of self-harm: Secondary | ICD-10-CM

## 2017-11-05 DIAGNOSIS — Z85828 Personal history of other malignant neoplasm of skin: Secondary | ICD-10-CM | POA: Diagnosis not present

## 2017-11-05 DIAGNOSIS — Z880 Allergy status to penicillin: Secondary | ICD-10-CM | POA: Diagnosis not present

## 2017-11-05 DIAGNOSIS — M109 Gout, unspecified: Secondary | ICD-10-CM | POA: Diagnosis present

## 2017-11-05 DIAGNOSIS — R45851 Suicidal ideations: Secondary | ICD-10-CM | POA: Diagnosis present

## 2017-11-05 DIAGNOSIS — Z888 Allergy status to other drugs, medicaments and biological substances status: Secondary | ICD-10-CM | POA: Diagnosis not present

## 2017-11-05 DIAGNOSIS — N179 Acute kidney failure, unspecified: Secondary | ICD-10-CM | POA: Diagnosis not present

## 2017-11-05 DIAGNOSIS — A159 Respiratory tuberculosis unspecified: Secondary | ICD-10-CM

## 2017-11-05 DIAGNOSIS — F322 Major depressive disorder, single episode, severe without psychotic features: Principal | ICD-10-CM | POA: Diagnosis present

## 2017-11-05 DIAGNOSIS — N183 Chronic kidney disease, stage 3 unspecified: Secondary | ICD-10-CM

## 2017-11-05 DIAGNOSIS — E119 Type 2 diabetes mellitus without complications: Secondary | ICD-10-CM | POA: Diagnosis present

## 2017-11-05 DIAGNOSIS — E1122 Type 2 diabetes mellitus with diabetic chronic kidney disease: Secondary | ICD-10-CM

## 2017-11-05 DIAGNOSIS — T1491XA Suicide attempt, initial encounter: Secondary | ICD-10-CM | POA: Diagnosis present

## 2017-11-05 LAB — BASIC METABOLIC PANEL
Anion gap: 9 (ref 5–15)
BUN: 22 mg/dL — ABNORMAL HIGH (ref 6–20)
CHLORIDE: 106 mmol/L (ref 101–111)
CO2: 25 mmol/L (ref 22–32)
CREATININE: 1.2 mg/dL — AB (ref 0.44–1.00)
Calcium: 8.8 mg/dL — ABNORMAL LOW (ref 8.9–10.3)
GFR calc non Af Amer: 39 mL/min — ABNORMAL LOW (ref >60)
GFR, EST AFRICAN AMERICAN: 46 mL/min — AB (ref >60)
Glucose, Bld: 162 mg/dL — ABNORMAL HIGH (ref 65–99)
Potassium: 4.8 mmol/L (ref 3.5–5.1)
Sodium: 140 mmol/L (ref 135–145)

## 2017-11-05 LAB — GLUCOSE, CAPILLARY: Glucose-Capillary: 135 mg/dL — ABNORMAL HIGH (ref 65–99)

## 2017-11-05 MED ORDER — MAGNESIUM HYDROXIDE 400 MG/5ML PO SUSP: 30.0000 mL | Freq: Every day | ORAL | Status: DC | PRN

## 2017-11-05 MED ORDER — ACETAMINOPHEN 650 MG RE SUPP: 650.0000 mg | Freq: Four times a day (QID) | RECTAL | Status: DC | PRN

## 2017-11-05 MED ORDER — ALUM & MAG HYDROXIDE-SIMETH 200-200-20 MG/5ML PO SUSP: 30.0000 mL | ORAL | Status: DC | PRN

## 2017-11-05 MED ORDER — CITALOPRAM HYDROBROMIDE 10 MG PO TABS: 10.0000 mg | ORAL_TABLET | Freq: Every day | ORAL | Status: DC

## 2017-11-05 MED ORDER — ACETAMINOPHEN 325 MG PO TABS: 650.0000 mg | ORAL_TABLET | Freq: Four times a day (QID) | ORAL | Status: DC | PRN

## 2017-11-05 MED ORDER — CITALOPRAM HYDROBROMIDE 20 MG PO TABS
10.0000 mg | ORAL_TABLET | Freq: Every day | ORAL | Status: DC
  Administered 2017-11-05 – 2017-11-09 (×5): 10 mg via ORAL
  Filled 2017-11-05 (×5): qty 1

## 2017-11-05 MED ORDER — SENNOSIDES-DOCUSATE SODIUM 8.6-50 MG PO TABS
1.0000 | ORAL_TABLET | Freq: Every evening | ORAL | Status: DC | PRN
  Filled 2017-11-05: qty 1

## 2017-11-05 MED ORDER — TRAZODONE HCL 50 MG PO TABS
50.0000 mg | ORAL_TABLET | Freq: Every evening | ORAL | Status: DC | PRN
  Filled 2017-11-05: qty 1

## 2017-11-05 NOTE — Progress Notes (Signed)
D:Pt. Verbally is able to contract for safety. Pt. Denies SI/HI. Pt. States she can remain safe while on the unit. Pt is pleasant and cooperative. Pt. has no Complaints.  Patient Interaction is appropriate, but minimal. Pt. Isolative and withdrawn this evening to her room and states she just wants to sleep. Did not want to come out for snack time. Denies all psych symptoms, but appears flat.  A: Q x 15 minute observation checks were completed for safety. Patient was provided with education. Patient  was encourage to attend groups, participate in unit activities and continue with plan of care.   R:Patient is complaint with unit procedures. Did not attend groups.            Precautionary checks every 15 minutes for safety maintained, room free of safety hazards, patient sustains no injury or falls during this shift.

## 2017-11-05 NOTE — Progress Notes (Signed)
Pt discharge to beh med. IV discontinued without incident. Report called to beh med.

## 2017-11-05 NOTE — Progress Notes (Signed)
Patient ID: Lisa Campbell, female   DOB: Dec 01, 1927, 84 y.o.   MRN: [id number] Per State regulations 482.30 this chart was reviewed for medical necessity with respect to the patient's admission/duration of stay.    Next review date: 11/09/17  Debarah Crape, BSN, RN-BC  Case Manager

## 2017-11-05 NOTE — BHH Suicide Risk Assessment (Signed)
[facility] Admission Suicide Risk Assessment   Nursing information obtained from:  Patient Demographic factors:  Age 84 or older, Caucasian Current Mental Status:  Self-harm behaviors Loss Factors:  NA Historical Factors:  Prior suicide attempts Risk Reduction Factors:  Positive social support  Total Time spent with patient: 1 hour Principal Problem: <principal problem not specified> Diagnosis:   Patient Active Problem List   Diagnosis Date Noted  . Altered mental status [R41.82] 10/31/2017  . Suicide attempt ([place]) [T14.91XA] 10/31/2017  . Severe major depression, single episode, without psychotic features ([place]) [F32.2] 10/31/2017  . HTN (hypertension) [I10] 09/30/2017  . GERD (gastroesophageal reflux disease) [K21.9] 09/30/2017  . Gout [M10.9] 09/30/2017  . Controlled type 2 diabetes mellitus without complication, without long-term current use of insulin ([place]) [E11.9] 09/27/2017  . Vitamin D deficiency [E55.9] 09/27/2017  . History of skin cancer [Z85.828] 09/27/2017   Subjective Data:  84 year old woman with no  past psychiatric history admitted from medical floor. Pt admitted under IVC.  Per report, and per psych consult note - pt comes into the hospital after taking an overdose of her diabetes medicine( metformin and Januvia). According to the collateral history from her family she and her sister had an argument this afternoon after which the patient locked herself in her bedroom. When the door was broken through patient was showing altered mental status and had taken a large overdose of her oral diabetes medicines. Reportedly patient stated here in the emergency room that she did it in order to die. Patient tells me that she has not been feeling well ever since she moved down to [place] in January. She wishes she had stayed in [place]. Her sleep habits are poor. She is not eating well. She feels tired all the time. Does not feel like she has anything to do. Seems to  feel a great deal of regret. She denies it when I ask her directly if she had had thoughts about killing herself but it seems obvious from her behavior that this was an impulsive suicide attempt. Patient is apparently not receiving any mental health treatment.  Pt calm, ox3, reports feeling depressed after moving from [place] in January to be near by her sister, currently living in  [facility]. . Pt reports " I lived all my  life in [place] and miss home", states she sold  her house  couple of months ago.  Pt admits wanting to kill self at that time, but states "that was the wrong thing to do", denies SI  at this time, Denies HI. Denies AVH. Denies past psych hx or tx. Denies substance use issues. Pt already started on Celexa, pt taking it, denies side effects.     Continued Clinical Symptoms:  Alcohol Use Disorder Identification Test Final Score (AUDIT): 1 The "Alcohol Use Disorders Identification Test", Guidelines for Use in Primary Care, Second Edition.  World Pharmacologist [facility]). Score between 0-7:  no or low risk or alcohol related problems. Score between 8-15:  moderate risk of alcohol related problems. Score between 16-19:  high risk of alcohol related problems. Score 20 or above:  warrants further diagnostic evaluation for alcohol dependence and treatment.   CLINICAL FACTORS:   Depression:   Severe,  poor support  Musculoskeletal: Strength & Muscle Tone: within normal limits Gait & Station: normal Patient leans:   Psychiatric Specialty Exam: Physical Exam  Nursing note and vitals reviewed.   ROS  Blood pressure 138/62, pulse 72, temperature 98.1 F (36.7  C), temperature source Oral, resp. rate 17, height 5\' 1"  (1.549 m), weight 80.3 kg (177 lb 0.5 oz), SpO2 100 %.Body mass index is 33.45 kg/m.  General Appearance:Casual  Eye Contact:fair  Speech:Slow  Volume:Decreased  Mood:Depressed and Dysphoric  Affect:restricted  Thought  Process:Goal Directed  Orientation:Full (Time, Place, and Person)  Thought Content:Logical  Suicidal Thoughts:denies at this time  Homicidal Thoughts:No  Memory:Immediate;Good Recent;Fair Remote;[place]  Akathisia:No  Handed:Right  AIMS (if indicated):   Assets:Desire for Improvement Housing Resilience Social Support  ADL's:Intact  Cognition:WNL  Sleep:          COGNITIVE FEATURES THAT CONTRIBUTE TO RISK:  Depressive cognitions    SUICIDE RISK:   severe/high   PLAN OF CARE:  Daily contact with patient to assess and evaluate symptoms and progress in treatment and Medication management  84 year old woman status post suicide attempt with OD on diabetes pills.  Pt is depressed.  Continue IVC.   citalopram just started 10 mg/day, cont it.    Observation Level/Precautions:  15 minute checks  Laboratory:  CBC Chemistry Profile UDS UA, TSH, EKG  Psychotherapy:    Medications:    Consultations:    Discharge Concerns:    Estimated LOS:  Other:     Physician Treatment Plan for Primary Diagnosis: <principal problem not specified> Long Term Goal(s): Improvement in symptoms so as ready for discharge  Short Term Goals: Ability to identify changes in lifestyle to reduce recurrence of condition will improve, Ability to verbalize feelings will improve, Ability to disclose and discuss suicidal ideas, Ability to demonstrate self-control will improve, Ability to identify and develop effective coping behaviors will improve, Ability to maintain clinical measurements within normal limits will improve, Compliance with prescribed medications will improve and Ability to identify triggers associated with substance abuse/mental health issues will improve  Physician Treatment Plan  for Secondary Diagnosis: Active Problems:   Severe major depression, single episode, without psychotic features ([place])  Long Term Goal(s): Improvement in symptoms so as ready for discharge  Short Term Goals: Ability to identify changes in lifestyle to reduce recurrence of condition will improve, Ability to verbalize feelings will improve, Ability to disclose and discuss suicidal ideas, Ability to demonstrate self-control will improve, Ability to identify and develop effective coping behaviors will improve, Ability to maintain clinical measurements within normal limits will improve, Compliance with prescribed medications will improve and Ability to identify triggers associated with substance abuse/mental health issues will improve     I certify that inpatient services furnished can reasonably be expected to improve the patient's condition.   Lenward Chancellor, MD 11/05/2017, 2:02 PM

## 2017-11-05 NOTE — BH Assessment (Signed)
Patient is to be admitted to [facility] by Dr. Weber Cooks.  Attending Physician will be Dr. Wonda Olds.   Patient has been assigned to room 309, by [place].   ER staff is aware of the admission:   Tammy, Patient's Nurse   Donivan Scull, Patient Access.

## 2017-11-05 NOTE — Plan of Care (Signed)
Pt. Isolative and withdrawn this evening to her room frequently. Did not want to come out for snack time. Pt. Verbally is able to contract for safety. Pt. Denies SI/HI. Pt. States she can remain safe while on the unit.  Not Progressing Activity: Interest or engagement in leisure activities will improve 11/05/2017 2150 - Not Progressing by Reyes Ivan, RN   Progressing Safety: Ability to remain free from injury will improve 11/05/2017 2150 - Progressing by Reyes Ivan, RN 11/05/2017 2150 - Progressing by Reyes Ivan, RN

## 2017-11-05 NOTE — H&P (Signed)
Psychiatric Admission Assessment Adult  Patient Identification: Lisa Campbell MRN:  [id number] Date of Evaluation:  11/05/2017 Chief Complaint:  Depression Principal Diagnosis: <principal problem not specified> Diagnosis:   Patient Active Problem List   Diagnosis Date Noted  . Altered mental status [R41.82] 10/31/2017  . Suicide attempt ([place]) [T14.91XA] 10/31/2017  . Severe major depression, single episode, without psychotic features ([place]) [F32.2] 10/31/2017  . HTN (hypertension) [I10] 09/30/2017  . GERD (gastroesophageal reflux disease) [K21.9] 09/30/2017  . Gout [M10.9] 09/30/2017  . Controlled type 2 diabetes mellitus without complication, without long-term current use of insulin ([place]) [E11.9] 09/27/2017  . Vitamin D deficiency [E55.9] 09/27/2017  . History of skin cancer [Z85.828] 09/27/2017   History of Present Illness:   "I moved down from [place], and I missed my home"   84 year old woman with no  past psychiatric history admitted from medical floor. Pt admitted under IVC.  Per report, and per psych consult note - pt comes into the hospital after taking an overdose of her diabetes medicine( metformin and Januvia).  According to the collateral history from her family she and her sister had an argument this afternoon after which the patient locked herself in her bedroom.  When the door was broken through patient was showing altered mental status and had taken a large overdose of her oral diabetes medicines.  Reportedly patient stated here in the emergency room that she did it in order to die.  Patient tells me that she has not been feeling well ever since she moved down to [place] in January.  She wishes she had stayed in [place].  Her sleep habits are poor.  She is not eating well.  She feels tired all the time.  Does not feel like she has anything to do.  Seems to feel a great deal of regret.  She denies it when I ask her directly if she had had thoughts about killing  herself but it seems obvious from her behavior that this was an impulsive suicide attempt.  Patient is apparently not receiving any mental health treatment.  Pt calm, ox3, reports feeling depressed after moving from [place] in January to be near by her sister, currently living in  [facility]. . Pt reports " I lived all my  life in [place] and miss home", states she sold  her house  couple of months ago.  Pt admits wanting to kill self at that time, but states "that was the wrong thing to do", denies SI  at this time, Denies HI. Denies AVH. Denies past psych hx or tx. Denies substance use issues. Pt already started on Celexa, pt taking it, denies side effects.    Associated Signs/Symptoms: Depression Symptoms:  depressed mood, insomnia, fatigue, anxiety, decreased appetite, (Hypo) Manic Symptoms:  no Anxiety Symptoms:  Excessive Worry, Psychotic Symptoms:  denies PTSD Symptoms: denies Total Time spent with patient: 1 hour  Past Psychiatric History:  Patient denies ever seeing a psychiatrist therapist or mental health provider in the past.  Denies ever being on any medicine for depression or anxiety.  Denies any past suicide attempts.    Is the patient at risk to self? Yes.    Has the patient been a risk to self in the past 6 months? Yes.    Has the patient been a risk to self within the distant past? No.  Is the patient a risk to others? No.  Has the patient been a risk to others in the  past 6 months? No.  Has the patient been a risk to others within the distant past? No.   Prior Inpatient Therapy:   Prior Outpatient Therapy:     Substance use hx-Says she drinks a very small drink of liquor 2-3 times a week but claims that alcohol has never been any sort of a problem for her denies any other drug use.   Alcohol Screening: 1. How often do you have a drink containing alcohol?: Monthly or less 2. How many drinks containing alcohol do you have on a typical day when  you are drinking?: 1 or 2 3. How often do you have six or more drinks on one occasion?: Never AUDIT-C Score: 1 4. How often during the last year have you found that you were not able to stop drinking once you had started?: Never 5. How often during the last year have you failed to do what was normally expected from you becasue of drinking?: Never 6. How often during the last year have you needed a first drink in the morning to get yourself going after a heavy drinking session?: Never 7. How often during the last year have you had a feeling of guilt of remorse after drinking?: Never 8. How often during the last year have you been unable to remember what happened the night before because you had been drinking?: Never 9. Have you or someone else been injured as a result of your drinking?: No 10. Has a relative or friend or a doctor or another health worker been concerned about your drinking or suggested you cut down?: No Alcohol Use Disorder Identification Test Final Score (AUDIT): 1 Intervention/Follow-up: AUDIT Score <7 follow-up not indicated Substance Abuse History in the last 12 months:  See above Consequences of Substance Abuse: Negative Previous Psychotropic Medications: No  Psychological Evaluations: No  Past Medical History:  Past Medical History:  Diagnosis Date  . Arthritis   . Cancer ([place])    skin mohs left ear ? 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 Colon polyps   . Gastric ulcer   . GERD (gastroesophageal reflux disease)   . Hyperlipidemia   . Hypertension   . Rotator cuff disorder    right  . Shingles     Past Surgical History:  Procedure Laterality Date  . KNEE ARTHROSCOPY     b/l    Family History:  Family History  Problem Relation Age of Onset  . Cancer Mother        ?colon   . Diabetes Mother   . Early death Mother   . Heart disease Mother   . Hypertension Mother   . Hyperlipidemia Mother   . Cancer Father        ? MM  . Diabetes  Sister   . Diabetes Brother   . Arthritis Brother    Family Psychiatric  History: no Tobacco Screening: Have you used any form of tobacco in the last 30 days? (Cigarettes, Smokeless Tobacco, Cigars, and/or Pipes): No Social History:  Social History   Substance and Sexual Activity  Alcohol Use Yes     Social History   Substance and Sexual Activity  Drug Use No    Additional Social History:      Has no children of her own.  Currently living with her sister in an apartment.  Had been living independently in [place] until a couple months ago.  Allergies:   Allergies  Allergen Reactions  . Penicillins     ?rxn   . Statins     ? Rxn cant tolerate crestor and lipitor noted prev records.    Lab Results:  Results for orders placed or performed during the hospital encounter of 11/05/17 (from the past 48 hour(s))  Basic metabolic panel     Status: Abnormal   Collection Time: 11/05/17 11:44 AM  Result Value Ref Range   Sodium 140 135 - 145 mmol/L   Potassium 4.8 3.5 - 5.1 mmol/L   Chloride 106 101 - 111 mmol/L   CO2 25 22 - 32 mmol/L   Glucose, Bld 162 (H) 65 - 99 mg/dL   BUN 22 (H) 6 - 20 mg/dL   Creatinine, Ser 1.20 (H) 0.44 - 1.00 mg/dL   Calcium 8.8 (L) 8.9 - 10.3 mg/dL   GFR calc non Af Amer 39 (L) >60 mL/min   GFR calc Af Amer 46 (L) >60 mL/min    Comment: (NOTE) The eGFR has been calculated using the CKD EPI equation. This calculation has not been validated in all clinical situations. eGFR's persistently <60 mL/min signify possible Chronic Kidney Disease.    Anion gap 9 5 - 15    Comment: Performed at [facility], [address] [place], [place] [address]    Blood Alcohol level:  No results found for: [facility]  Metabolic Disorder Labs:  Lab Results  Component Value Date   HGBA1C 5.9 09/27/2017   No results found for: PROLACTIN Lab Results  Component Value Date   CHOL 242 (H) 09/27/2017   TRIG 142.0 09/27/2017    HDL 56.60 09/27/2017   CHOLHDL 4 09/27/2017   VLDL 28.4 09/27/2017   LDLCALC 157 (H) 09/27/2017    Current Medications: Current Facility-Administered Medications  Medication Dose Route Frequency Provider Last Rate Last Dose  . acetaminophen (TYLENOL) tablet 650 mg  650 mg Oral Q6H PRN Clapacs, Madie Reno, MD       Or  . acetaminophen (TYLENOL) suppository 650 mg  650 mg Rectal Q6H PRN Clapacs, Madie Reno, MD      . acetaminophen (TYLENOL) tablet 650 mg  650 mg Oral Q6H PRN Clapacs, John T, MD      . alum & mag hydroxide-simeth (MAALOX/MYLANTA) 200-200-20 MG/5ML suspension 30 mL  30 mL Oral Q4H PRN Clapacs, John T, MD      . citalopram (CELEXA) tablet 10 mg  10 mg Oral Daily Clapacs, John T, MD      . magnesium hydroxide (MILK OF MAGNESIA) suspension 30 mL  30 mL Oral Daily PRN Clapacs, John T, MD      . senna-docusate (Senokot-S) tablet 1 tablet  1 tablet Oral QHS PRN Clapacs, John T, MD      . traZODone (DESYREL) tablet 50 mg  50 mg Oral QHS PRN Clapacs, Madie Reno, MD       PTA Medications: Medications Prior to Admission  Medication Sig Dispense Refill Last Dose  . Cholecalciferol (VITAMIN D3) 2000 units TABS Take 2,000 Units by mouth daily.    N/A at N/A  . citalopram (CELEXA) 10 MG tablet Take 1 tablet (10 mg total) by mouth daily.       Musculoskeletal: Strength & Muscle Tone: within normal limits Gait & Station: normal Patient leans:   Psychiatric Specialty Exam: Physical Exam  Nursing note and vitals reviewed.   ROS  Blood pressure 138/62, pulse 72, temperature 98.1 F (36.7 C), temperature source Oral, resp.  rate 17, height _0  (1.549 m), weight 80.3 kg (177 lb 0.5 oz), SpO2 100 %.Body mass index is 33.45 kg/m.  General Appearance: Casual  Eye Contact:  fair  Speech:  Slow  Volume:  Decreased  Mood:  Depressed and Dysphoric  Affect:  restricted  Thought Process:  Goal Directed  Orientation:  Full (Time, Place, and Person)  Thought Content:  Logical  Suicidal Thoughts:   denies at this time  Homicidal Thoughts:  No  Memory:  Immediate;   Good Recent;   Fair Remote;   Fair  Judgement:  Impaired  Insight:  Shallow  Psychomotor Activity:  Normal  Concentration:  Concentration: Fair  Recall:  [place] of Knowledge:  Good  Language:  Fair  Akathisia:  No  Handed:  Right  AIMS (if indicated):     Assets:  Desire for Improvement Housing Resilience Social Support  ADL's:  Intact  Cognition:  WNL  Sleep:          Treatment Plan Summary: Daily contact with patient to assess and evaluate symptoms and progress in treatment and Medication management   84 year old woman status post suicide attempt with OD on diabetes pills.   Pt is depressed.  Continue IVC.    citalopram just started 10 mg/day, cont it.    Observation Level/Precautions:  15 minute checks  Laboratory:  CBC Chemistry Profile UDS UA, TSH, EKG  Psychotherapy:    Medications:    Consultations:    Discharge Concerns:    Estimated LOS:  Other:     Physician Treatment Plan for Primary Diagnosis: <principal problem not specified> Long Term Goal(s): Improvement in symptoms so as ready for discharge  Short Term Goals: Ability to identify changes in lifestyle to reduce recurrence of condition will improve, Ability to verbalize feelings will improve, Ability to disclose and discuss suicidal ideas, Ability to demonstrate self-control will improve, Ability to identify and develop effective coping behaviors will improve, Ability to maintain clinical measurements within normal limits will improve, Compliance with prescribed medications will improve and Ability to identify triggers associated with substance abuse/mental health issues will improve  Physician Treatment Plan for Secondary Diagnosis: Active Problems:   Severe major depression, single episode, without psychotic features ([place])  Long Term Goal(s): Improvement in symptoms so as ready for discharge  Short Term Goals: Ability to  identify changes in lifestyle to reduce recurrence of condition will improve, Ability to verbalize feelings will improve, Ability to disclose and discuss suicidal ideas, Ability to demonstrate self-control will improve, Ability to identify and develop effective coping behaviors will improve, Ability to maintain clinical measurements within normal limits will improve, Compliance with prescribed medications will improve and Ability to identify triggers associated with substance abuse/mental health issues will improve  I certify that inpatient services furnished can reasonably be expected to improve the patient's condition.    Lenward Chancellor, MD 3/17/20191:40 PM

## 2017-11-05 NOTE — BHH Suicide Risk Assessment (Signed)
[place] INPATIENT:  Family/Significant Other Suicide Prevention Education  Suicide Prevention Education:  Patient Refusal for Family/Significant Other Suicide Prevention Education: The patient Lisa Campbell has refused to provide written consent for family/significant other to be provided Family/Significant Other Suicide Prevention Education during admission and/or prior to discharge.  Physician notified.  Gaylin Osoria  CUEBAS-COLON 11/05/2017, 3:31 PM

## 2017-11-05 NOTE — BHH Group Notes (Signed)
LCSW Group Therapy Note 11/05/2017 1:15pm  Type of Therapy and Topic: Group Therapy: Feelings Around Returning Home & Establishing a Supportive Framework and Supporting Oneself When Supports Not Available  Participation Level: Active  Description of Group:  Patients first processed thoughts and feelings about upcoming discharge. These included fears of upcoming changes, lack of change, new living environments, judgements and expectations from others and overall stigma of mental health issues. The group then discussed the definition of a supportive framework, what that looks and feels like, and how do to discern it from an unhealthy non-supportive network. The group identified different types of supports as well as what to do when your family/friends are less than helpful or unavailable  Therapeutic Goals  1. Patient will identify one healthy supportive network that they can use at discharge. 2. Patient will identify one factor of a supportive framework and how to tell it from an unhealthy network. 3. Patient able to identify one coping skill to use when they do not have positive supports from others. 4. Patient will demonstrate ability to communicate their needs through discussion and/or role plays.  Summary of Patient Progress:  Pt engaged during group session. As patients processed their anxiety about discharge and described healthy supports patient shared that she feels good and that she is ready to go home because she has been misdiagnosed. Pt reported that she feels homesick not depressed, she recently moved from [place] and she misses home.  Patients identified at least one self-care tool they were willing to use after discharge such as doing puzzles.   Therapeutic Modalities Cognitive Behavioral Therapy Motivational Interviewing   Jadie Allington  CUEBAS-COLON, LCSW 11/05/2017 12:16 PM

## 2017-11-05 NOTE — [facility] Counselor (Signed)
Adult Comprehensive Assessment  Patient ID: Lisa Campbell, female   DOB: 04-29-1928, 84 y.o.   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 / Lack of housing: resides with sister until she find her apartment Physical health (include injuries & life threatening diseases): diabetes Social relationships: good Substance abuse: pt denies substance abuse Bereavement / Loss: father, mother, brother, cousin -- many years ago  Living/Environment/Situation:  Living Arrangements: Other relatives(with sister) How long has patient lived in current situation?: 2 months What is atmosphere in current home: Comfortable, Temporary  Family History:  Marital status: Single Are you sexually active?: No What is your sexual orientation?: heterosexual Has your sexual activity been affected by drugs, alcohol, medication, or emotional stress?: none reported  Does patient have children?: No  Childhood History:  By whom was/is the patient raised?: Both parents Description of patient's relationship with caregiver when they were a child: good Patient's description of current relationship with people who raised him/her: parents are deceased How were you disciplined when you got in trouble as a child/adolescent?: spanking Does patient have siblings?: Yes Number of Siblings: 4 Description of patient's current relationship with siblings: 2 sisters, 1 brother, 1 cousin (mom raised her like her sister) - good relationship Did patient suffer any verbal/emotional/physical/sexual abuse as a child?: No Did patient suffer from severe childhood neglect?: No Has patient ever been sexually abused/assaulted/raped as an adolescent or adult?: No Was the patient ever a victim of a crime or a  disaster?: No Witnessed domestic violence?: No Has patient been effected by domestic violence as an adult?: No  Education:  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 Currently a student?: No Learning disability?: No  Employment/Work Situation:   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 been impacted by current illness: No What is the longest time patient has a held a job?: 20 years Where was the patient employed at that time?: [employer] Has patient ever been in the [employer]?: No Has patient ever served in combat?: No Did You Receive Any Psychiatric Treatment/Services While in Passenger transport manager?: No Are There Guns or Other Weapons in [place]?: No  Financial Resources:   Financial resources: [employer], Marine scientist [employer] Does patient have a Programmer, applications or guardian?: No  Alcohol/Substance Abuse:   What has been your use of drugs/alcohol within the last 12 months?: Pt denies substance abuse  If attempted suicide, did drugs/alcohol play a role in this?: No Alcohol/Substance Abuse Treatment Hx: Denies past history Has alcohol/substance abuse ever caused legal problems?: No  Social Support System:   Pensions consultant Support System: Fair Astronomer System: sister Type of faith/religion: Protestant How does patient's faith help to cope with current illness?: n/a  Leisure/Recreation:   Leisure and Hobbies: sewing, Occupational hygienist, geneology  Strengths/Needs:   What things does the patient do well?: pt stated "everything I do, I do it well" In what areas does patient struggle / problems for patient: pt stated "nothing"  Discharge Plan:   Does patient have access to transportation?: Yes(sister will pick her up) Will patient be returning to same living situation after discharge?: Yes(with sister until she gets her own apartment) Currently receiving community mental health services: No If no, would patient like referral for  services when discharged?: Yes (What county?)([place]) Does patient have financial barriers related  to discharge medications?: No  Summary/Recommendations:   Summary and Recommendations (to be completed by the evaluator): Patient is an 84 year old female with no prior psychiatric history admitted due to a suicide attempt. Patient moved in with her sister in January from [place] and reported feeling homesick. Patient reports experiencing symptoms of depression. Patient will benefit from crisis stabilization, medication evaluation, group therapy and psychoeducation. In addition to case management for discharge planning. At discharge it is recommended that patient adhere to the established discharge plan and continue treatment.   Lisa Campbell  CUEBAS-COLON. 11/05/2017

## 2017-11-05 NOTE — Plan of Care (Addendum)
Patient is alert and oriented X 4, currently denies SI, HI and AVH. Patient states the reason for her admission is due to being "Homesick." Patient recently moved to [facility] from [place]. Patient states, "I took to many of my medications." Patient states she occasionally has trouble sleeping at night, denies abuse of any kind, non tobacco user and has had a pneumonia and flu vaccination within the last year. Patient states she has had zero falls in the past six months. Patient has logical thinking and is compliant with treatment thus far; affect is pleasant but isolative. Skin assessment preformed with help of Gwen, Therapist, sports. Patient has a hematoma upper left arm where past IV was placed and solar lentigo scattered on arms and trunk of body. There was no contraband found. Nurse will continue to monitor and encourage participation with peers in group therapies.

## 2017-11-05 NOTE — Progress Notes (Signed)
MD notified. Orders for IVC must be discontinued prior to discharge to beh med unit. MD notified. Order received. I will continue to assess.

## 2017-11-05 NOTE — Discharge Summary (Signed)
[facility] NAME: Lisa Campbell    MR#:  [id number]  DATE OF BIRTH:  1928/02/19  DATE OF ADMISSION:  10/31/2017 ADMITTING PHYSICIAN: Saundra Shelling, MD  DATE OF DISCHARGE: 11/05/2017  PRIMARY CARE PHYSICIAN: McLean-Scocuzza, Nino Glow, MD    ADMISSION DIAGNOSIS:  Lactic acidosis [E87.2] Acute encephalopathy [G93.40] Ingestion of unknown drug, intentional self-harm, initial encounter ([place]) [T50.902A]  DISCHARGE DIAGNOSIS:  Principal Problem:   Severe major depression, single episode, without psychotic features ([place]) Active Problems:   Altered mental status   Suicide attempt ([place])   SECONDARY DIAGNOSIS:   Past Medical History:  Diagnosis Date  . Arthritis   . Cancer ([place])    skin mohs left ear ? 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 Colon polyps   . Gastric ulcer   . GERD (gastroesophageal reflux disease)   . Hyperlipidemia   . Hypertension   . Rotator cuff disorder    right  . Shingles     HOSPITAL COURSE:   1. Lactic acidosis secondary to metformin overdose.  Improved with IV fluids.   2. Acute kidney injury.  Improved with IV fluids.  Now off fluids. 3. Depression with suicide attempt.  Patient took multiple pills of metformin and Januvia.  Patient seen by psychiatry and they have the patient on one-to-one sitter.  Patient on low-dose Celexa. Discharge to psych floor today 4. Essential hypertension blood pressure stable off medications at this point. If blood pressure trends up can consider restarting Hyzaar 100mg  po daily.   5. Type 2 diabetes. All sugars have been good off medications.  Continue to check fingersticks qac and qhs.  If sugars go above 200, can consider restarting januvia 50mg  po daily.    DISCHARGE CONDITIONS:   satisfactory  CONSULTS OBTAINED:  Treatment Team:  Clapacs, Madie Reno, MD Lavonia Dana, MD  DRUG ALLERGIES:   Allergies  Allergen Reactions  . Penicillins      ?rxn   . Statins     ? Rxn cant tolerate crestor and lipitor noted prev records.     DISCHARGE MEDICATIONS:   Allergies as of 11/05/2017      Reactions   Penicillins    ?rxn    Statins    ? Rxn cant tolerate crestor and lipitor noted prev records.       Medication List    STOP taking these medications   losartan-hydrochlorothiazide 100-25 MG tablet Commonly known as:  HYZAAR   metFORMIN 500 MG tablet Commonly known as:  GLUCOPHAGE   omeprazole 20 MG capsule Commonly known as:  PRILOSEC   sitaGLIPtin 50 MG tablet Commonly known as:  JANUVIA     TAKE these medications   citalopram 10 MG tablet Commonly known as:  CELEXA Take 1 tablet (10 mg total) by mouth daily.   Vitamin D3 2000 units Tabs Take 2,000 Units by mouth daily.        DISCHARGE INSTRUCTIONS:   Follow up on psych floor one day  If you experience worsening of your admission symptoms, develop shortness of breath, life threatening emergency, suicidal or homicidal thoughts you must seek medical attention immediately by calling 911 or calling your MD immediately  if symptoms less severe.  You Must read complete instructions/literature along with all the possible adverse reactions/side effects for all the Medicines you take and that have been prescribed to you. Take any new Medicines after you have completely understood and  accept all the possible adverse reactions/side effects.   Please note  You were cared for by a hospitalist during your hospital stay. If you have any questions about your discharge medications or the care you received while you were in the hospital after you are discharged, you can call the unit and asked to speak with the hospitalist on call if the hospitalist that took care of you is not available. Once you are discharged, your primary care physician will handle any further medical issues. Please note that NO REFILLS for any discharge medications will be authorized once you are discharged,  as it is imperative that you return to your primary care physician (or establish a relationship with a primary care physician if you do not have one) for your aftercare needs so that they can reassess your need for medications and monitor your lab values.    Today   CHIEF COMPLAINT:   Chief Complaint  Patient presents with  . Drug Overdose    HISTORY OF PRESENT ILLNESS:  Lisa Campbell  is a 84 y.o. female came in with overdose on metformin and [place]   VITAL SIGNS:  Blood pressure (!) 131/57, pulse 66, temperature 98.2 F (36.8 C), temperature source Oral, resp. rate 18, height 5\' 2"  (1.575 m), weight 80.3 kg (177 lb), SpO2 99 %.   PHYSICAL EXAMINATION:  GENERAL:  84 y.o.-year-old patient lying in the bed with no acute distress.  EYES: Pupils equal, round, reactive to light and accommodation. No scleral icterus. Extraocular muscles intact.  HEENT: Head atraumatic, normocephalic. Oropharynx and nasopharynx clear.  NECK:  Supple, no jugular venous distention. No thyroid enlargement, no tenderness.  LUNGS: Normal breath sounds bilaterally, no wheezing, rales,rhonchi or crepitation. No use of accessory muscles of respiration.  CARDIOVASCULAR: S1, S2 normal. No murmurs, rubs, or gallops.  ABDOMEN: Soft, non-tender, non-distended. Bowel sounds present. No organomegaly or mass.  EXTREMITIES: No pedal edema, cyanosis, or clubbing.  NEUROLOGIC: Cranial nerves II through XII are intact. Muscle strength 5/5 in all extremities. Sensation intact. Gait not checked.  PSYCHIATRIC: The patient is alert and oriented x 3.  SKIN: No obvious rash, lesion, or ulcer.   DATA REVIEW:   CBC Recent Labs  Lab 11/01/17 0556  WBC 13.4*  HGB 12.3  HCT 37.9  PLT 296    Chemistries  Recent Labs  Lab 10/31/17 1440 11/01/17 0556  11/04/17 0647  NA 139 142   < > 141  K 3.9 5.3*   < > 3.6  CL 105 106   < > 104  CO2 20* 14*   < > 28  GLUCOSE 167* 134*   < > 130*  BUN 16 21*   < > 22*   CREATININE 1.36* 1.70*   < > 1.34*  CALCIUM 8.8* 8.3*   < > 8.5*  MG 1.2* 2.2  --   --   AST 30  --   --   --   ALT 13*  --   --   --   ALKPHOS 59  --   --   --   BILITOT 1.9*  --   --   --    < > = values in this interval not displayed.      Management plans discussed with the patient, and she is in agreement.  CODE STATUS:     Code Status Orders  (From admission, onward)        Start     Ordered   10/31/17 1749  Do not attempt resuscitation (DNR)  Continuous    Question Answer Comment  In the event of cardiac or respiratory ARREST Do not call a "code blue"   In the event of cardiac or respiratory ARREST Do not perform Intubation, CPR, defibrillation or ACLS   In the event of cardiac or respiratory ARREST Use medication by any route, position, wound care, and other measures to relive pain and suffering. May use oxygen, suction and manual treatment of airway obstruction as needed for comfort.      10/31/17 1748    Code Status History    Date Active Date Inactive Code Status Order ID Comments User Context   10/31/2017 16:10 10/31/2017 17:48 DNR [id number]  Merlyn Lot, MD ED    Advance Directive Documentation     Most Recent Value  Type of Advance Directive  Out of facility DNR (pink MOST or yellow form)  Pre-existing out of facility DNR order (yellow form or pink MOST form)  No data  "MOST" Form in Place?  No data      TOTAL TIME TAKING CARE OF THIS PATIENT: 32 minutes.    Loletha Grayer M.D on 11/05/2017 at 8:31 AM  Between 7am to 6pm - Pager - [phone]  After 6pm go to [email or web address] - password [email or web address] [facility]  [phone]  CC: Primary care physician; McLean-Scocuzza, Nino Glow, MD

## 2017-11-05 NOTE — Tx Team (Signed)
Initial Treatment Plan 11/05/2017 11:45 AM Lisa Campbell KBT:[id number]    PATIENT STRESSORS: Medication change or noncompliance Other: Patient just moved to [place] from [place]: Ability for insight Active sense of humor Capable of independent living Communication skills   PATIENT IDENTIFIED PROBLEMS: Develop new coping skills to handle stress  Loneliness   Adjustment to New living arrangment                 DISCHARGE CRITERIA:  Ability to meet basic life and health needs Improved stabilization in mood, thinking, and/or behavior Medical problems require only outpatient monitoring  PRELIMINARY DISCHARGE PLAN: Attend PHP/IOP Return to previous living arrangement  PATIENT/FAMILY INVOLVEMENT: This treatment plan has been presented to and reviewed with the patient, Lisa Campbell, and/or family member.  The patient and family have been given the opportunity to ask questions and make suggestions.  Geraldo Docker, RN 11/05/2017, 11:45 AM

## 2017-11-06 LAB — LIPID PANEL
Cholesterol: 239 mg/dL — ABNORMAL HIGH (ref 0–200)
HDL: 57 mg/dL (ref >40)
LDL CALC: 138 mg/dL — AB (ref 0–99)
TRIGLYCERIDES: 221 mg/dL — AB (ref <150)
Total CHOL/HDL Ratio: 4.2 RATIO
VLDL: 44 mg/dL — ABNORMAL HIGH (ref 0–40)

## 2017-11-06 LAB — GLUCOSE, CAPILLARY
GLUCOSE-CAPILLARY: 106 mg/dL — AB (ref 65–99)
GLUCOSE-CAPILLARY: 157 mg/dL — AB (ref 65–99)
Glucose-Capillary: 133 mg/dL — ABNORMAL HIGH (ref 65–99)
Glucose-Capillary: 132 mg/dL — ABNORMAL HIGH (ref 65–99)

## 2017-11-06 LAB — TSH: TSH: 3.808 u[IU]/mL (ref 0.350–4.500)

## 2017-11-06 LAB — HEMOGLOBIN A1C
HEMOGLOBIN A1C: 5.4 % (ref 4.8–5.6)
MEAN PLASMA GLUCOSE: 108.28 mg/dL

## 2017-11-06 MED ORDER — CITALOPRAM HYDROBROMIDE 10 MG PO TABS: 10.0000 mg | ORAL_TABLET | Freq: Every day | ORAL | 1 refills | Status: DC

## 2017-11-06 NOTE — Progress Notes (Signed)
[facility] MD Progress Note  11/06/2017 8:39 PM Lisa Campbell  MRN:  [id number]  Subjective:   Lisa Campbell met with treatment team today. She reports feeling better and denies suicidal ideation. Apparently, she has been refusing antidepressant on the medical floor and only took it last nifht. The patient is not forthcoming with any information. She only stated that "it was a mistake".   Spoke with her sister who is very concerned and feels the patient needs "a lot of therapy and medications" and that she may not return home until she is no longer depressed. The sister is also concerned that after suicide attempt, the patient may not be accepted to the [facility] facility. Also, the sister does not drive well and requests that follow up appointments are made locally.  Treatment plan. We will continue Lexapro.  Social/disposition. We will contact [facility] facility to see if the patient is still on their wait list. Family meeting before discharge as her sister, who is also elderly, is unsure if she can handle the situation.  Principal Problem: Severe major depression, single episode, without psychotic features ([place]) Diagnosis:   Patient Active Problem List   Diagnosis Date Noted  . Severe major depression, single episode, without psychotic features ([place]) [F32.2] 10/31/2017    Priority: High  . Altered mental status [R41.82] 10/31/2017  . Suicide attempt ([place]) [T14.91XA] 10/31/2017  . HTN (hypertension) [I10] 09/30/2017  . GERD (gastroesophageal reflux disease) [K21.9] 09/30/2017  . Gout [M10.9] 09/30/2017  . Controlled type 2 diabetes mellitus without complication, without long-term current use of insulin ([place]) [E11.9] 09/27/2017  . Vitamin D deficiency [E55.9] 09/27/2017  . History of skin cancer [Z85.828] 09/27/2017   Total Time spent with patient: 30 minutes  Past Psychiatric History: none  Past Medical History:  Past Medical History:  Diagnosis Date  .  Arthritis   . Cancer ([place])    skin mohs left ear ? 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 Colon polyps   . Gastric ulcer   . GERD (gastroesophageal reflux disease)   . Hyperlipidemia   . Hypertension   . Rotator cuff disorder    right  . Shingles     Past Surgical History:  Procedure Laterality Date  . KNEE ARTHROSCOPY     b/l    Family History:  Family History  Problem Relation Age of Onset  . Cancer Mother        ?colon   . Diabetes Mother   . Early death Mother   . Heart disease Mother   . Hypertension Mother   . Hyperlipidemia Mother   . Cancer Father        ? MM  . Diabetes Sister   . Diabetes Brother   . Arthritis Brother    Family Psychiatric  History: none Social History:  Social History   Substance and Sexual Activity  Alcohol Use Yes     Social History   Substance and Sexual Activity  Drug Use No    Social History   Socioeconomic History  . Marital status: Single    Spouse name: None  . Number of children: None  . Years of education: None  . Highest education level: None  Social Needs  . Financial resource strain: None  . Food insecurity - worry: None  . Food insecurity - inability: None  . Transportation needs - medical: None  . Transportation needs - non-medical: None  Occupational History  .  Occupation: retired  Tobacco Use  . Smoking status: Never Smoker  . Smokeless tobacco: Never Used  Substance and Sexual Activity  . Alcohol use: Yes  . Drug use: No  . Sexual activity: No  Other Topics Concern  . None  Social History Narrative   Retired former Lawyer, assoc. Degree    Moved from [place]    Wears seat belts, no guns at home    No exercise    Additional Social History:  Specify valuables returned: clothing                      Sleep: Poor  Appetite:  Poor  Current Medications: Current Facility-Administered Medications  Medication Dose Route Frequency Provider Last Rate  Last Dose  . acetaminophen (TYLENOL) tablet 650 mg  650 mg Oral Q6H PRN Clapacs, John T, MD      . alum & mag hydroxide-simeth (MAALOX/MYLANTA) 200-200-20 MG/5ML suspension 30 mL  30 mL Oral Q4H PRN Clapacs, John T, MD      . citalopram (CELEXA) tablet 10 mg  10 mg Oral Daily Clapacs, Madie Reno, MD   10 mg at 11/06/17 0831  . magnesium hydroxide (MILK OF MAGNESIA) suspension 30 mL  30 mL Oral Daily PRN Clapacs, Madie Reno, MD      . senna-docusate (Senokot-S) tablet 1 tablet  1 tablet Oral QHS PRN Clapacs, Madie Reno, MD      . traZODone (DESYREL) tablet 50 mg  50 mg Oral QHS PRN Clapacs, Madie Reno, MD        Lab Results:  Results for orders placed or performed during the hospital encounter of 11/05/17 (from the past 48 hour(s))  Basic metabolic panel     Status: Abnormal   Collection Time: 11/05/17 11:44 AM  Result Value Ref Range   Sodium 140 135 - 145 mmol/L   Potassium 4.8 3.5 - 5.1 mmol/L   Chloride 106 101 - 111 mmol/L   CO2 25 22 - 32 mmol/L   Glucose, Bld 162 (H) 65 - 99 mg/dL   BUN 22 (H) 6 - 20 mg/dL   Creatinine, Ser 1.20 (H) 0.44 - 1.00 mg/dL   Calcium 8.8 (L) 8.9 - 10.3 mg/dL   GFR calc non Af Amer 39 (L) >60 mL/min   GFR calc Af Amer 46 (L) >60 mL/min    Comment: (NOTE) The eGFR has been calculated using the CKD EPI equation. This calculation has not been validated in all clinical situations. eGFR's persistently <60 mL/min signify possible Chronic Kidney Disease.    Anion gap 9 5 - 15    Comment: Performed at [facility], [address] [place], [place] [address]  Glucose, capillary     Status: Abnormal   Collection Time: 11/05/17  7:25 PM  Result Value Ref Range   Glucose-Capillary 135 (H) 65 - 99 mg/dL  Hemoglobin A1c     Status: None   Collection Time: 11/06/17  6:44 AM  Result Value Ref Range   Hgb A1c MFr Bld 5.4 4.8 - 5.6 %    Comment: (NOTE) Pre diabetes:          5.7%-6.4% Diabetes:              >6.4% Glycemic control for   <7.0% adults with diabetes     Mean Plasma Glucose 108.28 mg/dL    Comment: Performed at [facility], [place] [address], [place], [place] [address]  Lipid panel  Status: Abnormal   Collection Time: 11/06/17  6:44 AM  Result Value Ref Range   Cholesterol 239 (H) 0 - 200 mg/dL   Triglycerides 221 (H) <150 mg/dL   HDL 57 >40 mg/dL   Total CHOL/HDL Ratio 4.2 RATIO   VLDL 44 (H) 0 - 40 mg/dL   LDL Cholesterol 138 (H) 0 - 99 mg/dL    Comment:        Total Cholesterol/HDL:CHD Risk Coronary Heart Disease Risk Table                     Men   Women  1/2 Average Risk   3.4   3.3  Average Risk       5.0   4.4  2 X Average Risk   9.6   7.1  3 X Average Risk  23.4   11.0        Use the calculated Patient Ratio above and the CHD Risk Table to determine the patient's CHD Risk.        ATP III CLASSIFICATION (LDL):  <100     mg/dL   Optimal  100-129  mg/dL   Near or Above                    Optimal  130-159  mg/dL   Borderline  160-189  mg/dL   High  >190     mg/dL   Very High Performed at [facility], [address] [place], [place] [address]   TSH     Status: None   Collection Time: 11/06/17  6:44 AM  Result Value Ref Range   TSH 3.808 0.350 - 4.500 uIU/mL    Comment: Performed by a 3rd Generation assay with a functional sensitivity of <=0.01 uIU/mL. Performed at [facility], [address] [place], [place] [address]   Glucose, capillary     Status: Abnormal   Collection Time: 11/06/17  7:04 AM  Result Value Ref Range   Glucose-Capillary 133 (H) 65 - 99 mg/dL   Comment 1 Notify RN   Glucose, capillary     Status: Abnormal   Collection Time: 11/06/17 11:48 AM  Result Value Ref Range   Glucose-Capillary 106 (H) 65 - 99 mg/dL  Glucose, capillary     Status: Abnormal   Collection Time: 11/06/17  5:48 PM  Result Value Ref Range   Glucose-Capillary 157 (H) 65 - 99 mg/dL    Blood Alcohol level:  No results found for: [facility]  Metabolic Disorder Labs: Lab Results  Component Value  Date   HGBA1C 5.4 11/06/2017   MPG 108.28 11/06/2017   No results found for: PROLACTIN Lab Results  Component Value Date   CHOL 239 (H) 11/06/2017   TRIG 221 (H) 11/06/2017   HDL 57 11/06/2017   CHOLHDL 4.2 11/06/2017   VLDL 44 (H) 11/06/2017   LDLCALC 138 (H) 11/06/2017   LDLCALC 157 (H) 09/27/2017    Physical Findings: AIMS: Facial and Oral Movements Muscles of Facial Expression: None, normal Lips and Perioral Area: None, normal Jaw: None, normal Tongue: None, normal,Extremity Movements Upper (arms, wrists, hands, fingers): None, normal Lower (legs, knees, ankles, toes): None, normal, Trunk Movements Neck, shoulders, hips: None, normal, Overall Severity Severity of abnormal movements (highest score from questions above): None, normal Incapacitation due to abnormal movements: None, normal Patient's awareness of abnormal movements (rate only patient's report): No Awareness, Dental Status Current problems with teeth and/or dentures?: No Does patient usually wear  dentures?: No  CIWA:    COWS:     Musculoskeletal: Strength & Muscle Tone: within normal limits Gait & Station: normal Patient leans: N/A  Psychiatric Specialty Exam: Physical Exam  Nursing note reviewed. Psychiatric: She has a normal mood and affect. Her speech is normal and behavior is normal. Thought content normal. Cognition and memory are normal. She expresses impulsivity.    Review of Systems  Neurological: Negative.   Psychiatric/Behavioral: Negative.   All other systems reviewed and are negative.   Blood pressure 129/78, pulse 75, temperature 97.7 F (36.5 C), temperature source Oral, resp. rate 16, height 5' 1"  (1.549 m), weight 80.3 kg (177 lb 0.5 oz), SpO2 100 %.Body mass index is 33.45 kg/m.  General Appearance: Casual  Eye Contact:  Good  Speech:  Clear and Coherent  Volume:  Normal  Mood:  Euthymic  Affect:  Flat  Thought Process:  Goal Directed and Descriptions of Associations: Intact   Orientation:  Full (Time, Place, and Person)  Thought Content:  WDL  Suicidal Thoughts:  No  Homicidal Thoughts:  No  Memory:  Immediate;   Fair Recent;   Fair Remote;   Fair  Judgement:  Impaired  Insight:  Shallow  Psychomotor Activity:  Normal  Concentration:  Concentration: Fair and Attention Span: Fair  Recall:  [employer] of Knowledge:  Fair  Language:  Fair  Akathisia:  No  Handed:  Right  AIMS (if indicated):     Assets:  Communication Skills Desire for Improvement Financial Resources/Insurance Physical Health Resilience Social Support  ADL's:  Intact  Cognition:  WNL  Sleep:  Number of Hours: 8.15     Treatment Plan Summary: Daily contact with patient to assess and evaluate symptoms and progress in treatment and Medication management   Lisa Campbell is an 84 year old female with no past psychiatric history transferred from medical floor where she was hospitalized after suicide attempt by medication overdose. The patient who used to live independently relocated to [facility] in January, lives with her sister while awaiting an opening at the [facility] [place]. She is home sick. Suicidal ideation has resolved. The patient knows it was "a mistake". She is able to contract for safety. She is forward thinking and optimistic about the future.  -patient is able to contract for safety in the hospital  #Mood Continue Lexapro 10 mg daily  #Diabetes -BS are normal  #Disposition -she was discharged to home with her sister -follow up with [facility]    Orson Slick, MD 11/06/2017, 8:39 PM

## 2017-11-06 NOTE — Tx Team (Signed)
Interdisciplinary Treatment and Diagnostic Plan Update  11/06/2017 Time of Session: [place] MRN: [id number]  Principal Diagnosis: Severe major depression, single episode, without psychotic features ([place])  Secondary Diagnoses: Principal Problem:   Severe major depression, single episode, without psychotic features ([place]) Active Problems:   Controlled type 2 diabetes mellitus without complication, without long-term current use of insulin ([place])   Suicide attempt ([place])   Current Medications:  Current Facility-Administered Medications  Medication Dose Route Frequency Provider Last Rate Last Dose  . acetaminophen (TYLENOL) tablet 650 mg  650 mg Oral Q6H PRN Clapacs, John T, MD      . alum & mag hydroxide-simeth (MAALOX/MYLANTA) 200-200-20 MG/5ML suspension 30 mL  30 mL Oral Q4H PRN Clapacs, John T, MD      . citalopram (CELEXA) tablet 10 mg  10 mg Oral Daily Clapacs, Madie Reno, MD   10 mg at 11/06/17 0831  . magnesium hydroxide (MILK OF MAGNESIA) suspension 30 mL  30 mL Oral Daily PRN Clapacs, John T, MD      . senna-docusate (Senokot-S) tablet 1 tablet  1 tablet Oral QHS PRN Clapacs, John T, MD      . traZODone (DESYREL) tablet 50 mg  50 mg Oral QHS PRN Clapacs, Madie Reno, MD       PTA Medications: Medications Prior to Admission  Medication Sig Dispense Refill Last Dose  . Cholecalciferol (VITAMIN D3) 2000 units TABS Take 2,000 Units by mouth daily.    N/A at N/A  . citalopram (CELEXA) 10 MG tablet Take 1 tablet (10 mg total) by mouth daily.       Patient Stressors: Medication change or noncompliance Other: Patient just moved to [place] from [place]  Patient Strengths: Ability for insight Active sense of humor Capable of independent living Communication skills  Treatment Modalities: Medication Management, Group therapy, Case management,  1 to 1 session with clinician, Psychoeducation, Recreational therapy.   Physician Treatment Plan for Primary Diagnosis: Severe major  depression, single episode, without psychotic features ([place]) Long Term Goal(s): Improvement in symptoms so as ready for discharge Improvement in symptoms so as ready for discharge   Short Term Goals: Ability to identify changes in lifestyle to reduce recurrence of condition will improve Ability to verbalize feelings will improve Ability to disclose and discuss suicidal ideas Ability to demonstrate self-control will improve Ability to identify and develop effective coping behaviors will improve Ability to maintain clinical measurements within normal limits will improve Compliance with prescribed medications will improve Ability to identify triggers associated with substance abuse/mental health issues will improve Ability to identify changes in lifestyle to reduce recurrence of condition will improve Ability to verbalize feelings will improve Ability to disclose and discuss suicidal ideas Ability to demonstrate self-control will improve Ability to identify and develop effective coping behaviors will improve Ability to maintain clinical measurements within normal limits will improve Compliance with prescribed medications will improve Ability to identify triggers associated with substance abuse/mental health issues will improve  Medication Management: Evaluate patient's response, side effects, and tolerance of medication regimen.  Therapeutic Interventions: 1 to 1 sessions, Unit Group sessions and Medication administration.  Evaluation of Outcomes: Progressing  Physician Treatment Plan for Secondary Diagnosis: Principal Problem:   Severe major depression, single episode, without psychotic features ([place]) Active Problems:   Controlled type 2 diabetes mellitus without complication, without long-term current use of insulin ([place])   Suicide attempt [facility])  Long Term Goal(s): Improvement in symptoms so as ready for discharge Improvement in symptoms so  as ready for discharge   Short Term  Goals: Ability to identify changes in lifestyle to reduce recurrence of condition will improve Ability to verbalize feelings will improve Ability to disclose and discuss suicidal ideas Ability to demonstrate self-control will improve Ability to identify and develop effective coping behaviors will improve Ability to maintain clinical measurements within normal limits will improve Compliance with prescribed medications will improve Ability to identify triggers associated with substance abuse/mental health issues will improve Ability to identify changes in lifestyle to reduce recurrence of condition will improve Ability to verbalize feelings will improve Ability to disclose and discuss suicidal ideas Ability to demonstrate self-control will improve Ability to identify and develop effective coping behaviors will improve Ability to maintain clinical measurements within normal limits will improve Compliance with prescribed medications will improve Ability to identify triggers associated with substance abuse/mental health issues will improve     Medication Management: Evaluate patient's response, side effects, and tolerance of medication regimen.  Therapeutic Interventions: 1 to 1 sessions, Unit Group sessions and Medication administration.  Evaluation of Outcomes: Progressing   RN Treatment Plan for Primary Diagnosis: Severe major depression, single episode, without psychotic features ([place]) Long Term Goal(s): Knowledge of disease and therapeutic regimen to maintain health will improve  Short Term Goals: Ability to verbalize feelings will improve, Ability to identify and develop effective coping behaviors will improve and Compliance with prescribed medications will improve  Medication Management: RN will administer medications as ordered by provider, will assess and evaluate patient's response and provide education to patient for prescribed medication. RN will report any adverse and/or side  effects to prescribing provider.  Therapeutic Interventions: 1 on 1 counseling sessions, Psychoeducation, Medication administration, Evaluate responses to treatment, Monitor vital signs and CBGs as ordered, Perform/monitor CIWA, COWS, AIMS and Fall Risk screenings as ordered, Perform wound care treatments as ordered.  Evaluation of Outcomes: Progressing   LCSW Treatment Plan for Primary Diagnosis: Severe major depression, single episode, without psychotic features ([place]) Long Term Goal(s): Safe transition to appropriate next level of care at discharge, Engage patient in therapeutic group addressing interpersonal concerns.  Short Term Goals: Engage patient in aftercare planning with referrals and resources, Increase emotional regulation and Increase skills for wellness and recovery  Therapeutic Interventions: Assess for all discharge needs, 1 to 1 time with Social worker, Explore available resources and support systems, Assess for adequacy in community support network, Educate family and significant other(s) on suicide prevention, Complete Psychosocial Assessment, Interpersonal group therapy.  Evaluation of Outcomes: Progressing   Progress in Treatment: Attending groups: No. Participating in groups: No. Taking medication as prescribed: Yes. Toleration medication: Yes. Family/Significant other contact made: No, will contact:  a support if pt provides consent. Patient understands diagnosis: Yes. Discussing patient identified problems/goals with staff: Yes. Medical problems stabilized or resolved: Yes. Denies suicidal/homicidal ideation: Yes. Issues/concerns per patient self-inventory: No. Other: None at this time.   New problem(s) identified: No, Describe:  none at this time.  New Short Term/Long Term Goal(s): Pt reported her goal for treatment is to, "go home. That'd be the best thing you can do for me."   Discharge Plan or Barriers: Pt will discharge home and will continue treatment  in the outpatient setting.   Reason for Continuation of Hospitalization: Depression Medication stabilization  Estimated Length of Stay: 1-2 days  Attendees: Patient: Lisa Campbell 11/06/2017 11:01 AM  Physician: Dr. Bary Leriche 11/06/2017 11:01 AM  Nursing:  11/06/2017 11:01 AM  RN Care Manager: 11/06/2017 11:01 AM  Social Worker:  Alden Hipp, LCSW 11/06/2017 11:01 AM  Recreational Therapist:  11/06/2017 11:01 AM  Other: Darin Engels, LCSWA 11/06/2017 11:01 AM  Other: Dossie Arbour, LCSW 11/06/2017 11:01 AM  Other: 11/06/2017 11:01 AM    Scribe for Treatment Team: Alden Hipp, LCSW 11/06/2017 11:01 AM

## 2017-11-06 NOTE — Plan of Care (Signed)
Patient is alert and oriented X 4. Patient denies SI, HI and AVH. Patient is pleasant  and cooperative with treatment. Patient attends group meetings and is compliant with medication. Patient spends most of the time in her room working on word search magazines. Patient states she will check her blood sugar at home every 3 to 4 days. Patient has no complaints for today. Nurse will continue to monitor. Activity: Interest or engagement in leisure activities will improve 11/06/2017 1010 - Progressing by Geraldo Docker, RN Imbalance in normal sleep/wake cycle will improve 11/06/2017 1010 - Progressing by Geraldo Docker, RN   Coping: Ability to cope will improve 11/06/2017 1010 - Progressing by Geraldo Docker, RN Ability to verbalize feelings will improve 11/06/2017 1010 - Progressing by Geraldo Docker, RN   Education: Emotional status will improve 11/06/2017 1010 - Progressing by Geraldo Docker, RN Mental status will improve 11/06/2017 1010 - Progressing by Geraldo Docker, RN   Safety: Ability to remain free from injury will improve 11/06/2017 1010 - Progressing by Geraldo Docker, RN

## 2017-11-06 NOTE — Plan of Care (Addendum)
Patient found in room completing a crossword puzzle. Patient is neither visible nor social this evening. Denies SI/HI. Denies AVH. Denies pain. Reports eating and voiding adequately. Patient has no scheduled HS medications and denies need for Trazodone for sleep. CBG 132. Q 15 minute checks maintained. Will continue to monitor throughout the shift.  Patient slept 8 hours. No apparent distress. VSS. CBG 115. Will endorse care to oncoming shift. Progressing Education: Emotional status will improve 11/06/2017 2030 - Progressing by Derek Mound, RN Mental status will improve 11/06/2017 2030 - Progressing by Derek Mound, RN Safety: Ability to remain free from injury will improve 11/06/2017 2030 - Progressing by Derek Mound, RN   Not Progressing Activity: Interest or engagement in leisure activities will improve 11/06/2017 2030 - Not Progressing by Derek Mound, RN Coping: Ability to verbalize feelings will improve 11/06/2017 2030 - Not Progressing by Derek Mound, RN

## 2017-11-06 NOTE — Progress Notes (Addendum)
  [facility] Adult Case Management Discharge Plan :  Will you be returning to the same living situation after discharge:  Yes,  sister's home.  At discharge, do you have transportation home?: Yes,  pt's sister. Do you have the ability to pay for your medications: Yes,  insurance.  Release of information consent forms completed and in the chart;  Patient's signature needed at discharge.  Patient to Follow up at: Follow-up Information    [facility], [facility]. Go on 11/10/2017.   Why:  Please go to your hospital follow up appointment on Friday, 11/10/17 at [facility] first appointment will be a walk in; however, after your first appointment, you will have scheduled appointments.  Contact information: [place] [address] [phone]           Next level of care provider has access to [place] and Suicide Prevention discussed: Yes,  with pt.   Have you used any form of tobacco in the last 30 days? (Cigarettes, Smokeless Tobacco, Cigars, and/or Pipes): No  Has patient been referred to the Quitline?: N/A patient is not a smoker  Patient has been referred for addiction treatment: N/A  Alden Hipp, LCSW 11/06/2017, 3:38 PM

## 2017-11-06 NOTE — BHH Suicide Risk Assessment (Signed)
[facility] Discharge Suicide Risk Assessment   Principal Problem: Severe major depression, single episode, without psychotic features [facility]) Discharge Diagnoses:  Patient Active Problem List   Diagnosis Date Noted  . Severe major depression, single episode, without psychotic features ([place]) [F32.2] 10/31/2017    Priority: High  . Altered mental status [R41.82] 10/31/2017  . Suicide attempt ([place]) [T14.91XA] 10/31/2017  . HTN (hypertension) [I10] 09/30/2017  . GERD (gastroesophageal reflux disease) [K21.9] 09/30/2017  . Gout [M10.9] 09/30/2017  . Controlled type 2 diabetes mellitus without complication, without long-term current use of insulin ([place]) [E11.9] 09/27/2017  . Vitamin D deficiency [E55.9] 09/27/2017  . History of skin cancer [Z85.828] 09/27/2017    Total Time spent with patient: 35  Musculoskeletal: Strength & Muscle Tone: within normal limits Gait & Station: normal Patient leans: N/A  Psychiatric Specialty Exam: Review of Systems  Neurological: Negative.   Psychiatric/Behavioral: Negative.   All other systems reviewed and are negative.   Blood pressure 129/78, pulse 75, temperature 97.7 F (36.5 C), temperature source Oral, resp. rate 16, height 5\' 1"  (1.549 m), weight 80.3 kg (177 lb 0.5 oz), SpO2 100 %.Body mass index is 33.45 kg/m.  General Appearance: Casual  Eye Contact::  Good  Speech:  Clear and Coherent409  Volume:  Normal  Mood:  Euthymic  Affect:  Appropriate  Thought Process:  Goal Directed and Descriptions of Associations: Intact  Orientation:  Full (Time, Place, and Person)  Thought Content:  WDL  Suicidal Thoughts:  No  Homicidal Thoughts:  No  Memory:  Immediate;   Fair Recent;   Fair Remote;   Fair  Judgement:  Impaired  Insight:  Present  Psychomotor Activity:  Normal  Concentration:  Fair  Recall:  [employer] of Knowledge:Fair  Language: Fair  Akathisia:  No  Handed:  Right  AIMS (if indicated):     Assets:  Communication Skills Desire  for Improvement Financial Resources/Insurance Housing Physical Health Resilience Social Support  Sleep:  Number of Hours: 8.15  Cognition: WNL  ADL's:  Intact   Mental Status Per Nursing Assessment::   On Admission:  Self-harm behaviors  Demographic Factors:  Age 6 or older, Divorced or widowed and Caucasian  Loss Factors: Loss of significant relationship  Historical Factors: Impulsivity  Risk Reduction Factors:   Sense of responsibility to family, Living with another person, especially a relative and Positive social support  Continued Clinical Symptoms:  Depression:   Impulsivity  Cognitive Features That Contribute To Risk:  None    Suicide Risk:  Minimal: No identifiable suicidal ideation.  Patients presenting with no risk factors but with morbid ruminations; may be classified as minimal risk based on the severity of the depressive symptoms  Follow-up Information    [facility], [place] on 11/17/2017.   Why:  Please go to your hospital follow up appointment on Friday, 11/17/17 at 12:45PM. Please bring your insurance card and a photo ID. Thank you!  Contact information: [place] [address] [phone]           Plan Of Care/Follow-up recommendations:  Activity:  as tolerated Diet:  low sodium heart healthy ADA diet Other:  keep follow up appointments  Orson Slick, MD 11/06/2017, 3:09 PM

## 2017-11-06 NOTE — Telephone Encounter (Signed)
This is been managed over the weekend

## 2017-11-06 NOTE — Discharge Summary (Addendum)
Physician Discharge Summary Note  Patient:  Lisa Campbell is an 84 y.o., female MRN:  [id number] DOB:  1928/03/27 Patient phone:  [phone] (home)  Patient address:   [address] [place] [address],  Total Time spent with patient: 38  Date of Admission:  11/05/2017 Date of Discharge: 11/09/2017  Reason for Admission:  Overdose.  History of Present Illness:   "I moved down from [place], and I miss my home"  84 year old woman with no  past psychiatric history admitted from medical floor. Pt admitted under IVC.  Per report, and per psych consult note - pt comes into the hospital after taking an overdose of her diabetes medicine( metformin and Januvia). According to the collateral history from her family she and her sister had an argument this afternoon after which the patient locked herself in her bedroom. When the door was broken through patient was showing altered mental status and had taken a large overdose of her oral diabetes medicines. Reportedly patient stated here in the emergency room that she did it in order to die. Patient tells me that she has not been feeling well ever since she moved down to [place] in January. She wishes she had stayed in [place]. Her sleep habits are poor. She is not eating well. She feels tired all the time. Does not feel like she has anything to do. Seems to feel a great deal of regret. She denies it when I ask her directly if she had had thoughts about killing herself but it seems obvious from her behavior that this was an impulsive suicide attempt. Patient is apparently not receiving any mental health treatment.  Pt calm, ox3, reports feeling depressed after moving from [place] in January to be near by her sister, currently living in  [facility]. . Pt reports " I lived all my  life in [place] and miss home", states she sold  her house  couple of months ago.  Pt admits wanting to kill self  at that time, but states "that was the wrong thing to do", denies SI  at this time, Denies HI. Denies AVH. Denies past psych hx or tx. Denies substance use issues. Pt already started on Celexa, pt taking it, denies side effects.   Associated Signs/Symptoms: Depression Symptoms:  depressed mood, insomnia, fatigue, anxiety, decreased appetite, (Hypo) Manic Symptoms:  no Anxiety Symptoms:  Excessive Worry, Psychotic Symptoms:  denies PTSD Symptoms: denies  Past Psychiatric History: Patient denies ever seeing a psychiatrist therapist or mental health provider in the past. Denies ever being on any medicine for depression or anxiety. Denies any past suicide attempts.  Family psychiatric history: None reported  Social History: Has no children of her own. Currently living with her sister in an apartment at the [place] awaiting her own. Had been living independently in [place] until a couple months ago. She is a retired Optometrist.  Principal Problem: Severe major depression, single episode, without psychotic features [facility]) Discharge Diagnoses: Patient Active Problem List   Diagnosis Date Noted  . Severe major depression, single episode, without psychotic features ([place]) [F32.2] 10/31/2017    Priority: High  . Altered mental status [R41.82] 10/31/2017  . Suicide attempt ([place]) [T14.91XA] 10/31/2017  . HTN (hypertension) [I10] 09/30/2017  . GERD (gastroesophageal reflux disease) [K21.9] 09/30/2017  . Gout [M10.9] 09/30/2017  . Controlled type 2 diabetes mellitus without complication, without long-term current use of insulin ([place]) [E11.9] 09/27/2017  . Vitamin D deficiency [  E55.9] 09/27/2017  . History of skin cancer [Z85.828] 09/27/2017   Past Medical History:  Past Medical History:  Diagnosis Date  . Arthritis   . Cancer ([place])    skin mohs left ear ? 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 Colon polyps   . Gastric ulcer   . GERD (gastroesophageal  reflux disease)   . Hyperlipidemia   . Hypertension   . Rotator cuff disorder    right  . Shingles     Past Surgical History:  Procedure Laterality Date  . KNEE ARTHROSCOPY     b/l    Family History:  Family History  Problem Relation Age of Onset  . Cancer Mother        ?colon   . Diabetes Mother   . Early death Mother   . Heart disease Mother   . Hypertension Mother   . Hyperlipidemia Mother   . Cancer Father        ? MM  . Diabetes Sister   . Diabetes Brother   . Arthritis Brother    Social History:  Social History   Substance and Sexual Activity  Alcohol Use Yes     Social History   Substance and Sexual Activity  Drug Use No    Social History   Socioeconomic History  . Marital status: Single    Spouse name: Not on file  . Number of children: Not on file  . Years of education: Not on file  . Highest education level: Not on file  Occupational History  . Occupation: retired  Scientific laboratory technician  . Financial resource strain: Not on file  . Food insecurity:    Worry: Not on file    Inability: Not on file  . Transportation needs:    Medical: Not on file    Non-medical: Not on file  Tobacco Use  . Smoking status: Never Smoker  . Smokeless tobacco: Never Used  Substance and Sexual Activity  . Alcohol use: Yes  . Drug use: No  . Sexual activity: Never  Lifestyle  . Physical activity:    Days per week: Not on file    Minutes per session: Not on file  . Stress: Not on file  Relationships  . Social connections:    Talks on phone: Not on file    Gets together: Not on file    Attends religious service: Not on file    Active member of club or organization: Not on file    Attends meetings of clubs or organizations: Not on file    Relationship status: Not on file  Other Topics Concern  . Not on file  Social History Narrative   Retired former Lawyer, assoc. Degree    Moved from [place]    Wears seat belts, no guns at home    No exercise      Hospital Course:    Ms. Lisa Campbell is an 84 year old female with no past psychiatric history transferred from medical floor where she was hospitalized after suicide attempt by medication overdose. The patient who used to live independently relocated to [facility] in January, to stay with her sister while awaiting her own apartment at the [place]. The patient is no longer suicidal or homicidal. She knows she made "a mistake". She is able to contract for safety.   Even though her situation has changed, she is no longer able to live with her sister, she is forward  thinking and optimistic about the future. The patient will stay temporarily at the [facility] which is a part of [place] while waiting for her own apartment there.   #Mood, improved -continue Celexa 10 mg daily   #Diabetes, diet controlled -BS are normal  #Disposition -she was discharged to [facility] -follow up with [facility]  Physical Findings: AIMS: Facial and Oral Movements Muscles of Facial Expression: None, normal Lips and Perioral Area: None, normal Jaw: None, normal Tongue: None, normal,Extremity Movements Upper (arms, wrists, hands, fingers): None, normal Lower (legs, knees, ankles, toes): None, normal, Trunk Movements Neck, shoulders, hips: None, normal, Overall Severity Severity of abnormal movements (highest score from questions above): None, normal Incapacitation due to abnormal movements: None, normal Patient's awareness of abnormal movements (rate only patient's report): No Awareness, Dental Status Current problems with teeth and/or dentures?: No Does patient usually wear dentures?: No  CIWA:    COWS:     Musculoskeletal: Strength & Muscle Tone: within normal limits Gait & Station: normal Patient leans: N/A  Psychiatric Specialty Exam: Physical Exam  Nursing note and vitals reviewed. Psychiatric: She has a normal mood and affect. Her behavior is  normal. Thought content normal. Cognition and memory are normal. She expresses impulsivity.    Review of Systems  Neurological: Negative.   Psychiatric/Behavioral: Negative.   All other systems reviewed and are negative.   Blood pressure 133/64, pulse 93, temperature 97.9 F (36.6 C), resp. rate 16, height 5\' 1"  (1.549 m), weight 80.3 kg (177 lb 0.5 oz), SpO2 96 %.Body mass index is 33.45 kg/m.  General Appearance: Casual  Eye Contact:  Good  Speech:  Clear and Coherent  Volume:  Normal  Mood:  Euthymic  Affect:  Appropriate  Thought Process:  Goal Directed and Descriptions of Associations: Intact  Orientation:  Full (Time, Place, and Person)  Thought Content:  WDL  Suicidal Thoughts:  No  Homicidal Thoughts:  No  Memory:  Immediate;   Fair Recent;   Fair Remote;   Fair  Judgement:  Impaired  Insight:  Present  Psychomotor Activity:  Normal  Concentration:  Concentration: Fair and Attention Span: Fair  Recall:  [employer] of Knowledge:  Fair  Language:  Fair  Akathisia:  No  Handed:  Right  AIMS (if indicated):     Assets:  Communication Skills Desire for Improvement Financial Resources/Insurance Housing Physical Health Resilience Social Support  ADL's:  Intact  Cognition:  WNL  Sleep:  Number of Hours: 7.5     Have you used any form of tobacco in the last 30 days? (Cigarettes, Smokeless Tobacco, Cigars, and/or Pipes): No  Has this patient used any form of tobacco in the last 30 days? (Cigarettes, Smokeless Tobacco, Cigars, and/or Pipes) Yes, No  Blood Alcohol level:  No results found for: [facility]  Metabolic Disorder Labs:  Lab Results  Component Value Date   HGBA1C 5.4 11/06/2017   MPG 108.28 11/06/2017   No results found for: PROLACTIN Lab Results  Component Value Date   CHOL 239 (H) 11/06/2017   TRIG 221 (H) 11/06/2017   HDL 57 11/06/2017   CHOLHDL 4.2 11/06/2017   VLDL 44 (H) 11/06/2017   LDLCALC 138 (H) 11/06/2017   LDLCALC 157 (H) 09/27/2017     See Psychiatric Specialty Exam and Suicide Risk Assessment completed by Attending Physician prior to discharge.  Discharge destination:  Other:  [place]  Is patient on multiple antipsychotic therapies at discharge:  No  Has Patient had three or more failed trials of antipsychotic monotherapy by history:  No  Recommended Plan for Multiple Antipsychotic Therapies: NA  Discharge Instructions    Diet - low sodium heart healthy   Complete by:  As directed    Diet - low sodium heart healthy   Complete by:  As directed    Increase activity slowly   Complete by:  As directed    Increase activity slowly   Complete by:  As directed      Allergies as of 11/09/2017      Reactions   Penicillins    ?rxn    Statins    ? Rxn cant tolerate crestor and lipitor noted prev records.       Medication List    TAKE these medications     Indication  citalopram 10 MG tablet Commonly known as:  CELEXA Take 1 tablet (10 mg total) by mouth daily.  Indication:  Depression   traZODone 50 MG tablet Commonly known as:  DESYREL Take 1 tablet (50 mg total) by mouth at bedtime as needed for sleep.  Indication:  Trouble Sleeping   Vitamin D3 2000 units Tabs Take 2,000 Units by mouth daily.  Indication:  general health      Follow-up Information    [facility], [employer]. Go on 11/10/2017.   Why:  Please go to your hospital follow up appointment on Friday, 11/10/17 at [place] first appointment will be a walk in; however, after your first appointment, you will have scheduled appointments.  Contact information: [address] [place] [address] [phone]           Follow-up recommendations:  Activity:  as tolerated Diet:  low sodium heart healthy ADA diet Other:  keep follow up appointments  Comments:    Signed: Orson Slick, MD 11/09/2017, 8:53 AM

## 2017-11-06 NOTE — BHH Suicide Risk Assessment (Signed)
[place] INPATIENT:  Family/Significant Other Suicide Prevention Education  Suicide Prevention Education:  Education Completed; Carmon Ginsberg, pt's sister, at  [phone]  has been identified by the patient as the family member/significant other with whom the patient will be residing, and identified as the person(s) who will aid the patient in the event of a mental health crisis (suicidal ideations/suicide attempt).  With written consent from the patient, the family member/significant other has been provided the following suicide prevention education, prior to the and/or following the discharge of the patient.  The suicide prevention education provided includes the following:  Suicide risk factors  Suicide prevention and interventions  National Suicide Hotline telephone number  [employer] [facility] assessment telephone number  [facility] Emergency Assistance [place] and/or Residential Mobile Crisis Unit telephone number  Request made of family/significant other to:  Remove weapons (e.g., guns, rifles, knives), all items previously/currently identified as safety concern.    Remove drugs/medications (over-the-counter, prescriptions, illicit drugs), all items previously/currently identified as a safety concern.  The family member/significant other verbalizes understanding of the suicide prevention education information provided.  The family member/significant other agrees to remove the items of safety concern listed above.   Pt's sister stated, "I would like to consult with me to tell me what is her behavior going to be when she gets here. I need to know if she's going to kill herself again. I want her to have at least two weeks of counseling in [place]. We can't drive out of [place]. I want somewhere in [place], even if I have to pay out of pocket. She was acting normal before she came there too. She needs more treatment."    Alden Hipp, LCSW 11/06/2017, 3:16  PM

## 2017-11-07 LAB — GLUCOSE, CAPILLARY
GLUCOSE-CAPILLARY: 115 mg/dL — AB (ref 65–99)
Glucose-Capillary: 84 mg/dL (ref 65–99)
Glucose-Capillary: 115 mg/dL — ABNORMAL HIGH (ref 65–99)
Glucose-Capillary: 134 mg/dL — ABNORMAL HIGH (ref 65–99)

## 2017-11-07 NOTE — BHH Group Notes (Signed)
  11/07/2017  Time: 0900  Type of Therapy and Topic: Group Therapy: Goals Group: SMART Goals   Participation Level:  Minimal   Description of Group:   The purpose of a daily goals group is to assist and guide patients in setting recovery/wellness-related goals. The objective is to set goals as they relate to the crisis in which they were admitted. Patients will be using SMART goal modalities to set measurable goals. Characteristics of realistic goals will be discussed and patients will be assisted in setting and processing how one will reach their goal. Facilitator will also assist patients in applying interventions and coping skills learned in psycho-education groups to the SMART goal and process how one will achieve defined goal.   Therapeutic Goals:  -Patients will develop and document one goal related to or their crisis in which brought them into treatment.  -Patients will be guided by LCSW using SMART goal setting modality in how to set a measurable, attainable, realistic and time sensitive goal.  -Patients will process barriers in reaching goal.  -Patients will process interventions in how to overcome and successful in reaching goal.   Patient's Goal: Pt continues to work towards their tx goals but has not yet reached them. Pt was able to appropriately participate in group discussion, and was able to offer support/validation to other group members. Pt reported her goal for the day is to, "be released by the end of the day, and to speak with the doctor at least 1x by the end of today."   Therapeutic Modalities:  Motivational Interviewing  Cognitive Behavioral Therapy  Crisis Intervention Model  SMART goals setting  Alden Hipp, MSW, LCSW 11/07/2017 9:30 AM

## 2017-11-07 NOTE — BHH Group Notes (Signed)
[facility] Group Notes:  (Nursing/MHT/Case Management/Adjunct)  Date:  11/07/2017  Time:  11:09 PM  Type of Therapy:  Group Therapy  Participation Level:  Did Not Attend   Nehemiah Settle 11/07/2017, 11:09 PM

## 2017-11-07 NOTE — BHH Group Notes (Signed)
11/07/2017 1PM  Type of Therapy/Topic:  Group Therapy:  Feelings about Diagnosis  Participation Level:  Did Not Attend   Description of Group:   This group will allow patients to explore their thoughts and feelings about diagnoses they have received. Patients will be guided to explore their level of understanding and acceptance of these diagnoses. Facilitator will encourage patients to process their thoughts and feelings about the reactions of others to their diagnosis and will guide patients in identifying ways to discuss their diagnosis with significant others in their lives. This group will be process-oriented, with patients participating in exploration of their own experiences, giving and receiving support, and processing challenge from other group members.   Therapeutic Goals: 1. Patient will demonstrate understanding of diagnosis as evidenced by identifying two or more symptoms of the disorder 2. Patient will be able to express two feelings regarding the diagnosis 3. Patient will demonstrate their ability to communicate their needs through discussion and/or role play  Summary of Patient Progress: Patient was encouraged and invited to attend group. Patient did not attend group. Social worker will continue to encourage group participation in the future.      Therapeutic Modalities:   Cognitive Behavioral Therapy Brief Therapy Feelings Identification    Darin Engels, LCSW 11/07/2017 2:00 PM

## 2017-11-07 NOTE — BHH Group Notes (Signed)
[facility] Group Notes:  (Nursing/MHT/Case Management/Adjunct)  Date:  11/07/2017  Time:  4:16 PM  Type of Therapy:  Psychoeducational Skills  Participation Level:  Did Not Attend  P  Drake Leach 11/07/2017, 4:16 PM

## 2017-11-07 NOTE — BHH Group Notes (Signed)
LCSW Group Therapy Note   11/06/2017 1:00pm   Type of Therapy and Topic:  Group Therapy:  Overcoming Obstacles   Participation Level:  Did Not Attend   Description of Group:    In this group patients will be encouraged to explore what they see as obstacles to their own wellness and recovery. They will be guided to discuss their thoughts, feelings, and behaviors related to these obstacles. The group will process together ways to cope with barriers, with attention given to specific choices patients can make. Each patient will be challenged to identify changes they are motivated to make in order to overcome their obstacles. This group will be process-oriented, with patients participating in exploration of their own experiences as well as giving and receiving support and challenge from other group members.   Therapeutic Goals: 1. Patient will identify personal and current obstacles as they relate to admission. 2. Patient will identify barriers that currently interfere with their wellness or overcoming obstacles.  3. Patient will identify feelings, thought process and behaviors related to these barriers. 4. Patient will identify two changes they are willing to make to overcome these obstacles:      Summary of Patient Progress      Therapeutic Modalities:   Cognitive Behavioral Therapy Solution Focused Therapy Motivational Interviewing Relapse Prevention Therapy  August Saucer, LCSW 11/07/2017 10:52 AM

## 2017-11-07 NOTE — Progress Notes (Signed)
[facility] MD Progress Note  11/07/2017 1:52 PM Lisa Campbell  MRN:  [id number]  Subjective:   Ms. Oberle denies any symptoms of depression, anxiety or psychosis. She is no longer suicidal or homicidal. She accepts medications and tolerrates them well. There are no somatic complaints. Good program participation.   Spoke with her sister who is very frightened and surprised with overdose. She does not believe that the payient is safe for discharge. Family meeting a must.  Treatment plan. Continue Lexapro.  Social/disposition. Discharge with the sister. Follow up with [facility]. Reportedly waiting time for [facility] is 2 years. The patient has been on the list since January.  Principal Problem: Severe major depression, single episode, without psychotic features ([place]) Diagnosis:   Patient Active Problem List   Diagnosis Date Noted  . Severe major depression, single episode, without psychotic features ([place]) [F32.2] 10/31/2017    Priority: High  . Altered mental status [R41.82] 10/31/2017  . Suicide attempt ([place]) [T14.91XA] 10/31/2017  . HTN (hypertension) [I10] 09/30/2017  . GERD (gastroesophageal reflux disease) [K21.9] 09/30/2017  . Gout [M10.9] 09/30/2017  . Controlled type 2 diabetes mellitus without complication, without long-term current use of insulin ([place]) [E11.9] 09/27/2017  . Vitamin D deficiency [E55.9] 09/27/2017  . History of skin cancer [Z85.828] 09/27/2017   Total Time spent with patient: 30 minutes  Past Psychiatric History: none  Past Medical History:  Past Medical History:  Diagnosis Date  . Arthritis   . Cancer ([place])    skin mohs left ear ? 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 Colon polyps   . Gastric ulcer   . GERD (gastroesophageal reflux disease)   . Hyperlipidemia   . Hypertension   . Rotator cuff disorder    right  . Shingles     Past Surgical History:  Procedure Laterality Date  . KNEE ARTHROSCOPY     b/l    Family  History:  Family History  Problem Relation Age of Onset  . Cancer Mother        ?colon   . Diabetes Mother   . Early death Mother   . Heart disease Mother   . Hypertension Mother   . Hyperlipidemia Mother   . Cancer Father        ? MM  . Diabetes Sister   . Diabetes Brother   . Arthritis Brother    Family Psychiatric  History: none Social History:  Social History   Substance and Sexual Activity  Alcohol Use Yes     Social History   Substance and Sexual Activity  Drug Use No    Social History   Socioeconomic History  . Marital status: Single    Spouse name: None  . Number of children: None  . Years of education: None  . Highest education level: None  Social Needs  . Financial resource strain: None  . Food insecurity - worry: None  . Food insecurity - inability: None  . Transportation needs - medical: None  . Transportation needs - non-medical: None  Occupational History  . Occupation: retired  Tobacco Use  . Smoking status: Never Smoker  . Smokeless tobacco: Never Used  Substance and Sexual Activity  . Alcohol use: Yes  . Drug use: No  . Sexual activity: No  Other Topics Concern  . None  Social History Narrative   Retired former Lawyer, assoc. Degree    Moved from [place]  Wears seat belts, no guns at home    No exercise    Additional Social History:  Specify valuables returned: clothing                      Sleep: Fair  Appetite:  Fair  Current Medications: Current Facility-Administered Medications  Medication Dose Route Frequency Provider Last Rate Last Dose  . acetaminophen (TYLENOL) tablet 650 mg  650 mg Oral Q6H PRN Clapacs, John T, MD      . alum & mag hydroxide-simeth (MAALOX/MYLANTA) 200-200-20 MG/5ML suspension 30 mL  30 mL Oral Q4H PRN Clapacs, John T, MD      . citalopram (CELEXA) tablet 10 mg  10 mg Oral Daily Clapacs, Madie Reno, MD   10 mg at 11/07/17 0814  . magnesium hydroxide (MILK OF MAGNESIA)  suspension 30 mL  30 mL Oral Daily PRN Clapacs, Madie Reno, MD      . senna-docusate (Senokot-S) tablet 1 tablet  1 tablet Oral QHS PRN Clapacs, Madie Reno, MD      . traZODone (DESYREL) tablet 50 mg  50 mg Oral QHS PRN Clapacs, Madie Reno, MD        Lab Results:  Results for orders placed or performed during the hospital encounter of 11/05/17 (from the past 48 hour(s))  Glucose, capillary     Status: Abnormal   Collection Time: 11/05/17  7:25 PM  Result Value Ref Range   Glucose-Capillary 135 (H) 65 - 99 mg/dL  Hemoglobin A1c     Status: None   Collection Time: 11/06/17  6:44 AM  Result Value Ref Range   Hgb A1c MFr Bld 5.4 4.8 - 5.6 %    Comment: (NOTE) Pre diabetes:          5.7%-6.4% Diabetes:              >6.4% Glycemic control for   <7.0% adults with diabetes    Mean Plasma Glucose 108.28 mg/dL    Comment: Performed at [facility] [address] [place], [place] [address]  Lipid panel     Status: Abnormal   Collection Time: 11/06/17  6:44 AM  Result Value Ref Range   Cholesterol 239 (H) 0 - 200 mg/dL   Triglycerides 221 (H) <150 mg/dL   HDL 57 >40 mg/dL   Total CHOL/HDL Ratio 4.2 RATIO   VLDL 44 (H) 0 - 40 mg/dL   LDL Cholesterol 138 (H) 0 - 99 mg/dL    Comment:        Total Cholesterol/HDL:CHD Risk Coronary Heart Disease Risk Table                     Men   Women  1/2 Average Risk   3.4   3.3  Average Risk       5.0   4.4  2 X Average Risk   9.6   7.1  3 X Average Risk  23.4   11.0        Use the calculated Patient Ratio above and the CHD Risk Table to determine the patient's CHD Risk.        ATP III CLASSIFICATION (LDL):  <100     mg/dL   Optimal  100-129  mg/dL   Near or Above                    Optimal  130-159  mg/dL   Borderline  160-189  mg/dL  High  >190     mg/dL   Very High Performed at [facility], [address] [place], [place] [address]   TSH     Status: None   Collection Time: 11/06/17  6:44 AM  Result Value Ref Range   TSH 3.808  0.350 - 4.500 uIU/mL    Comment: Performed by a 3rd Generation assay with a functional sensitivity of <=0.01 uIU/mL. Performed at [facility], [address] [place], [place] [address]   Glucose, capillary     Status: Abnormal   Collection Time: 11/06/17  7:04 AM  Result Value Ref Range   Glucose-Capillary 133 (H) 65 - 99 mg/dL   Comment 1 Notify RN   Glucose, capillary     Status: Abnormal   Collection Time: 11/06/17 11:48 AM  Result Value Ref Range   Glucose-Capillary 106 (H) 65 - 99 mg/dL  Glucose, capillary     Status: Abnormal   Collection Time: 11/06/17  5:48 PM  Result Value Ref Range   Glucose-Capillary 157 (H) 65 - 99 mg/dL  Glucose, capillary     Status: Abnormal   Collection Time: 11/06/17  9:11 PM  Result Value Ref Range   Glucose-Capillary 132 (H) 65 - 99 mg/dL  Glucose, capillary     Status: Abnormal   Collection Time: 11/07/17  6:15 AM  Result Value Ref Range   Glucose-Capillary 115 (H) 65 - 99 mg/dL  Glucose, capillary     Status: None   Collection Time: 11/07/17 11:42 AM  Result Value Ref Range   Glucose-Capillary 84 65 - 99 mg/dL    Blood Alcohol level:  No results found for: [facility]  Metabolic Disorder Labs: Lab Results  Component Value Date   HGBA1C 5.4 11/06/2017   MPG 108.28 11/06/2017   No results found for: PROLACTIN Lab Results  Component Value Date   CHOL 239 (H) 11/06/2017   TRIG 221 (H) 11/06/2017   HDL 57 11/06/2017   CHOLHDL 4.2 11/06/2017   VLDL 44 (H) 11/06/2017   LDLCALC 138 (H) 11/06/2017   LDLCALC 157 (H) 09/27/2017    Physical Findings: AIMS: Facial and Oral Movements Muscles of Facial Expression: None, normal Lips and Perioral Area: None, normal Jaw: None, normal Tongue: None, normal,Extremity Movements Upper (arms, wrists, hands, fingers): None, normal Lower (legs, knees, ankles, toes): None, normal, Trunk Movements Neck, shoulders, hips: None, normal, Overall Severity Severity of abnormal movements (highest  score from questions above): None, normal Incapacitation due to abnormal movements: None, normal Patient's awareness of abnormal movements (rate only patient's report): No Awareness, Dental Status Current problems with teeth and/or dentures?: No Does patient usually wear dentures?: No  CIWA:    COWS:     Musculoskeletal: Strength & Muscle Tone: within normal limits Gait & Station: normal Patient leans: N/A  Psychiatric Specialty Exam: Physical Exam  Nursing note and vitals reviewed. Psychiatric: She has a normal mood and affect. Her speech is normal and behavior is normal. Thought content normal. Cognition and memory are normal. She expresses impulsivity.    Review of Systems  Neurological: Negative.   Psychiatric/Behavioral: Negative.   All other systems reviewed and are negative.   Blood pressure (!) 148/65, pulse 78, temperature 98.6 F (37 C), temperature source Oral, resp. rate 18, height 5\' 1"  (1.549 m), weight 80.3 kg (177 lb 0.5 oz), SpO2 99 %.Body mass index is 33.45 kg/m.  General Appearance: Casual  Eye Contact:  Good  Speech:  Clear and Coherent  Volume:  Normal  Mood:  Euthymic  Affect:  Appropriate  Thought Process:  Goal Directed and Descriptions of Associations: Intact  Orientation:  Full (Time, Place, and Person)  Thought Content:  WDL  Suicidal Thoughts:  No  Homicidal Thoughts:  No  Memory:  Immediate;   Fair Recent;   Fair Remote;   Fair  Judgement:  Impaired  Insight:  Present  Psychomotor Activity:  Normal  Concentration:  Concentration: Fair and Attention Span: Fair  Recall:  [employer] of Knowledge:  Fair  Language:  Fair  Akathisia:  No  Handed:  Right  AIMS (if indicated):     Assets:  Communication Skills Desire for Improvement Financial Resources/Insurance Housing Physical Health Resilience Social Support  ADL's:  Intact  Cognition:  WNL  Sleep:  Number of Hours: 8     Treatment Plan Summary: Daily contact with patient to  assess and evaluate symptoms and progress in treatment and Medication management   Ms. Collwel is an 84 year old female with no past psychiatric history transferred from medical floor where she was hospitalized after suicide attempt by medication overdose.The patient who used to live independently relocated to [facility] in January, lives with her sister while awaiting an opening at the [facility]. She is home sick. Suicidal ideationhas resolved. The patient knows it was "a mistake". She is able to contract for safety. She is forward thinking and optimistic about the future.  #Suicidal ideation, resolved -patient is able to contract for safety in the hospital  #Mood Continue Lexapro 10 mg daily  #Diabetes -BSare normal  #Disposition -shewas discharged to home with her sister -follow up [facility]    Orson Slick, MD 11/07/2017, 1:52 PM

## 2017-11-07 NOTE — Plan of Care (Signed)
Patient verbalized that she is doing fine.Denies SI,HI and AVH.Pleasant and cooperative on approach.Attended some groups.Com-pliant with medications.Support and encouragement given.

## 2017-11-08 ENCOUNTER — Inpatient Hospital Stay: Payer: Medicare Other

## 2017-11-08 LAB — BLOOD GAS, VENOUS
ACID-BASE DEFICIT: 10.9 mmol/L — AB (ref 0.0–2.0)
Acid-base deficit: 12.3 mmol/L — ABNORMAL HIGH (ref 0.0–2.0)
BICARBONATE: 17.2 mmol/L — AB (ref 20.0–28.0)
Bicarbonate: 15.3 mmol/L — ABNORMAL LOW (ref 20.0–28.0)
PATIENT TEMPERATURE: 37
PCO2 VEN: 46 mmHg (ref 44.0–60.0)
PCO2 VEN: 40 mmHg — AB (ref 44.0–60.0)
PH VEN: 7.32 (ref 7.250–7.430)
Patient temperature: 37
Patient temperature: 37
pCO2, Ven: 44 mmHg (ref 44.0–60.0)
pH, Ven: 7.18 — CL (ref 7.250–7.430)
pH, Ven: 7.19 — CL (ref 7.250–7.430)

## 2017-11-08 LAB — GLUCOSE, CAPILLARY: GLUCOSE-CAPILLARY: 116 mg/dL — AB (ref 65–99)

## 2017-11-08 MED ORDER — TRAZODONE HCL 50 MG PO TABS: 50.0000 mg | ORAL_TABLET | Freq: Every evening | ORAL | 1 refills | Status: DC | PRN

## 2017-11-08 MED ORDER — VITAMIN D3 50 MCG (2000 UT) PO TABS: 2000.0000 [IU] | ORAL_TABLET | Freq: Every day | ORAL | 1 refills | Status: AC

## 2017-11-08 MED ORDER — TUBERCULIN PPD 5 UNIT/0.1ML ID SOLN
5.0000 [IU] | Freq: Once | INTRADERMAL | Status: DC
  Filled 2017-11-08: qty 0.1

## 2017-11-08 NOTE — Progress Notes (Signed)
Patient ID: Lisa Campbell, female   DOB: [birth year], 84 y.o.   MRN: [id number]   CSW reattempted contact with Sharyn Lull, admissions coordinator at [facility], at [phone]. Sharyn Lull reported that they will place pt at their assisted living facility, and would like her to arrive before 12PM. Sharyn Lull reported they will need an FL2 and discharge summary. Sharyn Lull reported, at this time, she is unsure what room they will put pt in, but stated she will call CSW in the morning to provide a room number and phone number for the nurse to give report. CSW will follow up as needed.   Alden Hipp, MSW, LCSW 11/08/2017 3:52 PM

## 2017-11-08 NOTE — Progress Notes (Signed)
Patient ID: Lisa Campbell, female   DOB: 18-Oct-1927, 84 y.o.   MRN: [id number]   CSW received a call from MD who reported she was informed pt could receive a bed at the [facility] tomorrow, 11/09/17, if pt was in agreement.   CSW spoke with pt and explained the proposed discharge plan. Pt expressed understanding and agreement with this discharge plan and stated, "it's a shame I can't go to my sisters, but I'm happy to have somewhere to go." CSW will continue to support pt throughout her admission.   CSW contacted Carmon Ginsberg, pt's sister, at  [phone], to discuss pt's proposed discharge plan. Pt's sister was happy to hear pt would have a bed at the assisted living facility until her apartment became available. CSW asked if pt's sister would be willing to transport pt to [facility] tomorrow following the family meeting scheduled for 9:30AM. Pt's sister stated she would be able to do that, and also expressed gratitude that pt was in agreement with the discharge plan too. CSW will continue to coordinate with pt's sister as needed for further updates and discharge planning.   CSW attempted to contact the admissions coordinator at [facility] [place], at [phone]. She did not answer, and CSW was not given the option to leave a voicemail. CSW will reattempt.   Alden Hipp, MSW, LCSW 11/08/2017 3:47 PM

## 2017-11-08 NOTE — Plan of Care (Signed)
Patient has shown interest in leisure activity on the unit by coming out in the milieu and has been interacting well with other members on the unit without any issues at this time. Patient stated to this writer that she slept pretty good and has improved balancing her sleep/wake cycle and she also has the ability to cope. Patient has the ability to cope and has been observed in the dayroom playing cards without any issues at this time. Patient denies SI/HI/AVH stating '"I have never had hallucinations". Patient also denies any signs/symptoms of depression/anxiety at this time. Patient has remained free from injury on the unit and is safe on the unit at this time.

## 2017-11-08 NOTE — BHH Suicide Risk Assessment (Signed)
[facility] Discharge Suicide Risk Assessment   Principal Problem: Severe major depression, single episode, without psychotic features [facility]) Discharge Diagnoses:  Patient Active Problem List   Diagnosis Date Noted  . Severe major depression, single episode, without psychotic features ([place]) [F32.2] 10/31/2017    Priority: High  . Altered mental status [R41.82] 10/31/2017  . Suicide attempt ([name]) [T14.91XA] 10/31/2017  . HTN (hypertension) [I10] 09/30/2017  . GERD (gastroesophageal reflux disease) [K21.9] 09/30/2017  . Gout [M10.9] 09/30/2017  . Controlled type 2 diabetes mellitus without complication, without long-term current use of insulin ([place]) [E11.9] 09/27/2017  . Vitamin D deficiency [E55.9] 09/27/2017  . History of skin cancer [Z85.828] 09/27/2017    Total Time spent with patient: 35  Musculoskeletal: Strength & Muscle Tone: within normal limits Gait & Station: normal Patient leans: N/A  Psychiatric Specialty Exam: Review of Systems  Neurological: Negative.   Psychiatric/Behavioral: Negative.   All other systems reviewed and are negative.   Blood pressure (!) 138/56, pulse 79, temperature 97.9 F (36.6 C), resp. rate 16, height 5\' 1"  (1.549 m), weight 80.3 kg (177 lb 0.5 oz), SpO2 98 %.Body mass index is 33.45 kg/m.  General Appearance: Casual  Eye Contact::  Good  Speech:  Clear and Coherent409  Volume:  Normal  Mood:  Euthymic  Affect:  Appropriate  Thought Process:  Goal Directed and Descriptions of Associations: Intact  Orientation:  Full (Time, Place, and Person)  Thought Content:  WDL  Suicidal Thoughts:  No  Homicidal Thoughts:  No  Memory:  Immediate;   Fair Recent;   Fair Remote;   Fair  Judgement:  Impaired  Insight:  Present  Psychomotor Activity:  Normal  Concentration:  Fair  Recall:  [employer] of Knowledge:Fair  Language: Fair  Akathisia:  No  Handed:  Right  AIMS (if indicated):     Assets:  Communication Skills Desire for  Improvement Financial Resources/Insurance Housing Physical Health Resilience  Sleep:  Number of Hours: 7  Cognition: WNL  ADL's:  Intact   Mental Status Per Nursing Assessment::   On Admission:  NA(denies SI, HI and AVH)  Demographic Factors:  Age 84 or older, Caucasian and Living alone  Loss Factors: Loss of significant relationship  Historical Factors: Impulsivity  Risk Reduction Factors:   Sense of responsibility to family  Continued Clinical Symptoms:  Depression:   Impulsivity  Cognitive Features That Contribute To Risk:  None    Suicide Risk:  Minimal: No identifiable suicidal ideation.  Patients presenting with no risk factors but with morbid ruminations; may be classified as minimal risk based on the severity of the depressive symptoms  Follow-up Information    [facility] [employer]. Go on 11/10/2017.   Why:  Please go to your hospital follow up appointment on Friday, 11/10/17 at [facility] first appointment will be a walk in; however, after your first appointment, you will have scheduled appointments.  Contact information: [place] [address] [phone]           Plan Of Care/Follow-up recommendations:  Activity:  as tolerated Diet:  low sodium heart healthy Other:  keep follow up appointments  Orson Slick, MD 11/08/2017, 3:46 PM

## 2017-11-08 NOTE — Progress Notes (Signed)
D- Patient alert and oriented. Patient presents in a pleasant mood on assessment reporting that she slept "pretty good" last night and has not voiced any physical complaints at this time. Patient denies SI, HI, AVH, stating to this writer "I have never had any hallucinations". Patient also denies pain at this time. Patient's goal for today is "go home".   A- Scheduled medications administered to patient, per MD orders. Support and encouragement provided.  Routine safety checks conducted every 15 minutes. Patient informed to notify staff with problems or concerns.  R- No adverse drug reactions noted. Patient contracts for safety at this time. Patient compliant with medications and treatment plan. Patient receptive, calm, and cooperative. Patient interacts well with others on the unit.  Patient remains safe at this time.

## 2017-11-08 NOTE — Progress Notes (Signed)
Patient ID: Lisa Campbell, female   DOB: 07-14-1928, 84 y.o.   MRN: [id number] Pleasant on approach, A&Ox4, logical, mood and affect very appropriate, "I was homesick, depressed and having difficulty sleeping recently, I took medications to go to sleep and not to kill myself. I lived in [place] for 85 years and as I become of age and never married or have kids, I moved here in January to stay with my 11 year old sister at [facility]; I was just homesick and I have been awaiting an apartment to be ready so that I can have my own apartment" Patient ambulates without gait impairment.

## 2017-11-08 NOTE — Plan of Care (Signed)
Patient slept for Estimated Hours of 7; Precautionary checks every 15 minutes for safety maintained, room free of safety hazards, patient sustains no injury or falls during this shift.  

## 2017-11-08 NOTE — NC FL2 (Signed)
  [place] LEVEL OF CARE SCREENING TOOL     IDENTIFICATION  Patient Name: Lisa Campbell Birthdate: August 23, 1927 Sex: female Admission Date (Current Location): 11/05/2017  [facility] and [place] Number:  Engineering geologist and Address:  [facility] [address], [place], [place] [address]      Provider Number: [id number]  Attending Physician Name and Address:  Clovis Fredrickson, MD  Relative Name and Phone Number:  Carmon Ginsberg at [phone]    Current Level of Care: Domiciliary (Rest home) Recommended Level of Care: [place] Prior Approval Number:    Date Approved/Denied:   PASRR Number:    Discharge Plan: Domiciliary (Rest home)    Current Diagnoses: Patient Active Problem List   Diagnosis Date Noted  . Altered mental status 10/31/2017  . Suicide attempt ([place]) 10/31/2017  . Severe major depression, single episode, without psychotic features ([place]) 10/31/2017  . HTN (hypertension) 09/30/2017  . GERD (gastroesophageal reflux disease) 09/30/2017  . Gout 09/30/2017  . Controlled type 2 diabetes mellitus without complication, without long-term current use of insulin ([place]) 09/27/2017  . Vitamin D deficiency 09/27/2017  . History of skin cancer 09/27/2017    Orientation RESPIRATION BLADDER Height & Weight     Self, Time, Situation, Place  Normal Continent Weight: 177 lb 0.5 oz (80.3 kg) Height:  5\' 1"  (154.9 cm)  BEHAVIORAL SYMPTOMS/MOOD NEUROLOGICAL BOWEL NUTRITION STATUS  (n/a) (n/a) Continent Diet  AMBULATORY STATUS COMMUNICATION OF NEEDS Skin   Independent Verbally Normal                       Personal Care Assistance Level of Assistance  (n/a)           Functional Limitations Info  (n/a)          SPECIAL CARE FACTORS FREQUENCY  (None)                    Contractures Contractures Info: Not present    Additional Factors Info  Allergies   Allergies Info: penicillin,  statins           Current Medications (11/08/2017):  This is the current hospital active medication list Current Facility-Administered Medications  Medication Dose Route Frequency Provider Last Rate Last Dose  . acetaminophen (TYLENOL) tablet 650 mg  650 mg Oral Q6H PRN Clapacs, John T, MD      . alum & mag hydroxide-simeth (MAALOX/MYLANTA) 200-200-20 MG/5ML suspension 30 mL  30 mL Oral Q4H PRN Clapacs, John T, MD      . citalopram (CELEXA) tablet 10 mg  10 mg Oral Daily Clapacs, Madie Reno, MD   10 mg at 11/08/17 7341  . magnesium hydroxide (MILK OF MAGNESIA) suspension 30 mL  30 mL Oral Daily PRN Clapacs, John T, MD      . senna-docusate (Senokot-S) tablet 1 tablet  1 tablet Oral QHS PRN Clapacs, John T, MD      . traZODone (DESYREL) tablet 50 mg  50 mg Oral QHS PRN Clapacs, Madie Reno, MD      . tuberculin injection 5 Units  5 Units Intradermal Once Pucilowska, Jolanta B, MD         Discharge Medications: Please see discharge summary for a list of discharge medications.  Relevant Imaging Results:  Relevant Lab Results:   Additional Information None at this time.   Alden Hipp, LCSW

## 2017-11-08 NOTE — Plan of Care (Addendum)
Patient found awake in bed upon my arrival. Patient is neither visible nor social this evening. Denies SI/HI. Denies AVH. Affect is flat. Denies pain. Denies depression and anxiety. Patient is isolative to room throughout the evening. Reports eating and voiding adequately. CBG 116. Patient has no scheduled HS medications. Denies need for assistance with sleeping. Q 15 minute checks maintained. Will continue to monitor throughout the shift. Patient slept 7.5 hours. No apparent distress. CBG 133. Will endorse care to oncoming shift. Progressing Coping: Ability to cope will improve 11/08/2017 2051 - Progressing by Derek Mound, RN Ability to verbalize feelings will improve 11/08/2017 2051 - Progressing by Derek Mound, RN Education: Emotional status will improve 11/08/2017 2051 - Progressing by Derek Mound, RN Mental status will improve 11/08/2017 2051 - Progressing by Derek Mound, RN   Not Progressing Activity: Interest or engagement in leisure activities will improve 11/08/2017 2051 - Not Progressing by Derek Mound, RN

## 2017-11-08 NOTE — Progress Notes (Signed)
Patient with wrist bands: Yellow for Falls and Purple for DNR when asked what each stands for patient demonstrated understanding of each; "I hope I am not close to my death right now, my dad died at 38 his sisters lived up to their 49's..." remarked jokingly.

## 2017-11-08 NOTE — BHH Group Notes (Signed)
  11/08/2017  Time: 1PM  Type of Therapy/Topic:  Group Therapy:  Emotion Regulation  Participation Level:  Did Not Attend   Description of Group:    The purpose of this group is to assist patients in learning to regulate negative emotions and experience positive emotions. Patients will be guided to discuss ways in which they have been vulnerable to their negative emotions. These vulnerabilities will be juxtaposed with experiences of positive emotions or situations, and patients will be challenged to use positive emotions to combat negative ones. Special emphasis will be placed on coping with negative emotions in conflict situations, and patients will process healthy conflict resolution skills.  Therapeutic Goals: 1. Patient will identify two positive emotions or experiences to reflect on in order to balance out negative emotions 2. Patient will label two or more emotions that they find the most difficult to experience 3. Patient will demonstrate positive conflict resolution skills through discussion and/or role plays  Summary of Patient Progress: Pt was invited to attend group but chose not to attend. CSW will continue to encourage pt to attend group throughout their admission.     Therapeutic Modalities:   Cognitive Behavioral Therapy Feelings Identification Dialectical Behavioral Therapy   Alden Hipp, MSW, LCSW 11/08/2017 1:58 PM

## 2017-11-08 NOTE — Progress Notes (Addendum)
[facility] MD Progress Note  11/08/2017 12:39 PM Lisa Campbell  MRN:  [id number]  Subjective:    Lisa Campbell has no complaints. Her mood is good affect bright. She accepts medication and tolerates them well. There are no somatic complaints. I told the patient that she no longer may stay with her sister. She understood and started looking for other options immediately. She woul consider temporary stay at assisted living facility. She would consider renting independent apartment.   Spoke with the sister who will no longer allow the patient to live with her as this is "too much to handle". Family meeting tomorrow.    Spoke with Lisa [place], case manager on medical floor, who suggested to try their assisted living facility while waiting for independent apartment. There will be a meeting a VOB about the situation. Will know shortly about our options.   Spoke with the facility, Lisa [place] [phone] but she was unable to give me timeframe.   Treatment plan. We will continue Celexa 10 mg daily.  Social/disposition. Patient not allow to return home with the sister. Looking at assisted living facility or independent apartment. Follow up with Lisa Campbell.  Principal Problem: Severe major depression, single episode, without psychotic features ([place]) Diagnosis:   Patient Active Problem List   Diagnosis Date Noted  . Severe major depression, single episode, without psychotic features ([place]) [F32.2] 10/31/2017    Priority: High  . Altered mental status [R41.82] 10/31/2017  . Suicide attempt ([place]) [T14.91XA] 10/31/2017  . HTN (hypertension) [I10] 09/30/2017  . GERD (gastroesophageal reflux disease) [K21.9] 09/30/2017  . Gout [M10.9] 09/30/2017  . Controlled type 2 diabetes mellitus without complication, without long-term current use of insulin ([place]) [E11.9] 09/27/2017  . Vitamin D deficiency [E55.9] 09/27/2017  . History of skin cancer [Z85.828] 09/27/2017   Total Time spent with patient: 30 minutes  Past  Psychiatric History: none  Past Medical History:  Past Medical History:  Diagnosis Date  . Arthritis   . Cancer ([place])    skin mohs left ear ? 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 Colon polyps   . Gastric ulcer   . GERD (gastroesophageal reflux disease)   . Hyperlipidemia   . Hypertension   . Rotator cuff disorder    right  . Shingles     Past Surgical History:  Procedure Laterality Date  . KNEE ARTHROSCOPY     b/l    Family History:  Family History  Problem Relation Age of Onset  . Cancer Mother        ?colon   . Diabetes Mother   . Early death Mother   . Heart disease Mother   . Hypertension Mother   . Hyperlipidemia Mother   . Cancer Father        ? MM  . Diabetes Sister   . Diabetes Brother   . Arthritis Brother    Family Psychiatric  History: none Social History:  Social History   Substance and Sexual Activity  Alcohol Use Yes     Social History   Substance and Sexual Activity  Drug Use No    Social History   Socioeconomic History  . Marital status: Single    Spouse name: None  . Number of children: None  . Years of education: None  . Highest education level: None  Social Needs  . Financial resource strain: None  . Food insecurity - worry: None  . Food insecurity - inability: None  . Transportation  needs - medical: None  . Transportation needs - non-medical: None  Occupational History  . Occupation: retired  Tobacco Use  . Smoking status: Never Smoker  . Smokeless tobacco: Never Used  Substance and Sexual Activity  . Alcohol use: Yes  . Drug use: No  . Sexual activity: No  Other Topics Concern  . None  Social History Narrative   Retired former Lawyer, assoc. Degree    Moved from [place]    Wears seat belts, no guns at home    No exercise    Additional Social History:  Specify valuables returned: clothing                      Sleep: Fair  Appetite:  Fair  Current  Medications: Current Facility-Administered Medications  Medication Dose Route Frequency Provider Last Rate Last Dose  . acetaminophen (TYLENOL) tablet 650 mg  650 mg Oral Q6H PRN Clapacs, John T, MD      . alum & mag hydroxide-simeth (MAALOX/MYLANTA) 200-200-20 MG/5ML suspension 30 mL  30 mL Oral Q4H PRN Clapacs, John T, MD      . citalopram (CELEXA) tablet 10 mg  10 mg Oral Daily Clapacs, Madie Reno, MD   10 mg at 11/08/17 5397  . magnesium hydroxide (MILK OF MAGNESIA) suspension 30 mL  30 mL Oral Daily PRN Clapacs, John T, MD      . senna-docusate (Senokot-S) tablet 1 tablet  1 tablet Oral QHS PRN Clapacs, Madie Reno, MD      . traZODone (DESYREL) tablet 50 mg  50 mg Oral QHS PRN Clapacs, Madie Reno, MD        Lab Results:  Results for orders placed or performed during the hospital encounter of 11/05/17 (from the past 48 hour(s))  Glucose, capillary     Status: Abnormal   Collection Time: 11/06/17  5:48 PM  Result Value Ref Range   Glucose-Capillary 157 (H) 65 - 99 mg/dL  Glucose, capillary     Status: Abnormal   Collection Time: 11/06/17  9:11 PM  Result Value Ref Range   Glucose-Capillary 132 (H) 65 - 99 mg/dL  Glucose, capillary     Status: Abnormal   Collection Time: 11/07/17  6:15 AM  Result Value Ref Range   Glucose-Capillary 115 (H) 65 - 99 mg/dL  Glucose, capillary     Status: None   Collection Time: 11/07/17 11:42 AM  Result Value Ref Range   Glucose-Capillary 84 65 - 99 mg/dL  Glucose, capillary     Status: Abnormal   Collection Time: 11/07/17  4:22 PM  Result Value Ref Range   Glucose-Capillary 115 (H) 65 - 99 mg/dL  Glucose, capillary     Status: Abnormal   Collection Time: 11/07/17  8:16 PM  Result Value Ref Range   Glucose-Capillary 134 (H) 65 - 99 mg/dL    Blood Alcohol level:  No results found for: [facility]  Metabolic Disorder Labs: Lab Results  Component Value Date   HGBA1C 5.4 11/06/2017   MPG 108.28 11/06/2017   No results found for: PROLACTIN Lab Results   Component Value Date   CHOL 239 (H) 11/06/2017   TRIG 221 (H) 11/06/2017   HDL 57 11/06/2017   CHOLHDL 4.2 11/06/2017   VLDL 44 (H) 11/06/2017   LDLCALC 138 (H) 11/06/2017   LDLCALC 157 (H) 09/27/2017    Physical Findings: AIMS: Facial and Oral Movements Muscles of Facial Expression: None, normal Lips and Perioral Area: None,  normal Jaw: None, normal Tongue: None, normal,Extremity Movements Upper (arms, wrists, hands, fingers): None, normal Lower (legs, knees, ankles, toes): None, normal, Trunk Movements Neck, shoulders, hips: None, normal, Overall Severity Severity of abnormal movements (highest score from questions above): None, normal Incapacitation due to abnormal movements: None, normal Patient's awareness of abnormal movements (rate only patient's report): No Awareness, Dental Status Current problems with teeth and/or dentures?: No Does patient usually wear dentures?: No  CIWA:    COWS:     Musculoskeletal: Strength & Muscle Tone: within normal limits Gait & Station: normal Patient leans: N/A  Psychiatric Specialty Exam: Physical Exam  Nursing note and vitals reviewed. Psychiatric: She has a normal mood and affect. Her speech is normal and behavior is normal. Thought content normal. Cognition and memory are normal. She expresses impulsivity.    Review of Systems  Neurological: Negative.   Psychiatric/Behavioral: Negative.   All other systems reviewed and are negative.   Blood pressure (!) 138/56, pulse 79, temperature 97.9 F (36.6 C), resp. rate 16, height 5\' 1"  (1.549 m), weight 80.3 kg (177 lb 0.5 oz), SpO2 98 %.Body mass index is 33.45 kg/m.  General Appearance: Casual  Eye Contact:  Good  Speech:  Clear and Coherent  Volume:  Normal  Mood:  Euthymic  Affect:  Appropriate  Thought Process:  Goal Directed and Descriptions of Associations: Intact  Orientation:  Full (Time, Place, and Person)  Thought Content:  WDL  Suicidal Thoughts:  No  Homicidal  Thoughts:  No  Memory:  Immediate;   Fair Recent;   Fair Remote;   Fair  Judgement:  Impaired  Insight:  Shallow  Psychomotor Activity:  Normal  Concentration:  Concentration: Fair and Attention Span: Fair  Recall:  [employer] of Knowledge:  Fair  Language:  Fair  Akathisia:  No  Handed:  Right  AIMS (if indicated):     Assets:  Communication Skills Desire for Improvement Financial Resources/Insurance Physical Health Resilience Social Support  ADL's:  Intact  Cognition:  WNL  Sleep:  Number of Hours: 7     Treatment Plan Summary: Daily contact with patient to assess and evaluate symptoms and progress in treatment and Medication management   Ms. Pribyl is an 84 year old female with no past psychiatric history transferred from medical floor where she was hospitalized after suicide attempt by medication overdose.The patient who used to live independently relocated to [facility] in January, lives with her sister while awaiting an opening at the [facility]. Unfortunately, she may not return to live with her sister.    #Suicidal ideation, resolved -patient is able to contract for safety in the hospital  #Mood Continue Celexa 10 mg daily  #Diabetes -BSare normal  #Disposition -PPD placed -TBE -follow up withCBC      Orson Slick, MD 11/08/2017, 12:39 PM

## 2017-11-09 LAB — GLUCOSE, CAPILLARY: Glucose-Capillary: 133 mg/dL — ABNORMAL HIGH (ref 65–99)

## 2017-11-09 NOTE — Progress Notes (Signed)
Patient ID: Lisa [place], female   DOB: July 08, 1928, 84 y.o.   MRN: [id number]   CSW received a call from [place] at [facility]). Sharyn Lull stated pt will be placed in room 232 in the [facility]. Sharyn Lull provided the following number for nurse to give report: [phone]. CSW will follow up as needed.   Alden Hipp, MSW, LCSW 11/09/2017 10:01 AM

## 2017-11-09 NOTE — BHH Group Notes (Signed)
  11/09/2017 9am  Type of Therapy and Topic: Group Therapy: Goals Group: SMART Goals   Participation Level: Did Not Attend  Description of Group:    The purpose of a daily goals group is to assist and guide patients in setting recovery/wellness-related goals. The objective is to set goals as they relate to the crisis in which they were admitted. Patients will be using SMART goal modalities to set measurable goals. Characteristics of realistic goals will be discussed and patients will be assisted in setting and processing how one will reach their goal. Facilitator will also assist patients in applying interventions and coping skills learned in psycho-education groups to the SMART goal and process how one will achieve defined goal.   Therapeutic Goals:   -Patients will develop and document one goal related to or their crisis in which brought them into treatment.  -Patients will be guided by LCSW using SMART goal setting modality in how to set a measurable, attainable, realistic and time sensitive goal.  -Patients will process barriers in reaching goal.  -Patients will process interventions in how to overcome and successful in reaching goal.   Patient's Goal: Patient was encouraged and invited to attend group. Patient did not attend group. Social worker will continue to encourage group participation in the future.    Therapeutic Modalities:  Motivational Interviewing  Cognitive Behavioral Therapy  Crisis Intervention Model  SMART goals setting  Darin Engels, [place] 11/09/2017 9:40 AM

## 2017-11-09 NOTE — Progress Notes (Addendum)
  [facility] Adult Case Management Discharge Plan :  Will you be returning to the same living situation after discharge:  No. At discharge, do you have transportation home?: Yes,  pt's sister. Do you have the ability to pay for your medications: Yes,  medicare.  Release of information consent forms completed and in the chart;  Patient's signature needed at discharge.  Patient to Follow up at: Follow-up Information    [facility]. Go on 11/08/2017.   Specialty:  [place] Why:  Please go to [facility] upon discharge from [facility]. Thank you!  Contact information: [place] [phone]       [facility]. Go on 11/13/2017.   Why:  Please go to [facility] for your hospital follow up appointment on Monday, 11/13/17 at [place]. Thank you!  Contact information: [place] [address] [phone]           Next level of care provider has access to [place] and Suicide Prevention discussed: Yes,  with pt and her sister.  Have you used any form of tobacco in the last 30 days? (Cigarettes, Smokeless Tobacco, Cigars, and/or Pipes): No  Has patient been referred to the Quitline?: N/A patient is not a smoker  Patient has been referred for addiction treatment: N/A  Alden Hipp, LCSW 11/09/2017, 9:57 AM

## 2017-11-09 NOTE — Progress Notes (Signed)
Patient ID: Lisa [place], female   DOB: 11/20/1927, 84 y.o.   MRN: [id number]  Discharge Note:  Patient denies SI/HI/AVH at this time. Discharge instructions, AVS, transition record, and prescriptions gone over with patient and family. Patient agrees to comply with medication management, follow-up visit, and outpatient therapy. Patient belongings returned to patient. Patient and family questions and concerns addressed and answered. Patient ambulatory off unit. Patient discharged to [facility] with sister and nephew.

## 2017-11-09 NOTE — Progress Notes (Signed)
Report was given to Hassan Rowan, who is a Web designer, at [employer]. Patient's receiving nurse was away from the nurse's station. This writer left a number for the receiving nurse to call back if any questions or concerns arise. Patient's follow-up information was also given to receiving facility.

## 2017-11-10 DIAGNOSIS — I10 Essential (primary) hypertension: Secondary | ICD-10-CM | POA: Diagnosis not present

## 2017-11-10 DIAGNOSIS — E119 Type 2 diabetes mellitus without complications: Secondary | ICD-10-CM | POA: Diagnosis not present

## 2017-11-10 DIAGNOSIS — F325 Major depressive disorder, single episode, in full remission: Secondary | ICD-10-CM | POA: Diagnosis not present

## 2017-11-13 ENCOUNTER — Ambulatory Visit (INDEPENDENT_AMBULATORY_CARE_PROVIDER_SITE_OTHER): Payer: Medicare Other | Admitting: Licensed Clinical Social Worker

## 2017-11-13 DIAGNOSIS — F322 Major depressive disorder, single episode, severe without psychotic features: Secondary | ICD-10-CM

## 2017-11-13 NOTE — Progress Notes (Signed)
Comprehensive Clinical Assessment (CCA) Note  11/13/2017 Lisa Campbell [id number]  Visit Diagnosis:      ICD-10-CM   1. Severe major depression, single episode, without psychotic features ([place]) F32.2       CCA Part One  Part One has been completed on paper by the patient.  (See scanned document in Chart Review)  CCA Part Two A  Intake/Chief Complaint:  CCA Intake With Chief Complaint CCA Part Two Date: 11/13/17 CCA Part Two Time: 1603 Chief Complaint/Presenting Problem: I moved here from [place] and I became really homesick. I sold everything I owned and moved here Jan 2019.  I am 84 yrs old and my sister lives here.  SHe wants me close to her since I do not have family in [place]. Patients Currently Reported Symptoms/Problems: I was born in [place].  I was feeling sad and I tried to overdose on pills. Reports that she attempted suicide.  Reports that was the first time.  Reports that she isolates herself and does not share her thoughts and feelings with others. Reports that she was unable to sleep.  Reports that she has never been married nor had any children.  Reports that she was in an argument with her sister and she took some pills.  Reports that the hospital will not allow her back into her sister's home.  She reports that she is living at an assisted living and wants to move into her own small apartment. Individual's Strengths: sewing, accounting, genology Individual's Preferences: be back in [place]: communicates Type of Services Patient Feels Are Needed: therapy  Mental Health Symptoms Depression:  Depression: Tearfulness, Sleep (too much or little), Change in energy/activity, Difficulty Concentrating, Fatigue, Hopelessness, Increase/decrease in appetite, Worthlessness  Mania:  Mania: N/A  Anxiety:   Anxiety: N/A  Psychosis:  Psychosis: N/A  Trauma:  Trauma: N/A  Obsessions:  Obsessions: N/A  Compulsions:  Compulsions: N/A  Inattention:   Inattention: N/A  Hyperactivity/Impulsivity:  Hyperactivity/Impulsivity: N/A  Oppositional/Defiant Behaviors:  Oppositional/Defiant Behaviors: N/A  Borderline Personality:  Emotional Irregularity: N/A  Other Mood/Personality Symptoms:      Mental Status Exam Appearance and self-care  Stature:  Stature: Average  Weight:  Weight: Overweight  Clothing:  Clothing: Neat/clean  Grooming:  Grooming: Normal  Cosmetic use:  Cosmetic Use: Age appropriate  Posture/gait:  Posture/Gait: Normal  Motor activity:  Motor Activity: Not Remarkable  Sensorium  Attention:  Attention: Normal  Concentration:  Concentration: Normal  Orientation:  Orientation: X5  Recall/memory:     Affect and Mood  Affect:  Affect: Appropriate  Mood:  Mood: Depressed  Relating  Eye contact:  Eye Contact: Normal  Facial expression:  Facial Expression: Responsive  Attitude toward examiner:  Attitude Toward Examiner: Cooperative  Thought and Language  Speech flow: Speech Flow: Normal  Thought content:  Thought Content: Appropriate to mood and circumstances  Preoccupation:     Hallucinations:     Organization:     Transport planner of Knowledge:  Fund of Knowledge: Average  Intelligence:  Intelligence: Average  Abstraction:  Abstraction: Normal  Judgement:  Judgement: Normal  Reality Testing:  Reality Testing: Adequate  Insight:  Insight: Good  Decision Making:  Decision Making: Normal  Social Functioning  Social Maturity:  Social Maturity: Isolates  Social Judgement:  Social Judgement: Normal  Stress  Stressors:  Stressors: Transitions, Family conflict  Coping Ability:  Coping Ability: English as a second language teacher Deficits:     Supports:  Family and Psychosocial History: Family history Marital status: Single Are you sexually active?: No Does patient have children?: No  Childhood History:  Childhood History By whom was/is the patient raised?: Both parents Additional childhood history information:  Born in [place].  Describes childhood as: great, lived on a farm, daddy was a Building control surveyor, lived on a big farm Description of patient's relationship with caregiver when they were a child: Mother: it was a good relationship, she worked on the farm.  Father: it was a good relationship.  He was from [facility].  He always took care of [place] Patient's description of current relationship with people who raised him/her: Mother: deceased at 39 from heart attack.  Father: deceased at 81 from multimyloma How were you disciplined when you got in trouble as a child/adolescent?: spanking Does patient have siblings?: Yes Number of Siblings: 3(Carbet deceased in his 62s, May 79, Joyce 71) Description of patient's current relationship with siblings: Has a good relationship with May and Blanch Media.  Blanch Media lives in [place] with her autistic son.  Moved to [place] to live with May.   Did patient suffer any verbal/emotional/physical/sexual abuse as a child?: No Did patient suffer from severe childhood neglect?: No Has patient ever been sexually abused/assaulted/raped as an adolescent or adult?: No Was the patient ever a victim of a crime or a disaster?: No Witnessed domestic violence?: No Has patient been effected by domestic violence as an adult?: No  CCA Part Two B  Employment/Work Situation: Employment / Work Copywriter, advertising Employment situation: Retired(Retired in 1982) Patient's job has been impacted by current illness: No What is the longest time patient has a held a job?: unknown 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 [place] patient ever been in the [employer]?: No  Education: Education Name of [place]: [employer] in [place] Did You Graduate From [employer]?: Yes Did Physicist, medical?: Yes What Type of College Degree Do you Have?: Associates([employer]) What Was Your Major?: Accounting Did You Have An Individualized Education Program (IIEP): No Did You Have Any  Difficulty At [employer]?: No  Religion: Religion/Spirituality Are You A Religious Person?: No  Leisure/Recreation: Leisure / Recreation Leisure and Hobbies: walking, sewing, cooking, looking at family history, poetry  Exercise/Diet: Exercise/Diet Do You Exercise?: Yes What Type of Exercise Do You Do?: Run/Walk How Many Times a Week Do You Exercise?: 4-5 times a week Have You Gained or Lost A Significant Amount of Weight in the Past Six Months?: No Do You Follow a Special Diet?: No Do You Have Any Trouble Sleeping?: Yes Explanation of Sleeping Difficulties: difficulty falling asleep  CCA Part Two C  Alcohol/Drug Use: Alcohol / Drug Use Pain Medications: denies Prescriptions: "Don't know the name of it but it is a tiny pink pill for depression." Over the Counter: Vitamin C History of alcohol / drug use?: Yes Substance #1 Name of Substance 1: Alcohol 1 - Age of First Use: 20 1 - Amount (size/oz): 2-3 ounces of brandy 1 - Frequency: daily 1 - Duration: about 5 years 1 - Last Use / Amount: last night 2-3 ounces of Brandy                    CCA Part Three  ASAM's:  Six Dimensions of Multidimensional Assessment  Dimension 1:  Acute Intoxication and/or Withdrawal Potential:     Dimension 2:  Biomedical Conditions and Complications:     Dimension 3:  Emotional, Behavioral,  or Cognitive Conditions and Complications:     Dimension 4:  Readiness to Change:     Dimension 5:  Relapse, Continued use, or Continued Problem Potential:     Dimension 6:  Recovery/Living Environment:      Substance use Disorder (SUD)    Social Function:  Social Functioning Social Maturity: Isolates Social Judgement: Normal  Stress:  Stress Stressors: Transitions, Family conflict Coping Ability: Overwhelmed Patient Takes Medications The Way The Doctor Instructed?: Yes Priority Risk: Low Acuity  Risk Assessment- Self-Harm Potential: Risk Assessment For Self-Harm Potential Thoughts of  Self-Harm: No current thoughts Method: No plan  Risk Assessment -Dangerous to Others Potential: Risk Assessment For Dangerous to Others Potential Method: No Plan Availability of Means: No access or NA Intent: Vague intent or NA Notification Required: No need or identified person  DSM5 Diagnoses: Patient Active Problem List   Diagnosis Date Noted  . Altered mental status 10/31/2017  . Suicide attempt ([place]) 10/31/2017  . Severe major depression, single episode, without psychotic features ([place]) 10/31/2017  . HTN (hypertension) 09/30/2017  . GERD (gastroesophageal reflux disease) 09/30/2017  . Gout 09/30/2017  . Controlled type 2 diabetes mellitus without complication, without long-term current use of insulin ([place]) 09/27/2017  . Vitamin D deficiency 09/27/2017  . History of skin cancer 09/27/2017    Patient Centered Plan: Patient is on the following Treatment Plan(s):  Depression  Recommendations for Services/Supports/Treatments: Recommendations for Services/Supports/Treatments Recommendations For Services/Supports/Treatments: Individual Therapy, Medication Management  Treatment Plan Summary:    Referrals to Alternative Service(s): Referred to Alternative Service(s):   Place:   Date:   Time:    Referred to Alternative Service(s):   Place:   Date:   Time:    Referred to Alternative Service(s):   Place:   Date:   Time:    Referred to Alternative Service(s):   Place:   Date:   Time:     [place]

## 2017-11-20 ENCOUNTER — Encounter
Admission: RE | Admit: 2017-11-20 | Discharge: 2017-11-20 | Disposition: A | Discharge status: Home / Self Care | Payer: Medicare Other | Source: Ambulatory Visit | Attending: Internal Medicine | Admitting: Internal Medicine

## 2017-11-28 ENCOUNTER — Non-Acute Institutional Stay: Payer: Medicare Other | Admitting: Gerontology

## 2017-11-28 ENCOUNTER — Encounter: Payer: Self-pay | Admitting: Gerontology

## 2017-11-28 DIAGNOSIS — I10 Essential (primary) hypertension: Secondary | ICD-10-CM

## 2017-11-28 DIAGNOSIS — E119 Type 2 diabetes mellitus without complications: Secondary | ICD-10-CM

## 2017-11-28 DIAGNOSIS — K219 Gastro-esophageal reflux disease without esophagitis: Secondary | ICD-10-CM

## 2017-11-28 NOTE — Progress Notes (Signed)
Location:    Nursing Home Room Number: 355 Place of Service:  ALF [phone]) Provider:  Toni Arthurs, NP-C  McLean-Scocuzza, Lisa Glow, MD  Patient Care Team: McLean-Scocuzza, Lisa Glow, MD as PCP - General (Internal Medicine)  Extended Emergency Contact Information Primary Emergency Contact: northrup,mae Mobile Phone: [phone] Relation: Sister  Code Status:  DNR Goals of care: Advanced Directive information Advanced Directives 11/28/2017  Does Patient Have a Medical Advance Directive? Yes  Type of Advance Directive Out of facility DNR (pink MOST or yellow form)  Does patient want to make changes to medical advance directive? No - Patient declined  Would patient like information on creating a medical advance directive? -  Some encounter information is confidential and restricted. Go to Review Flowsheets activity to see all data.     Chief Complaint  Patient presents with  . Medical Management of Chronic Issues    Routine Visit    HPI:  Pt is a 84 y.o. female seen today for medical management of chronic diseases.    HTN (hypertension) Stable. No episodes of hyper or hypotension. Denies chest pain or shortness of breath. Only on Torsemide 5 mg Q Day for HTN  GERD (gastroesophageal reflux disease) Stable. No c/o exacerbation of symptoms. Not on daily acid suppression medications.   Controlled type 2 diabetes mellitus without complication, without long-term current use of insulin (HCC) Stable. Not on any oral diabetes medications. Daily BG checks and prn. No recent episodes of hyper or hypoglycemia    Past Medical History:  Diagnosis Date  . Arthritis   . Cancer ([place])    skin mohs left ear ? 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 Colon polyps   . Gastric ulcer   . GERD (gastroesophageal reflux disease)   . Hyperlipidemia   . Hypertension   . Rotator cuff disorder    right  . Shingles    Past Surgical History:  Procedure Laterality Date  . KNEE  ARTHROSCOPY     b/l     Allergies  Allergen Reactions  . Penicillins     ?rxn   . Statins     ? Rxn cant tolerate crestor and lipitor noted prev records.     Allergies as of 11/28/2017      Reactions   Penicillins    ?rxn    Statins    ? Rxn cant tolerate crestor and lipitor noted prev records.       Medication List        Accurate as of 11/28/17  5:08 PM. Always use your most recent med list.          citalopram 10 MG tablet Commonly known as:  CELEXA Take 1 tablet (10 mg total) by mouth daily.   torsemide 5 MG tablet Commonly known as:  DEMADEX Take 5 mg by mouth daily.   traZODone 50 MG tablet Commonly known as:  DESYREL Take 1 tablet (50 mg total) by mouth at bedtime as needed for sleep.   Vitamin D3 2000 units Tabs Take 2,000 Units by mouth daily.       Review of Systems  Constitutional: Negative for activity change, appetite change, chills, diaphoresis and fever.  HENT: Negative for congestion, mouth sores, nosebleeds, postnasal drip, sneezing, sore throat, trouble swallowing and voice change.   Respiratory: Negative for apnea, cough, choking, chest tightness, shortness of breath and wheezing.   Cardiovascular: Negative for chest pain, palpitations and leg swelling.  Gastrointestinal: Negative  for abdominal distention, abdominal pain, constipation, diarrhea and nausea.  Genitourinary: Negative for difficulty urinating, dysuria, frequency and urgency.  Musculoskeletal: Positive for arthralgias (typical arthritis). Negative for back pain, gait problem and myalgias.  Skin: Negative for color change, pallor, rash and wound.  Neurological: Negative for dizziness, tremors, syncope, speech difficulty, weakness, numbness and headaches.  Psychiatric/Behavioral: Negative for agitation and behavioral problems.  All other systems reviewed and are negative.   Immunization History  Administered Date(s) Administered  . Influenza-Unspecified 04/27/2017   Pertinent   Health Maintenance Due  Topic Date Due  . FOOT EXAM  01/01/1938  . OPHTHALMOLOGY EXAM  01/01/1938  . DEXA SCAN  01/01/1995  . PNA vac Low Risk Adult (1 of 2 - PCV13) 01/01/1995  . INFLUENZA VACCINE  03/22/2018  . HEMOGLOBIN A1C  05/09/2018  . URINE MICROALBUMIN  09/27/2018   Fall Risk  09/27/2017  Falls in the past year? No   Functional Status Survey:    Vitals:   11/28/17 1005  BP: (!) 148/64  Pulse: 70   There is no height or weight on file to calculate BMI. Physical Exam  Constitutional: She is oriented to person, place, and time. Vital signs are normal. She appears well-developed and well-nourished. She is active and cooperative. She does not appear ill. No distress.  HENT:  Head: Normocephalic and atraumatic.  Mouth/Throat: Uvula is midline, oropharynx is clear and moist and mucous membranes are normal. Mucous membranes are not pale, not dry and not cyanotic.  Eyes: Pupils are equal, round, and reactive to light. Conjunctivae, EOM and lids are normal.  Neck: Trachea normal, normal range of motion and full passive range of motion without pain. Neck supple. No JVD present. No tracheal deviation, no edema and no erythema present. No thyromegaly present.  Cardiovascular: Normal rate, regular rhythm, intact distal pulses and normal pulses. Exam reveals no gallop, no distant heart sounds and no friction rub.  Murmur heard. Pulses:      Dorsalis pedis pulses are 2+ on the right side, and 2+ on the left side.  1+ BLE pitting edema  Pulmonary/Chest: Effort normal and breath sounds normal. No accessory muscle usage. No respiratory distress. She has no decreased breath sounds. She has no wheezes. She has no rhonchi. She has no rales. She exhibits no tenderness.  Abdominal: Soft. Normal appearance and bowel sounds are normal. She exhibits no distension and no ascites. There is no tenderness.  Musculoskeletal: Normal range of motion. She exhibits no edema or tenderness.  Expected  osteoarthritis, stiffness; Bilateral Calves soft, supple. Negative Homan's Sign. B- pedal pulses equal  Neurological: She is alert and oriented to person, place, and time. She has normal strength.  Skin: Skin is warm, dry and intact. She is not diaphoretic. No cyanosis. No pallor. Nails show no clubbing.  Psychiatric: She has a normal mood and affect. Her speech is normal and behavior is normal. Judgment and thought content normal. Cognition and memory are impaired.  Nursing note and vitals reviewed.   Labs reviewed: Recent Labs    10/31/17 1440 11/01/17 0556  11/03/17 0517 11/04/17 0647 11/05/17 1144  NA 139 142   < > 142 141 140  K 3.9 5.3*   < > 3.6 3.6 4.8  CL 105 106   < > 99* 104 106  CO2 20* 14*   < > _0 GLUCOSE 167* 134*   < > 126* 130* 162*  BUN 16 21*   < > 33* 22* 22*  CREATININE 1.36* 1.70*   < > 1.86* 1.34* 1.20*  CALCIUM 8.8* 8.3*   < > 8.6* 8.5* 8.8*  MG 1.2* 2.2  --   --   --   --   PHOS  --   --   --  2.8  --   --    < > = values in this interval not displayed.   Recent Labs    09/27/17 1148 10/31/17 1440 11/03/17 0517  AST 18 30  --   ALT 14 13*  --   ALKPHOS 53 59  --   BILITOT 1.0 1.9*  --   PROT 6.8 6.6  --   ALBUMIN 3.9 3.7 3.5   Recent Labs    09/27/17 1148 10/31/17 1440 11/01/17 0556  WBC 8.3 12.3* 13.4*  NEUTROABS 5.6 10.3*  --   HGB 12.2 11.9* 12.3  HCT 36.2 35.6 37.9  MCV 97.5 96.8 99.1  PLT 323.0 327 296   Lab Results  Component Value Date   TSH 3.808 11/06/2017   Lab Results  Component Value Date   HGBA1C 5.4 11/06/2017   Lab Results  Component Value Date   CHOL 239 (H) 11/06/2017   HDL 57 11/06/2017   LDLCALC 138 (H) 11/06/2017   TRIG 221 (H) 11/06/2017   CHOLHDL 4.2 11/06/2017    Significant Diagnostic Results in last 30 days:  Dg Chest 2 View  Result Date: 11/08/2017 CLINICAL DATA:  Evaluate for tuberculosis EXAM: CHEST - 2 VIEW COMPARISON:  10/31/2017 FINDINGS: The heart size and mediastinal contours are  within normal limits. Both lungs are clear. The visualized skeletal structures are unremarkable. IMPRESSION: No active cardiopulmonary disease. Electronically Signed   By: Inez Catalina M.D.   On: 11/08/2017 16:29   Dg Chest Portable 1 View  Result Date: 10/31/2017 CLINICAL DATA:  Evaluate for aspiration. EXAM: PORTABLE CHEST 1 VIEW COMPARISON:  None. FINDINGS: The heart size and mediastinal contours are within normal limits. Both lungs are clear. The visualized skeletal structures are unremarkable. IMPRESSION: No active disease. Electronically Signed   By: Titus Dubin M.D.   On: 10/31/2017 14:44   Ct Head Code Stroke Wo Contrast  Result Date: 10/31/2017 CLINICAL DATA:  Code stroke. Altered level of consciousness. Code stroke. Slurred speech. Drug overdose. EXAM: CT HEAD WITHOUT CONTRAST TECHNIQUE: Contiguous axial images were obtained from the base of the skull through the vertex without intravenous contrast. COMPARISON:  None. FINDINGS: Brain: Mild atrophy and white matter changes are present bilaterally. Basal ganglia are intact. Insular ribbon is normal bilaterally. No acute infarct, hemorrhage, or mass lesion is present. The ventricles are proportionate to the degree of atrophy. No significant extra-axial fluid collection is present. Vascular: Atherosclerotic calcifications are present in the cavernous internal carotid arteries bilaterally. There is no hyperdense vessel. Skull: Calvarium is intact. No focal lytic or blastic lesions are present. Sinuses/Orbits: Paranasal sinuses and mastoid air cells are clear. Bilateral lens replacements are present. Globes and orbits are within normal limits. ASPECTS [facility] Stroke Program Early CT Score) - Ganglionic level infarction (caudate, lentiform nuclei, internal capsule, insula, M1-M3 cortex): 7/7 - Supraganglionic infarction (M4-M6 cortex): 3/3 Total score (0-10 with 10 being normal): 10/10 IMPRESSION: 1. No acute intracranial abnormality. 2. Mild atrophy  and white matter disease likely reflects the sequela of chronic microvascular ischemia. 3. ASPECTS is 10/10 These results were called by telephone at the time of interpretation on 10/31/2017 at 3:24 pm to Dr. Merlyn Lot , who verbally acknowledged these results. Electronically Signed  By: San Morelle M.D.   On: 10/31/2017 15:25    Assessment/Plan Lisa Campbell was seen today for medical management of chronic issues.  Diagnoses and all orders for this visit:  Essential hypertension  Gastroesophageal reflux disease, esophagitis presence not specified  Controlled type 2 diabetes mellitus without complication, without long-term current use of insulin ([place])   above listed conditions stable  Continue current medication regimen  Pt is new admit to ALF  Obtain baseline labs  Safety monitoring  Monitor for hypotension and hypoglycemia  Family/ staff Communication:   Total Time:  Documentation:  Face to Face:  Family/Phone:   Labs/tests ordered:  Cbc, met c, mag+, tsh, b12, D, lipid panel, A1C for baseline  Medication list reviewed and assessed for continued appropriateness. Monthly medication orders reviewed and signed.  Lisa Ports, NP-C Geriatrics [facility] [phone] [place],  [address] Cell Phone (Mon-Fri 8am-5pm):  [phone] On Call:  [phone] & follow prompts after 5pm & weekends Office Phone:  [phone] Office Fax:  [phone]

## 2017-11-28 NOTE — Assessment & Plan Note (Signed)
Stable. No episodes of hyper or hypotension. Denies chest pain or shortness of breath. Only on Torsemide 5 mg Q Day for HTN

## 2017-11-28 NOTE — Assessment & Plan Note (Signed)
Stable. Not on any oral diabetes medications. Daily BG checks and prn. No recent episodes of hyper or hypoglycemia

## 2017-11-28 NOTE — Assessment & Plan Note (Signed)
Stable. No c/o exacerbation of symptoms. Not on daily acid suppression medications.

## 2017-11-30 ENCOUNTER — Other Ambulatory Visit
Admission: RE | Admit: 2017-11-30 | Discharge: 2017-11-30 | Disposition: A | Discharge status: Home / Self Care | Payer: Medicare Other | Source: Skilled Nursing Facility | Attending: Internal Medicine | Admitting: Internal Medicine

## 2017-11-30 DIAGNOSIS — I10 Essential (primary) hypertension: Secondary | ICD-10-CM | POA: Diagnosis not present

## 2017-11-30 LAB — COMPREHENSIVE METABOLIC PANEL
ALBUMIN: 3.3 g/dL — AB (ref 3.5–5.0)
ALK PHOS: 55 U/L (ref 38–126)
ALT: 10 U/L — ABNORMAL LOW (ref 14–54)
ANION GAP: 8 (ref 5–15)
AST: 21 U/L (ref 15–41)
BILIRUBIN TOTAL: 0.9 mg/dL (ref 0.3–1.2)
BUN: 19 mg/dL (ref 6–20)
CALCIUM: 9.4 mg/dL (ref 8.9–10.3)
CO2: 30 mmol/L (ref 22–32)
Chloride: 102 mmol/L (ref 101–111)
Creatinine, Ser: 1.18 mg/dL — ABNORMAL HIGH (ref 0.44–1.00)
GFR, EST AFRICAN AMERICAN: 47 mL/min — AB (ref >60)
GFR, EST NON AFRICAN AMERICAN: 40 mL/min — AB (ref >60)
GLUCOSE: 108 mg/dL — AB (ref 65–99)
POTASSIUM: 3.6 mmol/L (ref 3.5–5.1)
Sodium: 140 mmol/L (ref 135–145)
TOTAL PROTEIN: 6.3 g/dL — AB (ref 6.5–8.1)

## 2017-11-30 LAB — LIPID PANEL
CHOLESTEROL: 243 mg/dL — AB (ref 0–200)
HDL: 61 mg/dL (ref >40)
LDL Cholesterol: 153 mg/dL — ABNORMAL HIGH (ref 0–99)
TRIGLYCERIDES: 145 mg/dL (ref <150)
Total CHOL/HDL Ratio: 4 RATIO
VLDL: 29 mg/dL (ref 0–40)

## 2017-11-30 LAB — CBC WITH DIFFERENTIAL/PLATELET
BASOS PCT: 1 %
Basophils Absolute: 0.1 10*3/uL (ref 0–0.1)
Eosinophils Absolute: 0.3 10*3/uL (ref 0–0.7)
Eosinophils Relative: 4 %
HEMATOCRIT: 33.3 % — AB (ref 35.0–47.0)
Hemoglobin: 11.2 g/dL — ABNORMAL LOW (ref 12.0–16.0)
LYMPHS ABS: 1.3 10*3/uL (ref 1.0–3.6)
Lymphocytes Relative: 17 %
MCH: 32.9 pg (ref 26.0–34.0)
MCHC: 33.7 g/dL (ref 32.0–36.0)
MCV: 97.6 fL (ref 80.0–100.0)
MONOS PCT: 9 %
Monocytes Absolute: 0.7 10*3/uL (ref 0.2–0.9)
NEUTROS ABS: 5.3 10*3/uL (ref 1.4–6.5)
Neutrophils Relative %: 69 %
Platelets: 300 10*3/uL (ref 150–440)
RBC: 3.41 MIL/uL — ABNORMAL LOW (ref 3.80–5.20)
RDW: 13.6 % (ref 11.5–14.5)
WBC: 7.7 10*3/uL (ref 3.6–11.0)

## 2017-11-30 LAB — VITAMIN B12: Vitamin B-12: 382 pg/mL (ref 180–914)

## 2017-11-30 LAB — TSH: TSH: 2.763 u[IU]/mL (ref 0.350–4.500)

## 2017-11-30 LAB — MAGNESIUM: MAGNESIUM: 1.4 mg/dL — AB (ref 1.7–2.4)

## 2017-12-01 LAB — VITAMIN D 25 HYDROXY (VIT D DEFICIENCY, FRACTURES): Vit D, 25-Hydroxy: 37.1 ng/mL (ref 30.0–100.0)

## 2017-12-11 ENCOUNTER — Ambulatory Visit: Payer: Medicare Other | Admitting: Licensed Clinical Social Worker

## 2017-12-13 ENCOUNTER — Ambulatory Visit: Payer: Medicare Other | Admitting: Psychiatry

## 2017-12-20 ENCOUNTER — Encounter
Admission: RE | Admit: 2017-12-20 | Discharge: 2017-12-20 | Disposition: A | Discharge status: Home / Self Care | Payer: Medicare Other | Source: Ambulatory Visit | Attending: Internal Medicine | Admitting: Internal Medicine

## 2018-01-04 ENCOUNTER — Encounter: Payer: Self-pay | Admitting: Gerontology

## 2018-01-04 ENCOUNTER — Non-Acute Institutional Stay: Payer: Medicare Other | Admitting: Gerontology

## 2018-01-04 DIAGNOSIS — F322 Major depressive disorder, single episode, severe without psychotic features: Secondary | ICD-10-CM | POA: Diagnosis not present

## 2018-01-04 DIAGNOSIS — D2271 Melanocytic nevi of right lower limb, including hip: Secondary | ICD-10-CM | POA: Diagnosis not present

## 2018-01-04 DIAGNOSIS — E559 Vitamin D deficiency, unspecified: Secondary | ICD-10-CM

## 2018-01-04 DIAGNOSIS — R079 Chest pain, unspecified: Secondary | ICD-10-CM | POA: Diagnosis not present

## 2018-01-04 DIAGNOSIS — R0781 Pleurodynia: Secondary | ICD-10-CM | POA: Diagnosis not present

## 2018-01-04 DIAGNOSIS — D2272 Melanocytic nevi of left lower limb, including hip: Secondary | ICD-10-CM | POA: Diagnosis not present

## 2018-01-04 DIAGNOSIS — X32XXXA Exposure to sunlight, initial encounter: Secondary | ICD-10-CM | POA: Diagnosis not present

## 2018-01-04 DIAGNOSIS — D225 Melanocytic nevi of trunk: Secondary | ICD-10-CM | POA: Diagnosis not present

## 2018-01-04 DIAGNOSIS — L570 Actinic keratosis: Secondary | ICD-10-CM | POA: Diagnosis not present

## 2018-01-04 DIAGNOSIS — D2261 Melanocytic nevi of right upper limb, including shoulder: Secondary | ICD-10-CM | POA: Diagnosis not present

## 2018-01-04 DIAGNOSIS — D2262 Melanocytic nevi of left upper limb, including shoulder: Secondary | ICD-10-CM | POA: Diagnosis not present

## 2018-01-05 DIAGNOSIS — H02033 Senile entropion of right eye, unspecified eyelid: Secondary | ICD-10-CM | POA: Diagnosis not present

## 2018-01-08 DIAGNOSIS — R0781 Pleurodynia: Secondary | ICD-10-CM | POA: Insufficient documentation

## 2018-01-08 NOTE — Assessment & Plan Note (Signed)
Recent fall onto right side. Resolving bruise on right side/ flank area. Pt reports ongoing/perisistent pain on the right ribs. No cardiac chest pain or shortness of breath. Pt would like to try Lidocaine patch for comfort.

## 2018-01-08 NOTE — Assessment & Plan Note (Signed)
Stable. Recent lab 37.1. On Cholecalciferol 2,000 units po Q Day

## 2018-01-08 NOTE — Progress Notes (Signed)
Location:    Nursing Home Room Number: 284 Place of Service:  ALF [phone]) Provider:  Toni Arthurs, NP-C  McLean-Scocuzza, Nino Glow, MD  Patient Care Team: McLean-Scocuzza, Nino Glow, MD as PCP - General (Internal Medicine)  Extended Emergency Contact Information Primary Emergency Contact: northrup,mae Mobile Phone: [phone] Relation: Sister  Code Status:  DNR Goals of care: Advanced Directive information Advanced Directives 01/04/2018  Does Patient Have a Medical Advance Directive? Yes  Type of Advance Directive Out of facility DNR (pink MOST or yellow form)  Does patient want to make changes to medical advance directive? No - Patient declined  Would patient like information on creating a medical advance directive? -  Some encounter information is confidential and restricted. Go to Review Flowsheets activity to see all data.     Chief Complaint  Patient presents with  . Medical Management of Chronic Issues    Routine Visit    HPI:  Pt is a 84 y.o. female seen today for medical management of chronic diseases.    Vitamin D deficiency Stable. Recent lab 37.1. On Cholecalciferol 2,000 units po Q Day  Rib pain on right side Recent fall onto right side. Resolving bruise on right side/ flank area. Pt reports ongoing/perisistent pain on the right ribs. No cardiac chest pain or shortness of breath. Pt would like to try Lidocaine patch for comfort.  Severe major depression, single episode, without psychotic features ([place]) Stable. Pt on Celexa 10 mg po Q Day and Trazodone 50 mg po Q HS. No evidence of worsening depression. Pt reports she is feeling well and adjusting well to new environment. Denies SI/HI.     Past Medical History:  Diagnosis Date  . Arthritis   . Cancer ([place])    skin mohs left ear ? 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 Colon polyps   . Gastric ulcer   . GERD (gastroesophageal reflux disease)   . Hyperlipidemia   . Hypertension   .  Rotator cuff disorder    right  . Shingles    Past Surgical History:  Procedure Laterality Date  . KNEE ARTHROSCOPY     b/l     Allergies  Allergen Reactions  . Penicillins     ?rxn   . Statins     ? Rxn cant tolerate crestor and lipitor noted prev records.     Allergies as of 01/04/2018      Reactions   Penicillins    ?rxn    Statins    ? Rxn cant tolerate crestor and lipitor noted prev records.       Medication List        Accurate as of 01/04/18 11:59 PM. Always use your most recent med list.          acetaminophen 325 MG tablet Commonly known as:  TYLENOL Take 650 mg by mouth every 4 (four) hours as needed. Every 4 hours as needed prn for pain and/or increased temp. May be administered orally (via gastric tube if present), or rectally - if patient unable to take orally.   citalopram 10 MG tablet Commonly known as:  CELEXA Take 1 tablet (10 mg total) by mouth daily.   cyanocobalamin 1000 MCG tablet Take 1,000 mcg by mouth daily.   ipratropium 0.03 % nasal spray Commonly known as:  ATROVENT Place 2 sprays into both nostrils 2 (two) times daily.   Lidocaine 4 % Ptch Apply 1 patch topically daily. Right Ribs/  Flank. Remove after 12 hours   Magnesium Oxide 250 MG Tabs Take 1 tablet by mouth daily.   torsemide 5 MG tablet Commonly known as:  DEMADEX Take 5 mg by mouth daily.   traZODone 50 MG tablet Commonly known as:  DESYREL Take 50 mg by mouth at bedtime.   Vitamin D3 2000 units Tabs Take 2,000 Units by mouth daily.       Review of Systems  Constitutional: Negative for activity change, appetite change, chills, diaphoresis and fever.  HENT: Negative for congestion, mouth sores, nosebleeds, postnasal drip, sneezing, sore throat, trouble swallowing and voice change.   Respiratory: Negative for apnea, cough, choking, chest tightness, shortness of breath and wheezing.   Cardiovascular: Negative for chest pain, palpitations and leg swelling.    Gastrointestinal: Negative for abdominal distention, abdominal pain, constipation, diarrhea and nausea.  Genitourinary: Negative for difficulty urinating, dysuria, frequency and urgency.  Musculoskeletal: Positive for arthralgias (typical arthritis). Negative for back pain, gait problem and myalgias.  Skin: Positive for color change. Negative for pallor, rash and wound.  Neurological: Positive for weakness. Negative for dizziness, tremors, syncope, speech difficulty, numbness and headaches.  Psychiatric/Behavioral: Positive for dysphoric mood. Negative for agitation and behavioral problems.  All other systems reviewed and are negative.   Immunization History  Administered Date(s) Administered  . Influenza-Unspecified 04/27/2017   Pertinent  Health Maintenance Due  Topic Date Due  . FOOT EXAM  01/01/1938  . OPHTHALMOLOGY EXAM  01/01/1938  . DEXA SCAN  01/01/1995  . PNA vac Low Risk Adult (1 of 2 - PCV13) 01/01/1995  . INFLUENZA VACCINE  03/22/2018  . HEMOGLOBIN A1C  05/09/2018  . URINE MICROALBUMIN  09/27/2018   Fall Risk  09/27/2017  Falls in the past year? No   Functional Status Survey:    Vitals:   01/04/18 1358  BP: (!) 152/76  Pulse: 71  Resp: 18  Temp: (!) 97.2 F (36.2 C)  TempSrc: Oral  SpO2: 99%  Weight: 184 lb 9.6 oz (83.7 kg)  Height: 5\' 1"  (1.549 m)   Body mass index is 34.88 kg/m. Physical Exam  Constitutional: She is oriented to person, place, and time. Vital signs are normal. She appears well-developed and well-nourished. She is active and cooperative. She does not appear ill. No distress.  HENT:  Head: Normocephalic and atraumatic.  Mouth/Throat: Uvula is midline, oropharynx is clear and moist and mucous membranes are normal. Mucous membranes are not pale, not dry and not cyanotic.  Eyes: Pupils are equal, round, and reactive to light. Conjunctivae, EOM and lids are normal.  Neck: Trachea normal, normal range of motion and full passive range of motion  without pain. Neck supple. No JVD present. No tracheal deviation, no edema and no erythema present. No thyromegaly present.  Cardiovascular: Normal rate, regular rhythm, normal heart sounds, intact distal pulses and normal pulses. Exam reveals no gallop, no distant heart sounds and no friction rub.  No murmur heard. Pulses:      Dorsalis pedis pulses are 2+ on the right side, and 2+ on the left side.  No edema  Pulmonary/Chest: Effort normal and breath sounds normal. No accessory muscle usage. No respiratory distress. She has no decreased breath sounds. She has no wheezes. She has no rhonchi. She has no rales. She exhibits no tenderness.  Abdominal: Soft. Normal appearance and bowel sounds are normal. She exhibits no distension and no ascites. There is no tenderness.  Musculoskeletal: Normal range of motion. She exhibits no edema or tenderness.  Expected osteoarthritis, stiffness; Bilateral Calves soft, supple. Negative Homan's Sign. B- pedal pulses equal; Right ribs tender  Neurological: She is alert and oriented to person, place, and time. She has normal strength.  Skin: Skin is warm, dry and intact. Bruising (resolving- over the right ribs/flank) noted. She is not diaphoretic. No cyanosis. No pallor. Nails show no clubbing.  Psychiatric: Her speech is normal and behavior is normal. Thought content normal. Her affect is blunt. Cognition and memory are impaired. She expresses impulsivity. She exhibits a depressed mood.  Nursing note and vitals reviewed.   Labs reviewed: Recent Labs    10/31/17 1440 11/01/17 0556  11/03/17 0517 11/04/17 0647 11/05/17 1144 11/30/17 0530  NA 139 142   < > 142 141 140 140  K 3.9 5.3*   < > 3.6 3.6 4.8 3.6  CL 105 106   < > 99* 104 106 102  CO2 20* 14*   < > 31 28 25 30   GLUCOSE 167* 134*   < > 126* 130* 162* 108*  BUN 16 21*   < > 33* 22* 22* 19  CREATININE 1.36* 1.70*   < > 1.86* 1.34* 1.20* 1.18*  CALCIUM 8.8* 8.3*   < > 8.6* 8.5* 8.8* 9.4  MG 1.2*  2.2  --   --   --   --  1.4*  PHOS  --   --   --  2.8  --   --   --    < > = values in this interval not displayed.   Recent Labs    09/27/17 1148 10/31/17 1440 11/03/17 0517 11/30/17 0530  AST 18 30  --  21  ALT 14 13*  --  10*  ALKPHOS 53 59  --  55  BILITOT 1.0 1.9*  --  0.9  PROT 6.8 6.6  --  6.3*  ALBUMIN 3.9 3.7 3.5 3.3*   Recent Labs    09/27/17 1148 10/31/17 1440 11/01/17 0556 11/30/17 0530  WBC 8.3 12.3* 13.4* 7.7  NEUTROABS 5.6 10.3*  --  5.3  HGB 12.2 11.9* 12.3 11.2*  HCT 36.2 35.6 37.9 33.3*  MCV 97.5 96.8 99.1 97.6  PLT 323.0 327 296 300   Lab Results  Component Value Date   TSH 2.763 11/30/2017   Lab Results  Component Value Date   HGBA1C 5.4 11/06/2017   Lab Results  Component Value Date   CHOL 243 (H) 11/30/2017   HDL 61 11/30/2017   LDLCALC 153 (H) 11/30/2017   TRIG 145 11/30/2017   CHOLHDL 4.0 11/30/2017    Significant Diagnostic Results in last 30 days:  No results found.  Assessment/Plan Yamilee was seen today for medical management of chronic issues.  Diagnoses and all orders for this visit:  Vitamin D deficiency  Rib pain on right side  Severe major depression, single episode, without psychotic features ([place])   Above listed conditions stable except rib pain- but is resolving  Continue current medication regimen, except  Add Aspercreme 4% (Lidocaine) patch to the right ribs once daily, remove after 12 hours  Safety precautions  Fall precautions  Monitor for worsening s/s of depression  Monitor for worsening s/s of pain, bruising, etc  Continue to encourage participation in activities with other residents and participation in activities  Family/ staff Communication:   Total Time:  Documentation:  Face to Face:  Family/Phone:   Labs/tests ordered:  Not due. Recent labs reviewed- stable  Medication list reviewed and assessed for continued appropriateness. Monthly  medication orders reviewed and  signed.  Vikki Ports, NP-C Geriatrics [facility] [phone], [place] [address] Cell Phone (Mon-Fri 8am-5pm):  [phone] On Call:  [phone] & follow prompts after 5pm & weekends Office Phone:  [phone] Office Fax:  [phone]

## 2018-01-08 NOTE — Assessment & Plan Note (Signed)
Stable. Pt on Celexa 10 mg po Q Day and Trazodone 50 mg po Q HS. No evidence of worsening depression. Pt reports she is feeling well and adjusting well to new environment. Denies SI/HI.

## 2018-01-17 DIAGNOSIS — H02032 Senile entropion of right lower eyelid: Secondary | ICD-10-CM | POA: Diagnosis not present

## 2018-01-20 ENCOUNTER — Encounter
Admission: RE | Admit: 2018-01-20 | Discharge: 2018-01-20 | Disposition: A | Discharge status: Home / Self Care | Payer: Medicare Other | Source: Ambulatory Visit | Attending: Internal Medicine | Admitting: Internal Medicine

## 2018-01-25 ENCOUNTER — Encounter: Payer: Self-pay | Admitting: Internal Medicine

## 2018-01-25 ENCOUNTER — Ambulatory Visit (INDEPENDENT_AMBULATORY_CARE_PROVIDER_SITE_OTHER): Payer: Medicare Other | Admitting: Internal Medicine

## 2018-01-25 ENCOUNTER — Telehealth: Payer: Self-pay | Admitting: Radiology

## 2018-01-25 VITALS — BP 130/72 | HR 59 | Temp 98.5°F | Ht 61.0 in | Wt 184.0 lb

## 2018-01-25 DIAGNOSIS — R609 Edema, unspecified: Secondary | ICD-10-CM | POA: Diagnosis not present

## 2018-01-25 DIAGNOSIS — Z515 Encounter for palliative care: Secondary | ICD-10-CM | POA: Diagnosis not present

## 2018-01-25 DIAGNOSIS — E785 Hyperlipidemia, unspecified: Secondary | ICD-10-CM

## 2018-01-25 DIAGNOSIS — F322 Major depressive disorder, single episode, severe without psychotic features: Secondary | ICD-10-CM | POA: Diagnosis not present

## 2018-01-25 DIAGNOSIS — N183 Chronic kidney disease, stage 3 unspecified: Secondary | ICD-10-CM

## 2018-01-25 MED ORDER — TORSEMIDE 10 MG PO TABS: 10.0000 mg | ORAL_TABLET | Freq: Every day | ORAL | 1 refills | Status: DC

## 2018-01-25 NOTE — Telephone Encounter (Signed)
Pt brought to lab by CMA. Pt was accompanied by her sister. Verified pt name and DOB. Asked pt which arm she would like to use. Pt stated, "I'm not sure, I'm usually a very hard stick." Asked pt if she had had any fluids today. Pt stated that she had not had much water today. Attempted first blood draw in left AC. Butterfly was used. Pt vein began to blow and needle was removed. Advised pt that the vein had started to blow and asked if I could look at right arm. Pt agreed. Tied tourniquet around right arm and looked for a vein. Did not see any viable veins to use on right arm.Went back to pt left arm and attempted a second time on another shallow vein with butterfly needle. Unsuccessful second attempt. Advised pt of her options of letting the CMA take a look at her veins or orders could be placed for her to go to the hospital or [facility] to have labs drawn. Pt sister came up and grabbed tourniquet and stated, "Let me look at her arm, I used to draw blood and I'm not taking her to the hospital, it's to far." Asked pt sister politely to please have a seat back over in the waiting area. Pt sister refused. Notified CMA and CMA came to lab and asked pt if he could look at her veins. Pt agreed. While CMA was looking for a vein, I notified clerical team lead. Clerical team lead came down to lab and spoke with pt sister about the liability of the situation. CMA attempted to get blood and was unsuccessful. Gave pt options of going to the hospital or [facility] again or I would speak with the ordering provider to see what she would like to do. PT sister stated, "I'm not taking her to the hospital so please speak with the doctor." Notified PCP of situation. PCP wrote RX for pt to have labs drawn at facility that pt resides at, [facility]. CMA called facility and was told that they only draw labs between 7:30-1:30. Notified pt sister of the time frame that the facility only draws labs and that the provider wants labs today  due to one of the labs being STAT. PT sister stated, "Why did she order it STAT? It doesn't need to be STAT." Notified PCP of sisters response and PCP gave verbal that pt can have labs drawn at facility tomorrow. Notified pt and pt sister.

## 2018-01-25 NOTE — Addendum Note (Signed)
Addended by: Arby Barrette on: 01/25/2018 02:25 PM   Modules accepted: Orders

## 2018-01-25 NOTE — Patient Instructions (Addendum)
Follow up in 4-6 weeks  Take care    Chronic Kidney Disease, Adult Chronic kidney disease (CKD) happens when the kidneys are damaged during a time of 3 or more months. The kidneys are two organs that do many important jobs in the body. These jobs include:  Removing wastes and extra fluids from the blood.  Making hormones that maintain the amount of fluid in your tissues and blood vessels.  Making sure that the body has the right amount of fluids and chemicals.  Most of the time, this condition does not go away, but it can usually be controlled. Steps must be taken to slow down the kidney damage or stop it from getting worse. Otherwise, the kidneys may stop working. Follow these instructions at home:  Follow your diet as told by your doctor. You may need to avoid alcohol, salty foods (sodium), and foods that are high in potassium, calcium, and protein.  Take over-the-counter and prescription medicines only as told by your doctor. Do not take any new medicines unless your doctor says you can do that. These include vitamins and minerals. ? Medicines and nutritional supplements can make kidney damage worse. ? Your doctor may need to change how much medicine you take.  Do not use any tobacco products. These include cigarettes, chewing tobacco, and e-cigarettes. If you need help quitting, ask your doctor.  Keep all follow-up visits as told by your doctor. This is important.  Check your blood pressure. Tell your doctor if there are changes to your blood pressure.  Get to a healthy weight. Stay at that weight. If you need help with this, ask your doctor.  Start or continue an exercise plan. Try to exercise at least 30 minutes a day, 5 days a week.  Stay up-to-date with your shots (immunizations) as told by your doctor. Contact a doctor if:  Your symptoms get worse.  You have new symptoms. Get help right away if:  You have symptoms of end-stage kidney disease. These  include: ? Headaches. ? Skin that is darker or lighter than normal. ? Numbness in your hands or feet. ? Easy bruising. ? Having hiccups often. ? Chest pain. ? Shortness of breath. ? Stopping of menstrual periods in women.  You have a fever.  You are making very little pee (urine).  You have pain or bleeding when you pee (urinate). This information is not intended to replace advice given to you by your health care provider. Make sure you discuss any questions you have with your health care provider. Document Released: 11/02/2009 Document Revised: 01/14/2016 Document Reviewed: 04/06/2012 Elsevier Interactive Patient Education  2017 [employer]  Edema Edema is an abnormal buildup of fluids in your bodytissues. Edema is somewhatdependent on gravity to pull the fluid to the lowest place in your body. That makes the condition more common in the legs and thighs (lower extremities). Painless swelling of the feet and ankles is common and becomes more likely as you get older. It is also common in looser tissues, like around your eyes. When the affected area is squeezed, the fluid may move out of that spot and leave a dent for a few moments. This dent is called pitting. What are the causes? There are many possible causes of edema. Eating too much salt and being on your feet or sitting for a long time can cause edema in your legs and ankles. Hot weather may make edema worse. Common medical causes of edema include:  Heart failure.  Liver disease.  Kidney disease.  Weak blood vessels in your legs.  Cancer.  An injury.  Pregnancy.  Some medications.  Obesity.  What are the signs or symptoms? Edema is usually painless.Your skin may look swollen or shiny. How is this diagnosed? Your health care provider may be able to diagnose edema by asking about your medical history and doing a physical exam. You may need to have tests such as X-rays, an electrocardiogram, or blood tests to  check for medical conditions that may cause edema. How is this treated? Edema treatment depends on the cause. If you have heart, liver, or kidney disease, you need the treatment appropriate for these conditions. General treatment may include:  Elevation of the affected body part above the level of your heart.  Compression of the affected body part. Pressure from elastic bandages or support stockings squeezes the tissues and forces fluid back into the blood vessels. This keeps fluid from entering the tissues.  Restriction of fluid and salt intake.  Use of a water pill (diuretic). These medications are appropriate only for some types of edema. They pull fluid out of your body and make you urinate more often. This gets rid of fluid and reduces swelling, but diuretics can have side effects. Only use diuretics as directed by your health care provider.  Follow these instructions at home:  Keep the affected body part above the level of your heart when you are lying down.  Do not sit still or stand for prolonged periods.  Do not put anything directly under your knees when lying down.  Do not wear constricting clothing or garters on your upper legs.  Exercise your legs to work the fluid back into your blood vessels. This may help the swelling go down.  Wear elastic bandages or support stockings to reduce ankle swelling as directed by your health care provider.  Eat a low-salt diet to reduce fluid if your health care provider recommends it.  Only take medicines as directed by your health care provider. Contact a health care provider if:  Your edema is not responding to treatment.  You have heart, liver, or kidney disease and notice symptoms of edema.  You have edema in your legs that does not improve after elevating them.  You have sudden and unexplained weight gain. Get help right away if:  You develop shortness of breath or chest pain.  You cannot breathe when you lie down.  You  develop pain, redness, or warmth in the swollen areas.  You have heart, liver, or kidney disease and suddenly get edema.  You have a fever and your symptoms suddenly get worse. This information is not intended to replace advice given to you by your health care provider. Make sure you discuss any questions you have with your health care provider. Document Released: 08/08/2005 Document Revised: 01/14/2016 Document Reviewed: 05/31/2013 Elsevier Interactive Patient Education  2017 [employer]  Lymphedema Lymphedema is swelling that is caused by the abnormal collection of lymph under the skin. Lymph is fluid from the tissues in your body that travels in the lymphatic system. This system is part of the immune system and includes lymph nodes and lymph vessels. The lymph vessels collect and carry the excess fluid, fats, proteins, and wastes from the tissues of the body to the bloodstream. This system also works to clean and remove bacteria and waste products from the body. Lymphedema occurs when the lymphatic system is blocked. When the lymph vessels or lymph nodes are blocked or damaged, lymph  does not drain properly, causing an abnormal buildup of lymph. This leads to swelling in the arms or legs. Lymphedema cannot be cured by medicines, but various methods can be used to help reduce the swelling. What are the causes? There are two types of lymphedema. Primary lymphedema is caused by the absence or abnormality of the lymph vessel at birth. Secondary lymphedema is more common. It occurs when the lymph vessel is damaged or blocked. Common causes of lymph vessel blockage include:  Skin infection, such as cellulitis.  Infection by parasites (filariasis).  Injury.  Cancer.  Radiation therapy.  Formation of scar tissue.  Surgery.  What are the signs or symptoms? Symptoms of this condition include:  Swelling of the arm or leg.  A heavy or tight feeling in the arm or leg.  Swelling of the  feet, toes, or fingers. Shoes or rings may fit more tightly than before.  Redness of the skin over the affected area.  Limited movement of the affected limb.  Sensitivity to touch or discomfort in the affected limb.  How is this diagnosed? This condition may be diagnosed with:  A physical exam.  Medical history.  Bioimpedance spectroscopy. In this test, painless electrical currents are used to measure fluid levels in your body.  Imaging tests, such as: ? Lymphoscintigraphy. In this test, a low dose of a radioactive substance is injected to trace the flow of lymph through the lymph vessels. ? MRI. ? CT scan. ? Duplex ultrasound. This test uses sound waves to produce images of the vessels and the blood flow on a screen. ? Lymphangiography. In this test, a contrast dye is injected into the lymph vessel to help show blockages.  How is this treated? Treatment for this condition may depend on the cause. Treatment may include:  Exercise. Certain exercises can help fluid move out of the affected limb.  Massage. Gentle massage of the affected limb can help move the fluid out of the area.  Compression. Various methods may be used to apply pressure to the affected limb in order to reduce the swelling. ? Wearing compression stockings or sleeves on the affected limb. ? Bandaging the affected limb. ? Using an external pump that is attached to a sleeve that alternates between applying pressure and releasing pressure.  Surgery. This is usually only done for severe cases. For example, surgery may be done if you have trouble moving the limb or if the swelling does not get better with other treatments.  If an underlying condition is causing the lymphedema, treatment for that condition is needed. For example, antibiotic medicines may be used to treat an infection. Follow these instructions at home: Activities  Exercise regularly as directed by your health care provider.  Do not sit with your  legs crossed.  When possible, keep the affected limb raised (elevated) above the level of your heart.  Avoid carrying things with an arm that is affected by lymphedema.  Remember that the affected area is more likely to become injured or infected.  Take these steps to help prevent infection: ? Keep the affected area clean and dry. ? Protect your skin from cuts. For example, you should use gloves while cooking or gardening. Do not walk barefoot. If you shave the affected area, use an Copy. General instructions  Take medicines only as directed by your health care provider.  Eat a healthy diet that includes a lot of fruits and vegetables.  Do not wear tight clothes, shoes, or jewelry.  Do not use heating pads over the affected area.  Avoid having blood pressure checked on the affected limb.  Keep all follow-up visits as directed by your health care provider. This is important. Contact a health care provider if:  You continue to have swelling in your limb.  You have a fever.  You have a cut that does not heal.  You have redness or pain in the affected area.  You have new swelling in your limb that comes on suddenly.  You develop purplish spots or sores (lesions) on your limb. Get help right away if:  You have a skin rash.  You have chills or sweats.  You have shortness of breath. This information is not intended to replace advice given to you by your health care provider. Make sure you discuss any questions you have with your health care provider. Document Released: 06/05/2007 Document Revised: 04/14/2016 Document Reviewed: 07/16/2014 Elsevier Interactive Patient Education  [employer].

## 2018-01-25 NOTE — Progress Notes (Addendum)
Chief Complaint  Patient presents with  . Follow-up   F/u  1. C/o leg edema b/l on torsemide 5 mg qd not helping tried elevation not higher than heart not helping. Legs are uncomfortable. Sister and pt want to do D dimer instead of [place] today to w/u DVT 2. Depression mood improved on celexa 10 mg qd  3. CKD 3 GFR 40 Cr 1.18 11/30/17  4. HLD does not want statin   Review of Systems  Constitutional: Negative for weight loss.  HENT: Negative for hearing loss.   Eyes: Negative for blurred vision.  Respiratory: Negative for shortness of breath.   Cardiovascular: Positive for leg swelling. Negative for chest pain.  Gastrointestinal: Negative for abdominal pain.  Musculoskeletal: Negative for falls.  Skin: Negative for rash.  Psychiatric/Behavioral: Negative for depression.   Past Medical History:  Diagnosis Date  . Arthritis   . Cancer ([place])    skin mohs left ear ? 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 Colon polyps   . Gastric ulcer   . GERD (gastroesophageal reflux disease)   . Hyperlipidemia   . Hypertension   . Rotator cuff disorder    right  . Shingles    Past Surgical History:  Procedure Laterality Date  . KNEE ARTHROSCOPY     b/l    Family History  Problem Relation Age of Onset  . Cancer Mother        ?colon   . Diabetes Mother   . Early death Mother   . Heart disease Mother   . Hypertension Mother   . Hyperlipidemia Mother   . Cancer Father        ? MM  . Diabetes Sister   . Diabetes Brother   . Arthritis Brother    Social History   Socioeconomic History  . Marital status: Single    Spouse name: Not on file  . Number of children: Not on file  . Years of education: Not on file  . Highest education level: Not on file  Occupational History  . Occupation: retired  Scientific laboratory technician  . Financial resource strain: Not on file  . Food insecurity:    Worry: Not on file    Inability: Not on file  . Transportation needs:    Medical: Not on file     Non-medical: Not on file  Tobacco Use  . Smoking status: Never Smoker  . Smokeless tobacco: Never Used  Substance and Sexual Activity  . Alcohol use: Yes  . Drug use: No  . Sexual activity: Never  Lifestyle  . Physical activity:    Days per week: Not on file    Minutes per session: Not on file  . Stress: Not on file  Relationships  . Social connections:    Talks on phone: Not on file    Gets together: Not on file    Attends religious service: Not on file    Active member of club or organization: Not on file    Attends meetings of clubs or organizations: Not on file    Relationship status: Not on file  . Intimate partner violence:    Fear of current or ex partner: Not on file    Emotionally abused: Not on file    Physically abused: Not on file    Forced sexual activity: Not on file  Other Topics Concern  . Not on file  Social History Narrative   Retired former Physiological scientist  accounting, assoc. Degree    Moved from [place]    Wears seat belts, no guns at home    No exercise    Current Meds  Medication Sig  . acetaminophen (TYLENOL) 325 MG tablet Take 650 mg by mouth every 4 (four) hours as needed. Every 4 hours as needed prn for pain and/or increased temp. May be administered orally (via gastric tube if present), or rectally - if patient unable to take orally.  . Cholecalciferol (VITAMIN D3) 2000 units TABS Take 2,000 Units by mouth daily.  . citalopram (CELEXA) 10 MG tablet Take 1 tablet (10 mg total) by mouth daily.  . cyanocobalamin 1000 MCG tablet Take 1,000 mcg by mouth daily.  Marland Kitchen ipratropium (ATROVENT) 0.03 % nasal spray Place 2 sprays into both nostrils 2 (two) times daily.  . Lidocaine 4 % PTCH Apply 1 patch topically daily. Right Ribs/ Flank. Remove after 12 hours  . Magnesium Oxide 250 MG TABS Take 1 tablet by mouth daily.  Marland Kitchen torsemide (DEMADEX) 5 MG tablet Take 5 mg by mouth daily.  . traZODone (DESYREL) 50 MG tablet Take 50 mg by mouth at bedtime.   Allergies   Allergen Reactions  . Penicillins     ?rxn   . Statins     ? Rxn cant tolerate crestor and lipitor noted prev records.    Recent Results (from the past 2160 hour(s))  Glucose, capillary     Status: Abnormal   Collection Time: 10/31/17  2:12 PM  Result Value Ref Range   Glucose-Capillary 172 (H) 65 - 99 mg/dL  Urine Drug Screen, Qualitative ([facility] only)     Status: None   Collection Time: 10/31/17  2:18 PM  Result Value Ref Range   Tricyclic, Ur Screen NONE DETECTED NONE DETECTED   Amphetamines, Ur Screen NONE DETECTED NONE DETECTED   MDMA (Ecstasy)Ur Screen NONE DETECTED NONE DETECTED   Cocaine Metabolite,Ur [place] NONE DETECTED NONE DETECTED   Opiate, Ur Screen NONE DETECTED NONE DETECTED   Phencyclidine (PCP) Ur S NONE DETECTED NONE DETECTED   Cannabinoid 50 Ng, Ur [place] NONE DETECTED NONE DETECTED   Barbiturates, Ur Screen NONE DETECTED NONE DETECTED   Benzodiazepine, Ur Scrn NONE DETECTED NONE DETECTED   Methadone Scn, Ur NONE DETECTED NONE DETECTED    Comment: (NOTE) Tricyclics + metabolites, urine    Cutoff 1000 ng/mL Amphetamines + metabolites, urine  Cutoff 1000 ng/mL MDMA (Ecstasy), urine              Cutoff 500 ng/mL Cocaine Metabolite, urine          Cutoff 300 ng/mL Opiate + metabolites, urine        Cutoff 300 ng/mL Phencyclidine (PCP), urine         Cutoff 25 ng/mL Cannabinoid, urine                 Cutoff 50 ng/mL Barbiturates + metabolites, urine  Cutoff 200 ng/mL Benzodiazepine, urine              Cutoff 200 ng/mL Methadone, urine                   Cutoff 300 ng/mL The urine drug screen provides only a preliminary, unconfirmed analytical test result and should not be used for non-medical purposes. Clinical consideration and professional judgment should be applied to any positive drug screen result due to possible interfering substances. A more specific alternate chemical method must be used in order to obtain a confirmed  analytical result. Gas chromatography / mass  spectrometry (GC/MS) is the preferred confirmat ory method. Performed at [facility], [address] [place], [place] [address]   Urinalysis, Complete w Microscopic     Status: Abnormal   Collection Time: 10/31/17  2:18 PM  Result Value Ref Range   Color, Urine YELLOW (A) YELLOW   APPearance CLEAR (A) CLEAR   Specific Gravity, Urine 1.008 1.005 - 1.030   pH 6.0 5.0 - 8.0   Glucose, UA 50 (A) NEGATIVE mg/dL   Hgb urine dipstick NEGATIVE NEGATIVE   Bilirubin Urine NEGATIVE NEGATIVE   Ketones, ur NEGATIVE NEGATIVE mg/dL   Protein, ur 30 (A) NEGATIVE mg/dL   Nitrite NEGATIVE NEGATIVE   Leukocytes, UA NEGATIVE NEGATIVE   RBC / HPF NONE SEEN 0 - 5 RBC/hpf   WBC, UA 0-5 0 - 5 WBC/hpf   Bacteria, UA RARE (A) NONE SEEN   Squamous Epithelial / LPF 0-5 (A) NONE SEEN   Mucus PRESENT     Comment: Performed at [facility], [address] [place], [place] [address]  Pregnancy, urine POC     Status: None   Collection Time: 10/31/17  2:23 PM  Result Value Ref Range   Preg Test, Ur NEGATIVE NEGATIVE    Comment:        THE SENSITIVITY OF THIS METHODOLOGY IS >24 mIU/mL   Blood gas, venous     Status: None   Collection Time: 10/31/17  2:36 PM  Result Value Ref Range   pH, Ven 7.32 7.250 - 7.430   pCO2, Ven 44 44.0 - 60.0 mmHg   Patient temperature 37.0    Collection site VENOUS    Sample type VENOUS     Comment: Performed at [facility], [address] [place], [place] [address]  Lactic acid, plasma     Status: Abnormal   Collection Time: 10/31/17  2:40 PM  Result Value Ref Range   Lactic Acid, Venous 5.2 (HH) 0.5 - 1.9 mmol/L    Comment: CRITICAL RESULT CALLED TO, READ BACK BY AND VERIFIED WITH CASSIE WELCH _0  10/31/17 AKT Performed at [facility], [address] [place], [place] [address]   Acetaminophen level     Status: Abnormal   Collection Time: 10/31/17  2:40 PM  Result Value Ref Range   Acetaminophen (Tylenol), Serum <10 (L)  10 - 30 ug/mL    Comment:        THERAPEUTIC CONCENTRATIONS VARY SIGNIFICANTLY. A RANGE OF 10-30 ug/mL MAY BE AN EFFECTIVE CONCENTRATION FOR MANY PATIENTS. HOWEVER, SOME ARE BEST TREATED AT CONCENTRATIONS OUTSIDE THIS RANGE. ACETAMINOPHEN CONCENTRATIONS >150 ug/mL AT 4 HOURS AFTER INGESTION AND >50 ug/mL AT 12 HOURS AFTER INGESTION ARE OFTEN ASSOCIATED WITH TOXIC REACTIONS. Performed at [facility], [address] [place], [place] [address]   CBC with Differential/Platelet     Status: Abnormal   Collection Time: 10/31/17  2:40 PM  Result Value Ref Range   WBC 12.3 (H) 3.6 - 11.0 K/uL   RBC 3.68 (L) 3.80 - 5.20 MIL/uL   Hemoglobin 11.9 (L) 12.0 - 16.0 g/dL   HCT 35.6 35.0 - 47.0 %   MCV 96.8 80.0 - 100.0 fL   MCH 32.3 26.0 - 34.0 pg   MCHC 33.4 32.0 - 36.0 g/dL   RDW 12.6 11.5 - 14.5 %   Platelets 327 150 - 440 K/uL   Neutrophils Relative % 84 %   Neutro Abs 10.3 (H) 1.4 - 6.5 K/uL  Lymphocytes Relative 9 %   Lymphs Abs 1.1 1.0 - 3.6 K/uL   Monocytes Relative 6 %   Monocytes Absolute 0.7 0.2 - 0.9 K/uL   Eosinophils Relative 0 %   Eosinophils Absolute 0.0 0 - 0.7 K/uL   Basophils Relative 1 %   Basophils Absolute 0.1 0 - 0.1 K/uL    Comment: Performed at [facility], [address] [place], [place] [address]  Comprehensive metabolic panel     Status: Abnormal   Collection Time: 10/31/17  2:40 PM  Result Value Ref Range   Sodium 139 135 - 145 mmol/L   Potassium 3.9 3.5 - 5.1 mmol/L   Chloride 105 101 - 111 mmol/L   CO2 20 (L) 22 - 32 mmol/L   Glucose, Bld 167 (H) 65 - 99 mg/dL   BUN 16 6 - 20 mg/dL   Creatinine, Ser 1.36 (H) 0.44 - 1.00 mg/dL   Calcium 8.8 (L) 8.9 - 10.3 mg/dL   Total Protein 6.6 6.5 - 8.1 g/dL   Albumin 3.7 3.5 - 5.0 g/dL   AST 30 15 - 41 U/L   ALT 13 (L) 14 - 54 U/L   Alkaline Phosphatase 59 38 - 126 U/L   Total Bilirubin 1.9 (H) 0.3 - 1.2 mg/dL   GFR calc non Af Amer 34 (L) >60 mL/min   GFR calc Af Amer 39 (L) >60  mL/min    Comment: (NOTE) The eGFR has been calculated using the CKD EPI equation. This calculation has not been validated in all clinical situations. eGFR's persistently <60 mL/min signify possible Chronic Kidney Disease.    Anion gap 14 5 - 15    Comment: Performed at [facility], [address] [place], [place] [address]  Salicylate level     Status: None   Collection Time: 10/31/17  2:40 PM  Result Value Ref Range   Salicylate Lvl <4.4 2.8 - 30.0 mg/dL    Comment: Performed at [facility], [address] [place], [place] [address]  Magnesium     Status: Abnormal   Collection Time: 10/31/17  2:40 PM  Result Value Ref Range   Magnesium 1.2 (L) 1.7 - 2.4 mg/dL    Comment: Performed at [facility], [address] [place], [place] [address]  Lactic acid, plasma     Status: Abnormal   Collection Time: 10/31/17  6:28 PM  Result Value Ref Range   Lactic Acid, Venous 7.6 (HH) 0.5 - 1.9 mmol/L    Comment: CRITICAL RESULT CALLED TO, READ BACK BY AND VERIFIED WITH SIMONE TOURE AT 1906 10/31/2017.  TFK Performed at [facility], [address] [place],  [address]   Glucose, capillary     Status: Abnormal   Collection Time: 10/31/17  7:54 PM  Result Value Ref Range   Glucose-Capillary 138 (H) 65 - 99 mg/dL  Blood gas, venous     Status: Abnormal   Collection Time: 10/31/17  9:39 PM  Result Value Ref Range   pH, Ven 7.18 (LL) 7.250 - 7.430    Comment: CRITICAL RESULT CALLED TO, READ BACK BY AND VERIFIED WITH: DR. Jannifer Franklin AT 2146 ON 3/12.19 KSL     pCO2, Ven 46 44.0 - 60.0 mmHg   Bicarbonate 17.2 (L) 20.0 - 28.0 mmol/L   Acid-base deficit 10.9 (H) 0.0 - 2.0 mmol/L   Patient temperature 37.0    Collection site VENOUS    Sample type VENOUS     Comment: Performed at  [facility], [address] [place], [place] [address]  Glucose, capillary     Status: Abnormal   Collection Time: 10/31/17 10:02 PM  Result Value Ref Range    Glucose-Capillary 106 (H) 65 - 99 mg/dL  Lactic acid, plasma     Status: Abnormal   Collection Time: 10/31/17 10:29 PM  Result Value Ref Range   Lactic Acid, Venous 8.4 (HH) 0.5 - 1.9 mmol/L    Comment: CRITICAL RESULT CALLED TO, READ BACK BY AND VERIFIED WITH TEQUIERA NESBITT ON 10/31/17 AT 2332 JAG Performed at [facility], [address] [place], [place] [address]   Glucose, capillary     Status: Abnormal   Collection Time: 11/01/17 12:00 AM  Result Value Ref Range   Glucose-Capillary 125 (H) 65 - 99 mg/dL  Lactic acid, plasma     Status: Abnormal   Collection Time: 11/01/17  1:16 AM  Result Value Ref Range   Lactic Acid, Venous 9.7 (HH) 0.5 - 1.9 mmol/L    Comment: CRITICAL RESULT CALLED TO, READ BACK BY AND VERIFIED WITH JASMINE SORIANO 11/01/17 @ 1224  [place] Performed at [facility], [address] [place], [place] [address]   Blood gas, venous     Status: Abnormal   Collection Time: 11/01/17  1:16 AM  Result Value Ref Range   pH, Ven 7.19 (LL) 7.250 - 7.430    Comment: CRITICAL RESULT CALLED TO, READ BACK BY AND VERIFIED WITH: DR. DIAMOND AT 0140 ON 11/01/17 KSL    pCO2, Ven 40 (L) 44.0 - 60.0 mmHg   Bicarbonate 15.3 (L) 20.0 - 28.0 mmol/L   Acid-base deficit 12.3 (H) 0.0 - 2.0 mmol/L   Patient temperature 37.0    Collection site VENOUS    Sample type VENOUS     Comment: Performed at [facility], [address] [place], [place] [address]  Glucose, capillary     Status: Abnormal   Collection Time: 11/01/17  1:57 AM  Result Value Ref Range   Glucose-Capillary 118 (H) 65 - 99 mg/dL   Comment 1 Notify RN   Glucose, capillary     Status: Abnormal   Collection Time: 11/01/17  4:06 AM  Result Value Ref Range   Glucose-Capillary 102 (H) 65 - 99 mg/dL   Comment 1 Notify RN   Lactic acid, plasma     Status: Abnormal   Collection Time: 11/01/17  5:56 AM  Result Value Ref Range   Lactic Acid, Venous 9.0 (HH) 0.5 - 1.9 mmol/L    Comment:  CRITICAL RESULT CALLED TO, READ BACK BY AND VERIFIED WITH YASMIN SORIANO AT 8891 ON 11/01/17 [place]. Performed at [facility], [address] [place], [place] [address]   CBC     Status: Abnormal   Collection Time: 11/01/17  5:56 AM  Result Value Ref Range   WBC 13.4 (H) 3.6 - 11.0 K/uL   RBC 3.83 3.80 - 5.20 MIL/uL   Hemoglobin 12.3 12.0 - 16.0 g/dL   HCT 37.9 35.0 - 47.0 %   MCV 99.1 80.0 - 100.0 fL   MCH 32.2 26.0 - 34.0 pg   MCHC 32.5 32.0 - 36.0 g/dL   RDW 13.4 11.5 - 14.5 %   Platelets 296 150 - 440 K/uL    Comment: Performed at [facility], [address], [place], [place] [address]  Basic metabolic panel     Status: Abnormal   Collection Time: 11/01/17  5:56 AM  Result Value Ref  Range   Sodium 142 135 - 145 mmol/L    Comment: ELECTROLYTES REPEATED.[facility]   Potassium 5.3 (H) 3.5 - 5.1 mmol/L   Chloride 106 101 - 111 mmol/L   CO2 14 (L) 22 - 32 mmol/L   Glucose, Bld 134 (H) 65 - 99 mg/dL   BUN 21 (H) 6 - 20 mg/dL   Creatinine, Ser 1.70 (H) 0.44 - 1.00 mg/dL   Calcium 8.3 (L) 8.9 - 10.3 mg/dL   GFR calc non Af Amer 26 (L) >60 mL/min   GFR calc Af Amer 30 (L) >60 mL/min    Comment: (NOTE) The eGFR has been calculated using the CKD EPI equation. This calculation has not been validated in all clinical situations. eGFR's persistently <60 mL/min signify possible Chronic Kidney Disease.    Anion gap 22 (H) 5 - 15    Comment: Performed at [facility], [address] [place], [place] [address]  Magnesium     Status: None   Collection Time: 11/01/17  5:56 AM  Result Value Ref Range   Magnesium 2.2 1.7 - 2.4 mg/dL    Comment: Performed at [facility], [address] [place], [place] [address]  Glucose, capillary     Status: Abnormal   Collection Time: 11/01/17  5:57 AM  Result Value Ref Range   Glucose-Capillary 118 (H) 65 - 99 mg/dL  Lactic acid, plasma     Status: Abnormal   Collection Time: 11/01/17  7:52 AM  Result Value Ref  Range   Lactic Acid, Venous 8.6 (HH) 0.5 - 1.9 mmol/L    Comment: CRITICAL RESULT CALLED TO, READ BACK BY AND VERIFIED WITH [place] JOHNSTONE AT 3818 11/01/17 DAS Performed at [facility], [address] [place], [place] [address]   Glucose, capillary     Status: Abnormal   Collection Time: 11/01/17  8:00 AM  Result Value Ref Range   Glucose-Capillary 109 (H) 65 - 99 mg/dL  Glucose, capillary     Status: Abnormal   Collection Time: 11/01/17 10:10 AM  Result Value Ref Range   Glucose-Capillary 109 (H) 65 - 99 mg/dL  Lactic acid, plasma     Status: Abnormal   Collection Time: 11/01/17 10:38 AM  Result Value Ref Range   Lactic Acid, Venous 6.2 (HH) 0.5 - 1.9 mmol/L    Comment: CRITICAL RESULT CALLED TO, READ BACK BY AND VERIFIED WITH JANCY JOHNSTONE AT 1114 ON 11/01/17 [place]. Performed at [facility], [address] [place], [place] [address]   Glucose, capillary     Status: Abnormal   Collection Time: 11/01/17 11:58 AM  Result Value Ref Range   Glucose-Capillary 107 (H) 65 - 99 mg/dL  Lactic acid, plasma     Status: Abnormal   Collection Time: 11/01/17  2:01 PM  Result Value Ref Range   Lactic Acid, Venous 4.3 (HH) 0.5 - 1.9 mmol/L    Comment: CRITICAL RESULT CALLED TO, READ BACK BY AND VERIFIED WITH JANCY JONESTONE _0  11/01/17 AKT Performed at [facility], [address] [place], [place] [address]   Glucose, capillary     Status: Abnormal   Collection Time: 11/01/17  2:04 PM  Result Value Ref Range   Glucose-Capillary 115 (H) 65 - 99 mg/dL  Glucose, capillary     Status: Abnormal   Collection Time: 11/01/17  3:51 PM  Result Value Ref Range   Glucose-Capillary 112 (H) 65 - 99 mg/dL  Lactic acid, plasma     Status: Abnormal  Collection Time: 11/01/17  5:45 PM  Result Value Ref Range   Lactic Acid, Venous 4.6 (HH) 0.5 - 1.9 mmol/L    Comment: CRITICAL RESULT CALLED TO, READ BACK BY AND VERIFIED WITH TAMMY TODD 11/01/17 1826 KLW Performed at  [facility], [address] [place], [place] [address]   Glucose, capillary     Status: Abnormal   Collection Time: 11/01/17  6:16 PM  Result Value Ref Range   Glucose-Capillary 126 (H) 65 - 99 mg/dL  Glucose, capillary     Status: Abnormal   Collection Time: 11/01/17  7:55 PM  Result Value Ref Range   Glucose-Capillary 130 (H) 65 - 99 mg/dL  Glucose, capillary     Status: Abnormal   Collection Time: 11/01/17 11:43 PM  Result Value Ref Range   Glucose-Capillary 109 (H) 65 - 99 mg/dL   Comment 1 Notify RN   Glucose, capillary     Status: Abnormal   Collection Time: 11/02/17  3:49 AM  Result Value Ref Range   Glucose-Capillary 107 (H) 65 - 99 mg/dL   Comment 1 Notify RN   Glucose, capillary     Status: None   Collection Time: 11/02/17  7:58 AM  Result Value Ref Range   Glucose-Capillary 93 65 - 99 mg/dL   Comment 1 Notify RN   Basic metabolic panel     Status: Abnormal   Collection Time: 11/02/17  8:08 AM  Result Value Ref Range   Sodium 140 135 - 145 mmol/L   Potassium 3.8 3.5 - 5.1 mmol/L   Chloride 103 101 - 111 mmol/L   CO2 26 22 - 32 mmol/L   Glucose, Bld 108 (H) 65 - 99 mg/dL   BUN 41 (H) 6 - 20 mg/dL   Creatinine, Ser 2.53 (H) 0.44 - 1.00 mg/dL   Calcium 8.2 (L) 8.9 - 10.3 mg/dL   GFR calc non Af Amer 16 (L) >60 mL/min   GFR calc Af Amer 19 (L) >60 mL/min    Comment: (NOTE) The eGFR has been calculated using the CKD EPI equation. This calculation has not been validated in all clinical situations. eGFR's persistently <60 mL/min signify possible Chronic Kidney Disease.    Anion gap 11 5 - 15    Comment: Performed at [facility], [address] [place], [place] [address]  Lactic acid, plasma     Status: None   Collection Time: 11/02/17  8:08 AM  Result Value Ref Range   Lactic Acid, Venous 1.9 0.5 - 1.9 mmol/L    Comment: Performed at [facility], [address] [place], [place] [address]  Glucose, capillary     Status: Abnormal    Collection Time: 11/02/17  5:02 PM  Result Value Ref Range   Glucose-Capillary 106 (H) 65 - 99 mg/dL   Comment 1 Notify RN   Glucose, capillary     Status: Abnormal   Collection Time: 11/02/17  9:38 PM  Result Value Ref Range   Glucose-Capillary 113 (H) 65 - 99 mg/dL   Comment 1 Notify RN    Comment 2 Document in Chart   Renal function panel     Status: Abnormal   Collection Time: 11/03/17  5:17 AM  Result Value Ref Range   Sodium 142 135 - 145 mmol/L   Potassium 3.6 3.5 - 5.1 mmol/L   Chloride 99 (L) 101 - 111 mmol/L   CO2 31 22 - 32 mmol/L   Glucose, Bld 126 (H) 65 -  99 mg/dL   BUN 33 (H) 6 - 20 mg/dL   Creatinine, Ser 1.86 (H) 0.44 - 1.00 mg/dL   Calcium 8.6 (L) 8.9 - 10.3 mg/dL   Phosphorus 2.8 2.5 - 4.6 mg/dL   Albumin 3.5 3.5 - 5.0 g/dL   GFR calc non Af Amer 23 (L) >60 mL/min   GFR calc Af Amer 27 (L) >60 mL/min    Comment: (NOTE) The eGFR has been calculated using the CKD EPI equation. This calculation has not been validated in all clinical situations. eGFR's persistently <60 mL/min signify possible Chronic Kidney Disease.    Anion gap 12 5 - 15    Comment: Performed at [facility], [address] [place], [place] [address]  Glucose, capillary     Status: Abnormal   Collection Time: 11/03/17  8:08 AM  Result Value Ref Range   Glucose-Capillary 128 (H) 65 - 99 mg/dL   Comment 1 Notify RN   Glucose, capillary     Status: Abnormal   Collection Time: 11/03/17 11:49 AM  Result Value Ref Range   Glucose-Capillary 108 (H) 65 - 99 mg/dL   Comment 1 Notify RN   Glucose, capillary     Status: Abnormal   Collection Time: 11/03/17  4:27 PM  Result Value Ref Range   Glucose-Capillary 112 (H) 65 - 99 mg/dL  Basic metabolic panel     Status: Abnormal   Collection Time: 11/04/17  6:47 AM  Result Value Ref Range   Sodium 141 135 - 145 mmol/L   Potassium 3.6 3.5 - 5.1 mmol/L   Chloride 104 101 - 111 mmol/L   CO2 28 22 - 32 mmol/L   Glucose, Bld 130 (H) 65 -  99 mg/dL   BUN 22 (H) 6 - 20 mg/dL   Creatinine, Ser 1.34 (H) 0.44 - 1.00 mg/dL   Calcium 8.5 (L) 8.9 - 10.3 mg/dL   GFR calc non Af Amer 35 (L) >60 mL/min   GFR calc Af Amer 40 (L) >60 mL/min    Comment: (NOTE) The eGFR has been calculated using the CKD EPI equation. This calculation has not been validated in all clinical situations. eGFR's persistently <60 mL/min signify possible Chronic Kidney Disease.    Anion gap 9 5 - 15    Comment: Performed at [facility], [address] [place], [place] [address]  Basic metabolic panel     Status: Abnormal   Collection Time: 11/05/17 11:44 AM  Result Value Ref Range   Sodium 140 135 - 145 mmol/L   Potassium 4.8 3.5 - 5.1 mmol/L   Chloride 106 101 - 111 mmol/L   CO2 25 22 - 32 mmol/L   Glucose, Bld 162 (H) 65 - 99 mg/dL   BUN 22 (H) 6 - 20 mg/dL   Creatinine, Ser 1.20 (H) 0.44 - 1.00 mg/dL   Calcium 8.8 (L) 8.9 - 10.3 mg/dL   GFR calc non Af Amer 39 (L) >60 mL/min   GFR calc Af Amer 46 (L) >60 mL/min    Comment: (NOTE) The eGFR has been calculated using the CKD EPI equation. This calculation has not been validated in all clinical situations. eGFR's persistently <60 mL/min signify possible Chronic Kidney Disease.    Anion gap 9 5 - 15    Comment: Performed at [facility], [address] [place], [place] [address]  Glucose, capillary     Status: Abnormal   Collection Time: 11/05/17  7:25 PM  Result Value Ref Range  Glucose-Capillary 135 (H) 65 - 99 mg/dL  Hemoglobin A1c     Status: None   Collection Time: 11/06/17  6:44 AM  Result Value Ref Range   Hgb A1c MFr Bld 5.4 4.8 - 5.6 %    Comment: (NOTE) Pre diabetes:          5.7%-6.4% Diabetes:              >6.4% Glycemic control for   <7.0% adults with diabetes    Mean Plasma Glucose 108.28 mg/dL    Comment: Performed at [facility] [address], [place], [place] [address]  Lipid panel     Status: Abnormal   Collection Time: 11/06/17  6:44 AM   Result Value Ref Range   Cholesterol 239 (H) 0 - 200 mg/dL   Triglycerides 221 (H) <150 mg/dL   HDL 57 >40 mg/dL   Total CHOL/HDL Ratio 4.2 RATIO   VLDL 44 (H) 0 - 40 mg/dL   LDL Cholesterol 138 (H) 0 - 99 mg/dL    Comment:        Total Cholesterol/HDL:CHD Risk Coronary Heart Disease Risk Table                     Men   Women  1/2 Average Risk   3.4   3.3  Average Risk       5.0   4.4  2 X Average Risk   9.6   7.1  3 X Average Risk  23.4   11.0        Use the calculated Patient Ratio above and the CHD Risk Table to determine the patient's CHD Risk.        ATP III CLASSIFICATION (LDL):  <100     mg/dL   Optimal  100-129  mg/dL   Near or Above                    Optimal  130-159  mg/dL   Borderline  160-189  mg/dL   High  >190     mg/dL   Very High Performed at [facility], [address] [place], [place] [address]   TSH     Status: None   Collection Time: 11/06/17  6:44 AM  Result Value Ref Range   TSH 3.808 0.350 - 4.500 uIU/mL    Comment: Performed by a 3rd Generation assay with a functional sensitivity of <=0.01 uIU/mL. Performed at [facility], [address] [place], [place] [address]   Glucose, capillary     Status: Abnormal   Collection Time: 11/06/17  7:04 AM  Result Value Ref Range   Glucose-Capillary 133 (H) 65 - 99 mg/dL   Comment 1 Notify RN   Glucose, capillary     Status: Abnormal   Collection Time: 11/06/17 11:48 AM  Result Value Ref Range   Glucose-Capillary 106 (H) 65 - 99 mg/dL  Glucose, capillary     Status: Abnormal   Collection Time: 11/06/17  5:48 PM  Result Value Ref Range   Glucose-Capillary 157 (H) 65 - 99 mg/dL  Glucose, capillary     Status: Abnormal   Collection Time: 11/06/17  9:11 PM  Result Value Ref Range   Glucose-Capillary 132 (H) 65 - 99 mg/dL  Glucose, capillary     Status: Abnormal   Collection Time: 11/07/17  6:15 AM  Result Value Ref Range   Glucose-Capillary 115 (H) 65 - 99 mg/dL  Glucose,  capillary  Status: None   Collection Time: 11/07/17 11:42 AM  Result Value Ref Range   Glucose-Capillary 84 65 - 99 mg/dL  Glucose, capillary     Status: Abnormal   Collection Time: 11/07/17  4:22 PM  Result Value Ref Range   Glucose-Capillary 115 (H) 65 - 99 mg/dL  Glucose, capillary     Status: Abnormal   Collection Time: 11/07/17  8:16 PM  Result Value Ref Range   Glucose-Capillary 134 (H) 65 - 99 mg/dL  Glucose, capillary     Status: Abnormal   Collection Time: 11/08/17  8:49 PM  Result Value Ref Range   Glucose-Capillary 116 (H) 65 - 99 mg/dL  Glucose, capillary     Status: Abnormal   Collection Time: 11/09/17  6:21 AM  Result Value Ref Range   Glucose-Capillary 133 (H) 65 - 99 mg/dL  CBC with Differential/Platelet     Status: Abnormal   Collection Time: 11/30/17  5:30 AM  Result Value Ref Range   WBC 7.7 3.6 - 11.0 K/uL   RBC 3.41 (L) 3.80 - 5.20 MIL/uL   Hemoglobin 11.2 (L) 12.0 - 16.0 g/dL   HCT 33.3 (L) 35.0 - 47.0 %   MCV 97.6 80.0 - 100.0 fL   MCH 32.9 26.0 - 34.0 pg   MCHC 33.7 32.0 - 36.0 g/dL   RDW 13.6 11.5 - 14.5 %   Platelets 300 150 - 440 K/uL   Neutrophils Relative % 69 %   Neutro Abs 5.3 1.4 - 6.5 K/uL   Lymphocytes Relative 17 %   Lymphs Abs 1.3 1.0 - 3.6 K/uL   Monocytes Relative 9 %   Monocytes Absolute 0.7 0.2 - 0.9 K/uL   Eosinophils Relative 4 %   Eosinophils Absolute 0.3 0 - 0.7 K/uL   Basophils Relative 1 %   Basophils Absolute 0.1 0 - 0.1 K/uL    Comment: Performed at [facility], [address] [place], [place] [address]  Comprehensive metabolic panel     Status: Abnormal   Collection Time: 11/30/17  5:30 AM  Result Value Ref Range   Sodium 140 135 - 145 mmol/L   Potassium 3.6 3.5 - 5.1 mmol/L   Chloride 102 101 - 111 mmol/L   CO2 30 22 - 32 mmol/L   Glucose, Bld 108 (H) 65 - 99 mg/dL   BUN 19 6 - 20 mg/dL   Creatinine, Ser 1.18 (H) 0.44 - 1.00 mg/dL   Calcium 9.4 8.9 - 10.3 mg/dL   Total Protein 6.3 (L) 6.5 - 8.1 g/dL    Albumin 3.3 (L) 3.5 - 5.0 g/dL   AST 21 15 - 41 U/L   ALT 10 (L) 14 - 54 U/L   Alkaline Phosphatase 55 38 - 126 U/L   Total Bilirubin 0.9 0.3 - 1.2 mg/dL   GFR calc non Af Amer 40 (L) >60 mL/min   GFR calc Af Amer 47 (L) >60 mL/min    Comment: (NOTE) The eGFR has been calculated using the CKD EPI equation. This calculation has not been validated in all clinical situations. eGFR's persistently <60 mL/min signify possible Chronic Kidney Disease.    Anion gap 8 5 - 15    Comment: Performed at [facility], [address] [place],  [address]  Magnesium     Status: Abnormal   Collection Time: 11/30/17  5:30 AM  Result Value Ref Range   Magnesium 1.4 (L) 1.7 - 2.4 mg/dL    Comment: Performed at [facility], [place], [place] [address]  Lipid panel     Status: Abnormal   Collection Time: 11/30/17  5:30 AM  Result Value Ref Range   Cholesterol 243 (H) 0 - 200 mg/dL   Triglycerides 145 <150 mg/dL   HDL 61 >40 mg/dL   Total CHOL/HDL Ratio 4.0 RATIO   VLDL 29 0 - 40 mg/dL   LDL Cholesterol 153 (H) 0 - 99 mg/dL    Comment:        Total Cholesterol/HDL:CHD Risk Coronary Heart Disease Risk Table                     Men   Women  1/2 Average Risk   3.4   3.3  Average Risk       5.0   4.4  2 X Average Risk   9.6   7.1  3 X Average Risk  23.4   11.0        Use the calculated Patient Ratio above and the CHD Risk Table to determine the patient's CHD Risk.        ATP III CLASSIFICATION (LDL):  <100     mg/dL   Optimal  100-129  mg/dL   Near or Above                    Optimal  130-159  mg/dL   Borderline  160-189  mg/dL   High  >190     mg/dL   Very High Performed at [facility], [address] [place], [place] [address]   TSH     Status: None   Collection Time: 11/30/17  5:30 AM  Result Value Ref Range   TSH 2.763 0.350 - 4.500 uIU/mL    Comment: Performed by a 3rd Generation assay with a functional sensitivity of <=0.01  uIU/mL. Performed at [facility], [address] [place], [place] [address]   Vitamin B12     Status: None   Collection Time: 11/30/17  5:30 AM  Result Value Ref Range   Vitamin B-12 382 180 - 914 pg/mL    Comment: (NOTE) This assay is not validated for testing neonatal or myeloproliferative syndrome specimens for Vitamin B12 levels. Performed at [facility], [place] [address], [place], [place] [address]   VITAMIN D 25 Hydroxy (Vit-D Deficiency, Fractures)     Status: None   Collection Time: 11/30/17  5:30 AM  Result Value Ref Range   Vit D, 25-Hydroxy 37.1 30.0 - 100.0 ng/mL    Comment: (NOTE) Vitamin D deficiency has been defined by the [place] practice guideline as a level of serum 25-OH vitamin D less than 20 ng/mL (1,2). The Endocrine Society went on to further define vitamin D insufficiency as a level between 21 and 29 ng/mL (2). 1. IOM (Institute of Medicine). 2010. Dietary reference   intakes for calcium and D. [place]: The   [employer]. 2. Holick MF, Binkley [place], Bischoff-Ferrari HA, et al.   Evaluation, treatment, and prevention of vitamin D   deficiency: an Endocrine Society clinical practice   guideline. JCEM. 2011 Jul; 96(7):1911-30. Performed At: [facility] [place], [place] [id number] Rush Farmer MD CL:[id number] Performed at [facility], [address] [place], [place] [address]    Objective  Body mass index is 34.77 kg/m. Wt Readings from Last 3 Encounters:  01/25/18 184 lb (83.5 kg)  01/04/18 184 lb 9.6 oz (83.7 kg)  10/31/17 177 lb (80.3 kg)   Temp Readings from Last 3 Encounters:  01/25/18 98.5 F (36.9 C) (Oral)  01/04/18 (!) 97.2 F (36.2 C) (Oral)  11/05/17 98.2 F (36.8 C) (Oral)   BP Readings from Last 3 Encounters:  01/25/18 130/72  01/04/18 (!) 152/76  11/28/17 (!) 148/64   Pulse Readings from Last 3 Encounters:   01/25/18 (!) 59  01/04/18 71  11/28/17 70    Physical Exam  Constitutional: She is oriented to person, place, and time. Vital signs are normal. She appears well-developed and well-nourished. She is cooperative.  HENT:  Head: Normocephalic and atraumatic.  Mouth/Throat: Oropharynx is clear and moist and mucous membranes are normal.  Eyes: Pupils are equal, round, and reactive to light. Conjunctivae are normal.  Cardiovascular: Normal rate, regular rhythm and normal heart sounds.  Non pitting edema b/l legs R>L  Pulmonary/Chest: Effort normal and breath sounds normal.  Neurological: She is alert and oriented to person, place, and time. Gait normal.  Skin: Skin is warm, dry and intact.  Psychiatric: She has a normal mood and affect. Her speech is normal and behavior is normal. Judgment and thought content normal. Cognition and memory are normal.  Nursing note and vitals reviewed.   Assessment   1. Leg edema  2. Depression improved  3. CKD 3 GFR 40 and Cr 1.18 11/30/17  4. HLD declines statin  5. H/o DM 2 A1C 5.4 11/06/17  6. HM Plan   1. Inc torsemide 5 mg to 10 mg qd  F/u in 4-6 weeks  Try to get RN at [facility] to ACE wrap to knee q48 hrs  Check BMET, d dimer today  -lab unable to draw today will try to get at [facility] sister refuses to go to [facility] will see if [facility] will do  If + need b/l [place]  2. Cont meds  3. Check BMET today  4. Declines statin  5. Hold DM meds 6.  DNR signed today clarified with pt and sister Mae  Flu 04/27/17 Tdap never declines  pna vx 1/2 ? Which one in 2017  Declines shingrix  Pending records old PCP  [facility]. Cor[facility]e [facility] including vaccines, labs, prior mammograms, colonoscopies if present   Last colonoscopy several years ago h/o polyps no further testing other than fobt  Out of age window pap LMP 89 years ago  DEXA out of age window Mammogram out of age window  Refer to dermatology Dr. Anthoney Harada h/o skin cancer  SCC, BCC saw recently 2019  LN2 will f/u     Provider: Dr. Olivia Mackie McLean-Scocuzza-Internal Medicine

## 2018-01-26 ENCOUNTER — Telehealth: Payer: Self-pay

## 2018-01-26 ENCOUNTER — Other Ambulatory Visit
Admission: RE | Admit: 2018-01-26 | Discharge: 2018-01-26 | Disposition: A | Discharge status: Home / Self Care | Payer: Medicare Other | Source: Skilled Nursing Facility | Attending: Internal Medicine | Admitting: Internal Medicine

## 2018-01-26 DIAGNOSIS — N183 Chronic kidney disease, stage 3 (moderate): Secondary | ICD-10-CM | POA: Diagnosis not present

## 2018-01-26 DIAGNOSIS — R609 Edema, unspecified: Secondary | ICD-10-CM | POA: Diagnosis not present

## 2018-01-26 LAB — BASIC METABOLIC PANEL
Anion gap: 9 (ref 5–15)
BUN: 15 mg/dL (ref 6–20)
CALCIUM: 9.1 mg/dL (ref 8.9–10.3)
CO2: 30 mmol/L (ref 22–32)
CREATININE: 1.12 mg/dL — AB (ref 0.44–1.00)
Chloride: 99 mmol/L — ABNORMAL LOW (ref 101–111)
GFR calc non Af Amer: 43 mL/min — ABNORMAL LOW (ref >60)
GFR, EST AFRICAN AMERICAN: 49 mL/min — AB (ref >60)
Glucose, Bld: 95 mg/dL (ref 65–99)
Potassium: 4.3 mmol/L (ref 3.5–5.1)
Sodium: 138 mmol/L (ref 135–145)

## 2018-01-26 NOTE — Telephone Encounter (Signed)
Please call. I called [facility] and they said patient is not there. Please advise

## 2018-01-26 NOTE — Telephone Encounter (Signed)
Pt declined compression stockings yesterday which is why I wanted ace wraps

## 2018-01-26 NOTE — Telephone Encounter (Signed)
Copied from [place] [id number]. Topic: Inquiry >> Jan 26, 2018 11:33 AM Oliver Pila B wrote: Reason for CRM: [facility] called and asked for the pt b/c the pt would rather have the support stockings instead of the leg wraps and they are needing a verbal approval for these, call to advise

## 2018-01-26 NOTE — Telephone Encounter (Signed)
Ok to do

## 2018-02-06 ENCOUNTER — Non-Acute Institutional Stay: Payer: Medicare Other | Admitting: Gerontology

## 2018-02-06 ENCOUNTER — Encounter: Payer: Self-pay | Admitting: Gerontology

## 2018-02-06 DIAGNOSIS — N183 Chronic kidney disease, stage 3 unspecified: Secondary | ICD-10-CM

## 2018-02-06 DIAGNOSIS — R609 Edema, unspecified: Secondary | ICD-10-CM | POA: Diagnosis not present

## 2018-02-06 DIAGNOSIS — M109 Gout, unspecified: Secondary | ICD-10-CM

## 2018-02-19 ENCOUNTER — Encounter
Admission: RE | Admit: 2018-02-19 | Discharge: 2018-02-19 | Disposition: A | Discharge status: Home / Self Care | Payer: Medicare Other | Source: Ambulatory Visit | Attending: Internal Medicine | Admitting: Internal Medicine

## 2018-02-19 NOTE — Assessment & Plan Note (Signed)
Stable.  Minimal BLE edema.  Encouraging patient to elevate legs when at rest and TED hose as needed.  Also symptoms controlled on torsemide 10 mg p.o. daily

## 2018-02-19 NOTE — Assessment & Plan Note (Signed)
Stable. Renally adjust medications as appropriate 

## 2018-02-19 NOTE — Progress Notes (Signed)
Location:    Nursing Home Room Number: [id number] Place of Service:  ALF [phone]) Provider:  Toni Arthurs, NP-C  Kirk Ruths, MD  Patient Care Team: Kirk Ruths, MD as PCP - General (Internal Medicine) Toni Arthurs, NP as Nurse Practitioner (Family Medicine)  Extended Emergency Contact Information Primary Emergency Contact: northrup,mae Mobile Phone: [phone] Relation: Sister  Code Status:  DNR Goals of care: Advanced Directive information Advanced Directives 02/06/2018  Does Patient Have a Medical Advance Directive? Yes  Type of Advance Directive Out of facility DNR (pink MOST or yellow form)  Does patient want to make changes to medical advance directive? No - Patient declined  Would patient like information on creating a medical advance directive? -  Some encounter information is confidential and restricted. Go to Review Flowsheets activity to see all data.     Chief Complaint  Patient presents with  . Medical Management of Chronic Issues    Routine Visit    HPI:  Pt is a 84 y.o. female seen today for medical management of chronic diseases.    CKD (chronic kidney disease) stage 3, GFR 30-59 ml/min (HCC) Stable.  Renally adjust medications as appropriate  Edema Stable.  Minimal BLE edema.  Encouraging patient to elevate legs when at rest and TED hose as needed.  Also symptoms controlled on torsemide 10 mg p.o. daily  Gout Stable.  No recent exacerbations    Past Medical History:  Diagnosis Date  . Arthritis   . Cancer ([place])    skin mohs left ear ? 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 Colon polyps   . Gastric ulcer   . GERD (gastroesophageal reflux disease)   . Hyperlipidemia   . Hypertension   . Rotator cuff disorder    right  . Shingles    Past Surgical History:  Procedure Laterality Date  . KNEE ARTHROSCOPY     b/l     Allergies  Allergen Reactions  . Penicillins     ?rxn   . Statins     ? Rxn cant tolerate  crestor and lipitor noted prev records.     Allergies as of 02/06/2018      Reactions   Penicillins    ?rxn    Statins    ? Rxn cant tolerate crestor and lipitor noted prev records.       Medication List        Accurate as of 02/06/18 11:59 PM. Always use your most recent med list.          acetaminophen 325 MG tablet Commonly known as:  TYLENOL Take 650 mg by mouth every 4 (four) hours as needed. for pain and/or increased temp. May be administered orally (via gastric tube if present), or rectally - if patient unable to take orally.   citalopram 10 MG tablet Commonly known as:  CELEXA Take 1 tablet (10 mg total) by mouth daily.   cyanocobalamin 1000 MCG tablet Take 1,000 mcg by mouth daily.   ipratropium 0.03 % nasal spray Commonly known as:  ATROVENT Place 2 sprays into both nostrils 2 (two) times daily.   Lidocaine 4 % Ptch Apply 1 patch topically daily. Right Ribs/ Flank. Remove after 12 hours   Magnesium Oxide 250 MG Tabs Take 1 tablet by mouth daily.   torsemide 10 MG tablet Commonly known as:  DEMADEX Take 1 tablet (10 mg total) by mouth daily.   traZODone 50 MG tablet Commonly known  as:  DESYREL Take 50 mg by mouth at bedtime.   Vitamin D3 2000 units Tabs Take 2,000 Units by mouth daily.       Review of Systems  Constitutional: Negative for activity change, appetite change, chills, diaphoresis and fever.  HENT: Negative for congestion, mouth sores, nosebleeds, postnasal drip, sneezing, sore throat, trouble swallowing and voice change.   Respiratory: Negative for apnea, cough, choking, chest tightness, shortness of breath and wheezing.   Cardiovascular: Negative for chest pain, palpitations and leg swelling.  Gastrointestinal: Negative for abdominal distention, abdominal pain, constipation, diarrhea and nausea.  Genitourinary: Negative for difficulty urinating, dysuria, frequency and urgency.  Musculoskeletal: Negative for back pain, gait problem and  myalgias. Arthralgias: typical arthritis.  Skin: Negative for color change, pallor, rash and wound.  Neurological: Negative for dizziness, tremors, syncope, speech difficulty, weakness, numbness and headaches.  Psychiatric/Behavioral: Negative for agitation and behavioral problems.  All other systems reviewed and are negative.   Immunization History  Administered Date(s) Administered  . Influenza-Unspecified 04/27/2017   Pertinent  Health Maintenance Due  Topic Date Due  . FOOT EXAM  01/01/1938  . OPHTHALMOLOGY EXAM  01/01/1938  . DEXA SCAN  01/01/1995  . PNA vac Low Risk Adult (1 of 2 - PCV13) 01/01/1995  . INFLUENZA VACCINE  03/22/2018  . HEMOGLOBIN A1C  05/09/2018  . URINE MICROALBUMIN  09/27/2018   Fall Risk  01/25/2018 09/27/2017  Falls in the past year? No No   Functional Status Survey:    Vitals:   02/06/18 1354  BP: (!) 141/59  Pulse: 64  Resp: 16  Temp: (!) 96.6 F (35.9 C)  TempSrc: Oral  SpO2: 98%  Weight: 185 lb 3.2 oz (84 kg)  Height: 5\' 1"  (1.549 m)   Body mass index is 34.99 kg/m. Physical Exam  Constitutional: She is oriented to person, place, and time. Vital signs are normal. She appears well-developed and well-nourished. She is active and cooperative. She does not appear ill. No distress.  HENT:  Head: Normocephalic and atraumatic.  Mouth/Throat: Uvula is midline, oropharynx is clear and moist and mucous membranes are normal. Mucous membranes are not pale, not dry and not cyanotic.  Eyes: Pupils are equal, round, and reactive to light. Conjunctivae, EOM and lids are normal.  Neck: Trachea normal, normal range of motion and full passive range of motion without pain. Neck supple. No JVD present. No tracheal deviation, no edema and no erythema present. No thyromegaly present.  Cardiovascular: Normal rate, regular rhythm, normal heart sounds, intact distal pulses and normal pulses. Exam reveals no gallop, no distant heart sounds and no friction rub.  No  murmur heard. Pulses:      Dorsalis pedis pulses are 2+ on the right side, and 2+ on the left side.  No edema  Pulmonary/Chest: Effort normal and breath sounds normal. No accessory muscle usage. No respiratory distress. She has no decreased breath sounds. She has no wheezes. She has no rhonchi. She has no rales. She exhibits no tenderness.  Abdominal: Soft. Normal appearance and bowel sounds are normal. She exhibits no distension and no ascites. There is no tenderness.  Musculoskeletal: Normal range of motion. She exhibits no edema or tenderness.  Expected osteoarthritis, stiffness; Bilateral Calves soft, supple. Negative Homan's Sign. B- pedal pulses equal  Neurological: She is alert and oriented to person, place, and time. She has normal strength.  Skin: Skin is warm, dry and intact. She is not diaphoretic. No cyanosis. No pallor. Nails show no clubbing.  Psychiatric: She has a normal mood and affect. Her speech is normal and behavior is normal. Judgment and thought content normal. Cognition and memory are impaired. She exhibits abnormal recent memory.  Nursing note and vitals reviewed.   Labs reviewed: Recent Labs    10/31/17 1440 11/01/17 0556  11/03/17 0517  11/05/17 1144 11/30/17 0530 01/26/18 1215  NA 139 142   < > 142   < > 140 140 138  K 3.9 5.3*   < > 3.6   < > 4.8 3.6 4.3  CL 105 106   < > 99*   < > 106 102 99*  CO2 20* 14*   < > 31   < > 25 30 30   GLUCOSE 167* 134*   < > 126*   < > 162* 108* 95  BUN 16 21*   < > 33*   < > 22* 19 15  CREATININE 1.36* 1.70*   < > 1.86*   < > 1.20* 1.18* 1.12*  CALCIUM 8.8* 8.3*   < > 8.6*   < > 8.8* 9.4 9.1  MG 1.2* 2.2  --   --   --   --  1.4*  --   PHOS  --   --   --  2.8  --   --   --   --    < > = values in this interval not displayed.   Recent Labs    09/27/17 1148 10/31/17 1440 11/03/17 0517 11/30/17 0530  AST 18 30  --  21  ALT 14 13*  --  10*  ALKPHOS 53 59  --  55  BILITOT 1.0 1.9*  --  0.9  PROT 6.8 6.6  --  6.3*    ALBUMIN 3.9 3.7 3.5 3.3*   Recent Labs    09/27/17 1148 10/31/17 1440 11/01/17 0556 11/30/17 0530  WBC 8.3 12.3* 13.4* 7.7  NEUTROABS 5.6 10.3*  --  5.3  HGB 12.2 11.9* 12.3 11.2*  HCT 36.2 35.6 37.9 33.3*  MCV 97.5 96.8 99.1 97.6  PLT 323.0 327 296 300   Lab Results  Component Value Date   TSH 2.763 11/30/2017   Lab Results  Component Value Date   HGBA1C 5.4 11/06/2017   Lab Results  Component Value Date   CHOL 243 (H) 11/30/2017   HDL 61 11/30/2017   LDLCALC 153 (H) 11/30/2017   TRIG 145 11/30/2017   CHOLHDL 4.0 11/30/2017    Significant Diagnostic Results in last 30 days:  No results found.  Assessment/Plan Charnay was seen today for medical management of chronic issues.  Diagnoses and all orders for this visit:  CKD (chronic kidney disease) stage 3, GFR 30-59 ml/min (HCC)  Edema, unspecified type  Gout, unspecified cause, unspecified chronicity, unspecified site   Above listed conditions stable  Continue current medications  Encourage pt interaction with other residents and participation in activites  Monitor for pain  Monitor for edema  Safety precautions   Fall precautions  Family/ staff Communication:   Total Time:  Documentation:  Face to Face:  Family/Phone:   Labs/tests ordered:  Not due. Recent labs reviewed, stable  Medication list reviewed and assessed for continued appropriateness. Monthly medication orders reviewed and signed.  Vikki Ports, NP-C Geriatrics [facility] [phone], [place] [address] Cell Phone (Mon-Fri 8am-5pm):  [phone] On Call:  [phone] & follow prompts after 5pm & weekends Office Phone:  [phone] Office Fax:  [phone]

## 2018-02-19 NOTE — Assessment & Plan Note (Signed)
Stable. No recent exacerbations. 

## 2018-03-01 DIAGNOSIS — H02032 Senile entropion of right lower eyelid: Secondary | ICD-10-CM | POA: Diagnosis not present

## 2018-03-01 DIAGNOSIS — H02042 Spastic entropion of right lower eyelid: Secondary | ICD-10-CM | POA: Diagnosis not present

## 2018-03-15 ENCOUNTER — Ambulatory Visit (INDEPENDENT_AMBULATORY_CARE_PROVIDER_SITE_OTHER): Payer: Medicare Other | Admitting: Internal Medicine

## 2018-03-15 ENCOUNTER — Other Ambulatory Visit: Payer: Self-pay | Admitting: Internal Medicine

## 2018-03-15 ENCOUNTER — Encounter: Payer: Self-pay | Admitting: Internal Medicine

## 2018-03-15 VITALS — BP 110/60 | HR 69 | Temp 98.2°F | Ht 61.0 in | Wt 186.0 lb

## 2018-03-15 DIAGNOSIS — E119 Type 2 diabetes mellitus without complications: Secondary | ICD-10-CM

## 2018-03-15 DIAGNOSIS — R600 Localized edema: Secondary | ICD-10-CM | POA: Diagnosis not present

## 2018-03-15 DIAGNOSIS — L03012 Cellulitis of left finger: Secondary | ICD-10-CM | POA: Diagnosis not present

## 2018-03-15 DIAGNOSIS — R609 Edema, unspecified: Secondary | ICD-10-CM

## 2018-03-15 DIAGNOSIS — Z8639 Personal history of other endocrine, nutritional and metabolic disease: Secondary | ICD-10-CM | POA: Diagnosis not present

## 2018-03-15 LAB — HEMOGLOBIN A1C: HEMOGLOBIN A1C: 6.7 % — AB (ref 4.6–6.5)

## 2018-03-15 LAB — D-DIMER, QUANTITATIVE: D-Dimer, Quant: 0.54 mcg/mL FEU — ABNORMAL HIGH (ref <0.50)

## 2018-03-15 MED ORDER — MUPIROCIN 2 % EX OINT: 1.0000 "application " | TOPICAL_OINTMENT | Freq: Two times a day (BID) | CUTANEOUS | 0 refills | Status: DC

## 2018-03-15 NOTE — Patient Instructions (Addendum)
F/u in 4--6 months   Diabetes Mellitus and Nutrition When you have diabetes (diabetes mellitus), it is very important to have healthy eating habits because your blood sugar (glucose) levels are greatly affected by what you eat and drink. Eating healthy foods in the appropriate amounts, at about the same times every day, can help you:  Control your blood glucose.  Lower your risk of heart disease.  Improve your blood pressure.  Reach or maintain a healthy weight.  Every person with diabetes is different, and each person has different needs for a meal plan. Your health care provider may recommend that you work with a diet and nutrition specialist (dietitian) to make a meal plan that is best for you. Your meal plan may vary depending on factors such as:  The calories you need.  The medicines you take.  Your weight.  Your blood glucose, blood pressure, and cholesterol levels.  Your activity level.  Other health conditions you have, such as heart or kidney disease.  How do carbohydrates affect me? Carbohydrates affect your blood glucose level more than any other type of food. Eating carbohydrates naturally increases the amount of glucose in your blood. Carbohydrate counting is a method for keeping track of how many carbohydrates you eat. Counting carbohydrates is important to keep your blood glucose at a healthy level, especially if you use insulin or take certain oral diabetes medicines. It is important to know how many carbohydrates you can safely have in each meal. This is different for every person. Your dietitian can help you calculate how many carbohydrates you should have at each meal and for snack. Foods that contain carbohydrates include:  Bread, cereal, rice, pasta, and crackers.  Potatoes and corn.  Peas, beans, and lentils.  Milk and yogurt.  Fruit and juice.  Desserts, such as cakes, cookies, ice cream, and candy.  How does alcohol affect me? Alcohol can cause a  sudden decrease in blood glucose (hypoglycemia), especially if you use insulin or take certain oral diabetes medicines. Hypoglycemia can be a life-threatening condition. Symptoms of hypoglycemia (sleepiness, dizziness, and confusion) are similar to symptoms of having too much alcohol. If your health care provider says that alcohol is safe for you, follow these guidelines:  Limit alcohol intake to no more than 1 drink per day for nonpregnant women and 2 drinks per day for men. One drink equals 12 oz of beer, 5 oz of wine, or 1 oz of hard liquor.  Do not drink on an empty stomach.  Keep yourself hydrated with water, diet soda, or unsweetened iced tea.  Keep in mind that regular soda, juice, and other mixers may contain a lot of sugar and must be counted as carbohydrates.  What are tips for following this plan? Reading food labels  Start by checking the serving size on the label. The amount of calories, carbohydrates, fats, and other nutrients listed on the label are based on one serving of the food. Many foods contain more than one serving per package.  Check the total grams (g) of carbohydrates in one serving. You can calculate the number of servings of carbohydrates in one serving by dividing the total carbohydrates by 15. For example, if a food has 30 g of total carbohydrates, it would be equal to 2 servings of carbohydrates.  Check the number of grams (g) of saturated and trans fats in one serving. Choose foods that have low or no amount of these fats.  Check the number of milligrams (mg) of  sodium in one serving. Most people should limit total sodium intake to less than 2,300 mg per day.  Always check the nutrition information of foods labeled as "low-fat" or "nonfat". These foods may be higher in added sugar or refined carbohydrates and should be avoided.  Talk to your dietitian to identify your daily goals for nutrients listed on the label. Shopping  Avoid buying canned, premade, or  processed foods. These foods tend to be high in fat, sodium, and added sugar.  Shop around the outside edge of the grocery store. This includes fresh fruits and vegetables, bulk grains, fresh meats, and fresh dairy. Cooking  Use low-heat cooking methods, such as baking, instead of high-heat cooking methods like deep frying.  Cook using healthy oils, such as olive, canola, or sunflower oil.  Avoid cooking with butter, cream, or high-fat meats. Meal planning  Eat meals and snacks regularly, preferably at the same times every day. Avoid going long periods of time without eating.  Eat foods high in fiber, such as fresh fruits, vegetables, beans, and whole grains. Talk to your dietitian about how many servings of carbohydrates you can eat at each meal.  Eat 4-6 ounces of lean protein each day, such as lean meat, chicken, fish, eggs, or tofu. 1 ounce is equal to 1 ounce of meat, chicken, or fish, 1 egg, or 1/4 cup of tofu.  Eat some foods each day that contain healthy fats, such as avocado, nuts, seeds, and fish. Lifestyle   Check your blood glucose regularly.  Exercise at least 30 minutes 5 or more days each week, or as told by your health care provider.  Take medicines as told by your health care provider.  Do not use any products that contain nicotine or tobacco, such as cigarettes and e-cigarettes. If you need help quitting, ask your health care provider.  Work with a Social worker or diabetes educator to identify strategies to manage stress and any emotional and social challenges. What are some questions to ask my health care provider?  Do I need to meet with a diabetes educator?  Do I need to meet with a dietitian?  What number can I call if I have questions?  When are the best times to check my blood glucose? Where to find more information:  American Diabetes Association: diabetes.org/food-and-fitness/food  Academy of Nutrition and Dietetics:  PokerClues.dk  [employer] of Diabetes and Digestive and Kidney Diseases (NIH): ContactWire.be Summary  A healthy meal plan will help you control your blood glucose and maintain a healthy lifestyle.  Working with a diet and nutrition specialist (dietitian) can help you make a meal plan that is best for you.  Keep in mind that carbohydrates and alcohol have immediate effects on your blood glucose levels. It is important to count carbohydrates and to use alcohol carefully. This information is not intended to replace advice given to you by your health care provider. Make sure you discuss any questions you have with your health care provider. Document Released: 05/05/2005 Document Revised: 09/12/2016 Document Reviewed: 09/12/2016 Elsevier Interactive Patient Education  2018 [place] is an infection of the skin that surrounds a nail. It usually affects the skin around a fingernail, but it may also occur near a toenail. It often causes pain and swelling around the nail. This condition may come on suddenly or develop over a longer period. In some cases, a collection of pus (abscess) can form near or under the nail. Usually, paronychia is not serious and  it clears up with treatment. What are the causes? This condition may be caused by bacteria or fungi. It is commonly caused by either Streptococcus or Staphylococcus bacteria. The bacteria or fungi often cause the infection by getting into the affected area through an opening in the skin, such as a cut or a hangnail. What increases the risk? This condition is more likely to develop in:  People who get their hands wet often, such as those who work as Designer, industrial/product, bartenders, or nurses.  People who bite their fingernails or suck their thumbs.  People who trim their nails too short.  People who  have hangnails or injured fingertips.  People who get manicures.  People who have diabetes.  What are the signs or symptoms? Symptoms of this condition include:  Redness and swelling of the skin near the nail.  Tenderness around the nail when you touch the area.  Pus-filled bumps under the cuticle. The cuticle is the skin at the base or sides of the nail.  Fluid or pus under the nail.  Throbbing pain in the area.  How is this diagnosed? This condition is usually diagnosed with a physical exam. In some cases, a sample of pus may be taken from an abscess to be tested in a lab. This can help to determine what type of bacteria or fungi is causing the condition. How is this treated? Treatment for this condition depends on the cause and severity of the condition. If the condition is mild, it may clear up on its own in a few days. Your health care provider may recommend soaking the affected area in warm water a few times a day. When treatment is needed, the options may include:  Antibiotic medicine, if the condition is caused by a bacterial infection.  Antifungal medicine, if the condition is caused by a fungal infection.  Incision and drainage, if an abscess is present. In this procedure, the health care provider will cut open the abscess so the pus can drain out.  Follow these instructions at home:  Soak the affected area in warm water if directed to do so by your health care provider. You may be told to do this for 20 minutes, 2-3 times a day. Keep the area dry in between soakings.  Take medicines only as directed by your health care provider.  If you were prescribed an antibiotic medicine, finish all of it even if you start to feel better.  Keep the affected area clean.  Do not try to drain a fluid-filled bump yourself.  If you will be washing dishes or performing other tasks that require your hands to get wet, wear rubber gloves. You should also wear gloves if your hands might  come in contact with irritating substances, such as cleaners or chemicals.  Follow your health care provider's instructions about: ? Wound care. ? Bandage (dressing) changes and removal. Contact a health care provider if:  Your symptoms get worse or do not improve with treatment.  You have a fever or chills.  You have redness spreading from the affected area.  You have continued or increased fluid, blood, or pus coming from the affected area.  Your finger or knuckle becomes swollen or is difficult to move. This information is not intended to replace advice given to you by your health care provider. Make sure you discuss any questions you have with your health care provider. Document Released: 02/01/2001 Document Revised: 01/14/2016 Document Reviewed: 07/16/2014 Elsevier Interactive Patient Education  [employer].

## 2018-03-15 NOTE — Progress Notes (Signed)
Pre visit review using our clinic review tool, if applicable. No additional management support is needed unless otherwise documented below in the visit note. 

## 2018-03-15 NOTE — Progress Notes (Signed)
Chief Complaint  Patient presents with  . Follow-up   F/u with sister Mae  1. Leg edema sl improved D dimer never done will do today she is wearing compression stockings and taking torsemide 10 mg qd prn  2. Right eye surgery for ectropian sch 04/19/18  3. DM 2 last A1C 5.4 am cbgs 171 and 200 after food will repeat A1C today last donw 11/2017    Review of Systems  Constitutional: Negative for weight loss.  HENT: Negative for hearing loss.   Eyes: Negative for blurred vision.  Respiratory: Negative for shortness of breath.   Cardiovascular: Positive for leg swelling. Negative for chest pain.  Skin: Negative for rash.   Past Medical History:  Diagnosis Date  . Arthritis   . Cancer ([place])    skin mohs left ear ? 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 Colon polyps   . Gastric ulcer   . GERD (gastroesophageal reflux disease)   . Hyperlipidemia   . Hypertension   . Rotator cuff disorder    right  . Shingles    Past Surgical History:  Procedure Laterality Date  . KNEE ARTHROSCOPY     b/l    Family History  Problem Relation Age of Onset  . Cancer Mother        ?colon   . Diabetes Mother   . Early death Mother   . Heart disease Mother   . Hypertension Mother   . Hyperlipidemia Mother   . Cancer Father        ? MM  . Diabetes Sister   . Diabetes Brother   . Arthritis Brother    Social History   Socioeconomic History  . Marital status: Single    Spouse name: Not on file  . Number of children: Not on file  . Years of education: Not on file  . Highest education level: Not on file  Occupational History  . Occupation: retired  Scientific laboratory technician  . Financial resource strain: Not on file  . Food insecurity:    Worry: Not on file    Inability: Not on file  . Transportation needs:    Medical: Not on file    Non-medical: Not on file  Tobacco Use  . Smoking status: Never Smoker  . Smokeless tobacco: Never Used  Substance and Sexual Activity  . Alcohol  use: Yes  . Drug use: No  . Sexual activity: Never  Lifestyle  . Physical activity:    Days per week: Not on file    Minutes per session: Not on file  . Stress: Not on file  Relationships  . Social connections:    Talks on phone: Not on file    Gets together: Not on file    Attends religious service: Not on file    Active member of club or organization: Not on file    Attends meetings of clubs or organizations: Not on file    Relationship status: Not on file  . Intimate partner violence:    Fear of current or ex partner: Not on file    Emotionally abused: Not on file    Physically abused: Not on file    Forced sexual activity: Not on file  Other Topics Concern  . Not on file  Social History Narrative   Retired former Lawyer, assoc. Degree    Moved from [place]    Wears seat belts, no guns at home  No exercise    Code status DNR   Current Meds  Medication Sig  . acetaminophen (TYLENOL) 325 MG tablet Take 650 mg by mouth every 4 (four) hours as needed. for pain and/or increased temp. May be administered orally (via gastric tube if present), or rectally - if patient unable to take orally.  . Cholecalciferol (VITAMIN D3) 2000 units TABS Take 2,000 Units by mouth daily.  . citalopram (CELEXA) 10 MG tablet Take 1 tablet (10 mg total) by mouth daily.  . cyanocobalamin 1000 MCG tablet Take 1,000 mcg by mouth daily.  Marland Kitchen ipratropium (ATROVENT) 0.03 % nasal spray Place 2 sprays into both nostrils 2 (two) times daily.  . Lidocaine 4 % PTCH Apply 1 patch topically daily. Right Ribs/ Flank. Remove after 12 hours  . Magnesium Oxide 250 MG TABS Take 1 tablet by mouth daily.  Marland Kitchen torsemide (DEMADEX) 10 MG tablet Take 1 tablet (10 mg total) by mouth daily.  . traZODone (DESYREL) 50 MG tablet Take 50 mg by mouth at bedtime.   Allergies  Allergen Reactions  . Penicillins     ?rxn   . Statins     ? Rxn cant tolerate crestor and lipitor noted prev records.    Recent  Results (from the past 2160 hour(s))  Basic metabolic panel     Status: Abnormal   Collection Time: 01/26/18 12:15 PM  Result Value Ref Range   Sodium 138 135 - 145 mmol/L   Potassium 4.3 3.5 - 5.1 mmol/L   Chloride 99 (L) 101 - 111 mmol/L   CO2 30 22 - 32 mmol/L   Glucose, Bld 95 65 - 99 mg/dL   BUN 15 6 - 20 mg/dL   Creatinine, Ser 1.12 (H) 0.44 - 1.00 mg/dL   Calcium 9.1 8.9 - 10.3 mg/dL   GFR calc non Af Amer 43 (L) >60 mL/min   GFR calc Af Amer 49 (L) >60 mL/min    Comment: (NOTE) The eGFR has been calculated using the CKD EPI equation. This calculation has not been validated in all clinical situations. eGFR's persistently <60 mL/min signify possible Chronic Kidney Disease.    Anion gap 9 5 - 15    Comment: Performed at [facility], [address] [place], [place] [address]   Objective  Body mass index is 35.14 kg/m. Wt Readings from Last 3 Encounters:  03/15/18 186 lb (84.4 kg)  02/06/18 185 lb 3.2 oz (84 kg)  01/25/18 184 lb (83.5 kg)   Temp Readings from Last 3 Encounters:  03/15/18 98.2 F (36.8 C) (Oral)  02/06/18 (!) 96.6 F (35.9 C) (Oral)  01/25/18 98.5 F (36.9 C) (Oral)   BP Readings from Last 3 Encounters:  03/15/18 110/60  02/06/18 (!) 141/59  01/25/18 130/72   Pulse Readings from Last 3 Encounters:  03/15/18 69  02/06/18 64  01/25/18 (!) 59    Physical Exam  Constitutional: She is oriented to person, place, and time. Vital signs are normal. She appears well-developed and well-nourished. She is cooperative.  HENT:  Head: Normocephalic and atraumatic.  Mouth/Throat: Oropharynx is clear and moist and mucous membranes are normal.  Eyes: Pupils are equal, round, and reactive to light. Conjunctivae are normal.  Cardiovascular: Normal rate, regular rhythm and normal heart sounds.  1+ edema b/l legs   Pulmonary/Chest: Effort normal and breath sounds normal.  Neurological: She is alert and oriented to person, place, and time. Gait  normal.  Skin: Skin is warm, dry and intact.  Psychiatric:  She has a normal mood and affect. Her speech is normal and behavior is normal. Judgment and thought content normal. Cognition and memory are normal.  Nursing note and vitals reviewed.   Assessment   1. Leg edema  2. Right eye ectropion  3. DM 2 last A1C 5.4  4. HM  5. Paronychia left 5th nail  Plan   1. Cont compression stockings torsemide 10 mg qd d dimer today if + [place] legs b/l  2.  Eye surgery 04/19/18  3.  Check A1C today  4.  Flu 04/27/17 Tdap never declines  pna vx 1/2 ? Which one in 2017  Declines shingrix  Pending records old PCP Dr. Cor[facility]e [facility] including vaccines, labs, prior mammograms, colonoscopies if present   Last colonoscopy several years ago h/o polyps no further testing other than fobt  Out of age window pap LMP 23 years ago  DEXA out of age window Mammogram out of age window  Refer to dermatology Dr. Anthoney Harada h/o skin cancer SCC, BCC saw recently 2019  LN2 will f/u in 2020 5. Warm soaks and bactroban   Provider: Dr. Olivia Mackie [facility]

## 2018-03-16 ENCOUNTER — Ambulatory Visit
Admission: RE | Admit: 2018-03-16 | Discharge: 2018-03-16 | Disposition: A | Discharge status: Home / Self Care | Payer: Medicare Other | Source: Ambulatory Visit | Attending: Internal Medicine | Admitting: Internal Medicine

## 2018-03-16 DIAGNOSIS — R600 Localized edema: Secondary | ICD-10-CM | POA: Diagnosis not present

## 2018-03-16 DIAGNOSIS — R7989 Other specified abnormal findings of blood chemistry: Secondary | ICD-10-CM | POA: Insufficient documentation

## 2018-03-19 ENCOUNTER — Telehealth: Payer: Self-pay

## 2018-03-19 NOTE — Telephone Encounter (Signed)
Patients sister was informed of results.  Patients sister understood and no questions, comments, or concerns at this time.

## 2018-03-19 NOTE — Telephone Encounter (Signed)
Copied from [place] [phone]. Topic: Inquiry >> Mar 19, 2018  1:13 PM Conception Chancy, NT wrote: Reason for CRM: Carmon Ginsberg patients sister is calling to get the patients results from her ultrasound. Please advise.

## 2018-03-22 ENCOUNTER — Encounter
Admission: RE | Admit: 2018-03-22 | Discharge: 2018-03-22 | Disposition: A | Discharge status: Home / Self Care | Payer: Medicare Other | Source: Ambulatory Visit | Attending: Internal Medicine | Admitting: Internal Medicine

## 2018-04-02 ENCOUNTER — Non-Acute Institutional Stay: Payer: Medicare Other | Admitting: Adult Health

## 2018-04-02 ENCOUNTER — Encounter: Payer: Self-pay | Admitting: Adult Health

## 2018-04-02 DIAGNOSIS — I10 Essential (primary) hypertension: Secondary | ICD-10-CM

## 2018-04-02 DIAGNOSIS — F322 Major depressive disorder, single episode, severe without psychotic features: Secondary | ICD-10-CM

## 2018-04-02 DIAGNOSIS — J309 Allergic rhinitis, unspecified: Secondary | ICD-10-CM | POA: Diagnosis not present

## 2018-04-02 DIAGNOSIS — R600 Localized edema: Secondary | ICD-10-CM | POA: Diagnosis not present

## 2018-04-02 DIAGNOSIS — R0781 Pleurodynia: Secondary | ICD-10-CM | POA: Diagnosis not present

## 2018-04-02 DIAGNOSIS — N183 Chronic kidney disease, stage 3 unspecified: Secondary | ICD-10-CM

## 2018-04-02 NOTE — Progress Notes (Signed)
Location:   [facility] Room Number: [id number] Place of Service:  ALF (13)   CODE STATUS: DNR  Allergies  Allergen Reactions  . Penicillins     ?rxn   . Statins     ? Rxn cant tolerate crestor and lipitor noted prev records.     Chief Complaint  Patient presents with  . Medical Management of Chronic Issues    Hypertension; ckd stage 3; bilateral lower extremity     HPI:  She is a 84 year old assisted living resident being seen for the management of her chronic illnesses: hypertension; ckd stage 3; and edema. She denies any edema; she denies any headache or vertigo. She does have chronic right rib pain; her current treatment is effective. There are no nursing concerns at this time.   Past Medical History:  Diagnosis Date  . Arthritis   . Cancer ([place])    skin mohs left ear ? 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 Colon polyps   . Gastric ulcer   . GERD (gastroesophageal reflux disease)   . Hyperlipidemia   . Hypertension   . Rotator cuff disorder    right  . Shingles     Past Surgical History:  Procedure Laterality Date  . KNEE ARTHROSCOPY     b/l     Social History   Socioeconomic History  . Marital status: Single    Spouse name: Not on file  . Number of children: Not on file  . Years of education: Not on file  . Highest education level: Not on file  Occupational History  . Occupation: retired  Scientific laboratory technician  . Financial resource strain: Not on file  . Food insecurity:    Worry: Not on file    Inability: Not on file  . Transportation needs:    Medical: Not on file    Non-medical: Not on file  Tobacco Use  . Smoking status: Never Smoker  . Smokeless tobacco: Never Used  Substance and Sexual Activity  . Alcohol use: Yes  . Drug use: No  . Sexual activity: Never  Lifestyle  . Physical activity:    Days per week: Not on file    Minutes per session: Not on file  . Stress: Not on file  Relationships  .  Social connections:    Talks on phone: Not on file    Gets together: Not on file    Attends religious service: Not on file    Active member of club or organization: Not on file    Attends meetings of clubs or organizations: Not on file    Relationship status: Not on file  . Intimate partner violence:    Fear of current or ex partner: Not on file    Emotionally abused: Not on file    Physically abused: Not on file    Forced sexual activity: Not on file  Other Topics Concern  . Not on file  Social History Narrative   Retired former Lawyer, assoc. Degree    Moved from [place]    Wears seat belts, no guns at home    No exercise    Code status DNR   Family History  Problem Relation Age of Onset  . Cancer Mother        ?colon   . Diabetes Mother   . Early death Mother   . Heart disease Mother   . Hypertension  Mother   . Hyperlipidemia Mother   . Cancer Father        ? MM  . Diabetes Sister   . Diabetes Brother   . Arthritis Brother       VITAL SIGNS BP 121/61   Pulse 64   Temp (!) 96.8 F (36 C) (Oral)   Resp 18   Ht 5\' 1"  (1.549 m)   Wt 185 lb 4.8 oz (84.1 kg)   SpO2 94%   BMI 35.01 kg/m   Outpatient Encounter Medications as of 04/02/2018  Medication Sig  . acetaminophen (TYLENOL) 325 MG tablet Take 650 mg by mouth every 4 (four) hours as needed. for pain and/or increased temp. May be administered orally (via gastric tube if present), or rectally - if patient unable to take orally.  . Cholecalciferol (VITAMIN D3) 2000 units TABS Take 2,000 Units by mouth daily.  . citalopram (CELEXA) 10 MG tablet Take 1 tablet (10 mg total) by mouth daily.  . cyanocobalamin 1000 MCG tablet Take 1,000 mcg by mouth daily.  Marland Kitchen ipratropium (ATROVENT) 0.03 % nasal spray Place 2 sprays into both nostrils 2 (two) times daily.  . Lidocaine 4 % PTCH Apply 1 patch topically daily. Right Ribs/ Flank. Remove after 12 hours  . Magnesium Oxide 250 MG TABS Take 1 tablet by mouth  daily.  Marland Kitchen torsemide (DEMADEX) 10 MG tablet Take 1 tablet (10 mg total) by mouth daily.  . traZODone (DESYREL) 50 MG tablet Take 50 mg by mouth at bedtime.  Marland Kitchen UNABLE TO FIND Diet - Liquids: _X__Regular;  . [DISCONTINUED] mupirocin ointment (BACTROBAN) 2 % Apply 1 application topically 2 (two) times daily. X 1 week to 2 weeks   No facility-administered encounter medications on file as of 04/02/2018.      SIGNIFICANT DIAGNOSTIC EXAMS  LABS REVIEWED: TODAY:   11-30-17; wbc 7.7; hgb 11.2; hct 33.3; mcv 97.6; plt 300; glucose 108; bun 19; creat 1.18; k+ 3.6; na++ 140; ca 9.4; liver normal albumin 3.3; mag 1.4 chol 243; ldl 153; trig 145; hdl 61; tsh 2.763 vit B 12: 382; vit D 37.1 01-26-18: glucose 95; bun 15; creat 1.12; k+ 4.3; na++ 138; ca 9.1 03-15-18: hgb a1c 6.7 d-dimer 0.54   Review of Systems  Constitutional: Negative for malaise/fatigue.  Respiratory: Negative for cough and shortness of breath.   Cardiovascular: Negative for chest pain, palpitations and leg swelling.  Gastrointestinal: Negative for abdominal pain, constipation and heartburn.  Musculoskeletal: Positive for myalgias. Negative for back pain and joint pain.       Has chronic right flank pain   Skin: Negative.   Neurological: Negative for dizziness.  Psychiatric/Behavioral: The patient is not nervous/anxious.    Physical Exam  Constitutional: She is oriented to person, place, and time. She appears well-developed and well-nourished. No distress.  Neck: No thyromegaly present.  Cardiovascular: Normal rate, regular rhythm, normal heart sounds and intact distal pulses.  Pulmonary/Chest: Effort normal and breath sounds normal. No respiratory distress.  Abdominal: Soft. Bowel sounds are normal. She exhibits no distension. There is no tenderness.  Musculoskeletal: Normal range of motion. She exhibits no edema.  Lymphadenopathy:    She has no cervical adenopathy.  Neurological: She is alert and oriented to person, place, and  time.  Skin: Skin is warm and dry. She is not diaphoretic.  Psychiatric: She has a normal mood and affect.     ASSESSMENT/ PLAN:  TODAY:   1. Essential benign hypertension: is stable b/p 121/61: is stable will  monitor  2. CKD (chronic kidney disease) stage 3 GFR 30-59 mL/min: is stable bun 15; creat 1.12  3. Bilateral lower extremity edema: is stable will continue demadex 10 mg daily   4. 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 Chronic allergic rhinitis: is stable will continue atrovent nasal spray twice daily   6. Right side rib pain: is stable will continue lidoderm 4% patch daily   She needs eye exam foot doctor exam; and prevnar 13 vaccine    MD is aware of resident's narcotic use and is in agreement with current plan of care. We will attempt to wean resident as apropriate   Ok Edwards NP [facility]  Contact [phone] Monday through Friday 8am- 5pm  After hours call [phone]

## 2018-04-12 DIAGNOSIS — R600 Localized edema: Secondary | ICD-10-CM | POA: Insufficient documentation

## 2018-04-12 DIAGNOSIS — I10 Essential (primary) hypertension: Secondary | ICD-10-CM

## 2018-04-12 DIAGNOSIS — J309 Allergic rhinitis, unspecified: Secondary | ICD-10-CM | POA: Insufficient documentation

## 2018-04-12 HISTORY — DX: Essential (primary) hypertension: I10

## 2018-04-19 DIAGNOSIS — H02032 Senile entropion of right lower eyelid: Secondary | ICD-10-CM | POA: Diagnosis not present

## 2018-04-22 ENCOUNTER — Encounter
Admission: RE | Admit: 2018-04-22 | Discharge: 2018-04-22 | Disposition: A | Discharge status: Home / Self Care | Payer: Medicare Other | Source: Ambulatory Visit | Attending: Internal Medicine | Admitting: Internal Medicine

## 2018-05-03 DIAGNOSIS — Z0000 Encounter for general adult medical examination without abnormal findings: Secondary | ICD-10-CM | POA: Diagnosis not present

## 2018-05-03 DIAGNOSIS — E119 Type 2 diabetes mellitus without complications: Secondary | ICD-10-CM | POA: Diagnosis not present

## 2018-05-03 DIAGNOSIS — E7800 Pure hypercholesterolemia, unspecified: Secondary | ICD-10-CM | POA: Diagnosis not present

## 2018-05-03 DIAGNOSIS — F325 Major depressive disorder, single episode, in full remission: Secondary | ICD-10-CM | POA: Diagnosis not present

## 2018-05-03 DIAGNOSIS — Z593 Problems related to living in residential institution: Secondary | ICD-10-CM | POA: Diagnosis not present

## 2018-05-03 DIAGNOSIS — I10 Essential (primary) hypertension: Secondary | ICD-10-CM | POA: Diagnosis not present

## 2018-05-22 ENCOUNTER — Encounter
Admission: RE | Admit: 2018-05-22 | Discharge: 2018-05-22 | Disposition: A | Discharge status: Home / Self Care | Payer: Medicare Other | Source: Ambulatory Visit | Attending: Internal Medicine | Admitting: Internal Medicine

## 2018-05-29 ENCOUNTER — Encounter: Payer: Self-pay | Admitting: Adult Health

## 2018-05-29 ENCOUNTER — Non-Acute Institutional Stay: Payer: Medicare Other | Admitting: Adult Health

## 2018-05-29 DIAGNOSIS — N183 Chronic kidney disease, stage 3 unspecified: Secondary | ICD-10-CM

## 2018-05-29 DIAGNOSIS — R600 Localized edema: Secondary | ICD-10-CM

## 2018-05-29 DIAGNOSIS — F322 Major depressive disorder, single episode, severe without psychotic features: Secondary | ICD-10-CM | POA: Diagnosis not present

## 2018-05-29 NOTE — Progress Notes (Signed)
[facility]   [facility] Room Number: [place] of Service:  ALF (13)   CODE STATUS: DNR  Allergies  Allergen Reactions  . Penicillins     ?rxn   . Statins     ? Rxn cant tolerate crestor and lipitor noted prev records.     Chief Complaint  Patient presents with  . Medical Management of Chronic Issues    Lower extremity edema; ckd stage 3; depression.     HPI:  She is a 84 year old long term resident of assisted living being seen for the management of chronic illnesses: lower extremity edema; ckd stage 3; depression. She denies any worsening edema; no feelings of depression or anxiety; no uncontrolled pain.   Past Medical History:  Diagnosis Date  . Arthritis   . Cancer ([place])    skin mohs left ear ? 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 Colon polyps   . Gastric ulcer   . GERD (gastroesophageal reflux disease)   . Hyperlipidemia   . Hypertension   . Rotator cuff disorder    right  . Shingles     Past Surgical History:  Procedure Laterality Date  . KNEE ARTHROSCOPY     b/l     Social History   Socioeconomic History  . Marital status: Single    Spouse name: Not on file  . Number of children: Not on file  . Years of education: Not on file  . Highest education level: Not on file  Occupational History  . Occupation: retired  Scientific laboratory technician  . Financial resource strain: Not on file  . Food insecurity:    Worry: Not on file    Inability: Not on file  . Transportation needs:    Medical: Not on file    Non-medical: Not on file  Tobacco Use  . Smoking status: Never Smoker  . Smokeless tobacco: Never Used  Substance and Sexual Activity  . Alcohol use: Yes  . Drug use: No  . Sexual activity: Never  Lifestyle  . Physical activity:    Days per week: Not on file    Minutes per session: Not on file  . Stress: Not on file  Relationships  . Social connections:    Talks on phone: Not on file    Gets together: Not  on file    Attends religious service: Not on file    Active member of club or organization: Not on file    Attends meetings of clubs or organizations: Not on file    Relationship status: Not on file  . Intimate partner violence:    Fear of current or ex partner: Not on file    Emotionally abused: Not on file    Physically abused: Not on file    Forced sexual activity: Not on file  Other Topics Concern  . Not on file  Social History Narrative   Retired former Lawyer, assoc. Degree    Moved from [place]    Wears seat belts, no guns at home    No exercise    Code status DNR   Family History  Problem Relation Age of Onset  . Cancer Mother        ?colon   . Diabetes Mother   . Early death Mother   . Heart disease Mother   . Hypertension Mother   . Hyperlipidemia Mother   . Cancer Father        ?  MM  . Diabetes Sister   . Diabetes Brother   . Arthritis Brother       VITAL SIGNS BP 132/62   Pulse 62   Temp (!) 96.6 F (35.9 C)   Resp 18   Ht 5\' 1"  (1.549 m)   Wt 191 lb 11.2 oz (87 kg)   SpO2 100%   BMI 36.22 kg/m   Outpatient Encounter Medications as of 05/29/2018  Medication Sig  . acetaminophen (TYLENOL) 325 MG tablet Take 650 mg by mouth every 4 (four) hours as needed. for pain and/or increased temp. May be administered orally (via gastric tube if present), or rectally - if patient unable to take orally.  . Cholecalciferol (VITAMIN D3) 2000 units TABS Take 2,000 Units by mouth daily.  . citalopram (CELEXA) 10 MG tablet Take 1 tablet (10 mg total) by mouth daily.  . cyanocobalamin 1000 MCG tablet Take 1,000 mcg by mouth daily.  Marland Kitchen ipratropium (ATROVENT) 0.03 % nasal spray Place 2 sprays into both nostrils 2 (two) times daily.  . Lidocaine 4 % PTCH Apply 1 patch topically daily. Right Ribs/ Flank. Remove after 12 hours  . Magnesium Oxide 250 MG TABS Take 1 tablet by mouth daily.  Marland Kitchen torsemide (DEMADEX) 10 MG tablet Take 1 tablet (10 mg total) by  mouth daily.  . traZODone (DESYREL) 50 MG tablet Take 50 mg by mouth at bedtime.  Marland Kitchen UNABLE TO FIND Diet - Liquids: _X__Regular;   No facility-administered encounter medications on file as of 05/29/2018.      SIGNIFICANT DIAGNOSTIC EXAMS  LABS REVIEWED: PREVIOUS:   11-30-17; wbc 7.7; hgb 11.2; hct 33.3; mcv 97.6; plt 300; glucose 108; bun 19; creat 1.18; k+ 3.6; na++ 140; ca 9.4; liver normal albumin 3.3; mag 1.4 chol 243; ldl 153; trig 145; hdl 61; tsh 2.763 vit B 12: 382; vit D 37.1 01-26-18: glucose 95; bun 15; creat 1.12; k+ 4.3; na++ 138; ca 9.1 03-15-18: hgb a1c 6.7 d-dimer 0.54   NO NEW LABS.    Review of Systems  Constitutional: Negative for malaise/fatigue.  Respiratory: Negative for cough and shortness of breath.   Cardiovascular: Negative for chest pain, palpitations and leg swelling.  Gastrointestinal: Negative for abdominal pain, constipation and heartburn.  Musculoskeletal: Negative for back pain, joint pain and myalgias.  Skin: Negative.   Neurological: Negative for dizziness.  Psychiatric/Behavioral: The patient is not nervous/anxious.     Physical Exam  Constitutional: She is oriented to person, place, and time. She appears well-developed and well-nourished. No distress.  Neck: No thyromegaly present.  Cardiovascular: Normal rate, regular rhythm, normal heart sounds and intact distal pulses.  Pulmonary/Chest: Effort normal and breath sounds normal. No respiratory distress.  Abdominal: Soft. Bowel sounds are normal. She exhibits no distension. There is no tenderness.  Musculoskeletal: Normal range of motion. She exhibits no edema.  Lymphadenopathy:    She has no cervical adenopathy.  Neurological: She is alert and oriented to person, place, and time.  Skin: Skin is warm and dry. She is not diaphoretic.  Psychiatric: She has a normal mood and affect.     ASSESSMENT/ PLAN:  TODAY:   1. Essential benign hypertension: is stable b/p 132/62: is stable will  monitor  2. CKD (chronic kidney disease) stage 3 GFR 30-59 mL/min: is stable bun 15; creat 1.12  3. Bilateral lower extremity edema: is stable will continue demadex 10 mg daily   PREVIOUS   4. Severe major depression, single episode without psychotic features: is stable will  continue celexa 10 mg daily and trazodone 50 mg nightly   5. Chronic allergic rhinitis: is stable will continue atrovent nasal spray twice daily   6. Right side rib pain: is stable will continue lidoderm 4% patch daily   7. GERD: is stable will monitor     MD is aware of resident's narcotic use and is in agreement with current plan of care. We will attempt to wean resident as apropriate   Ok Edwards NP [facility]  Contact [phone] Monday through Friday 8am- 5pm  After hours call [phone]

## 2018-06-03 ENCOUNTER — Encounter: Payer: Self-pay | Admitting: Adult Health

## 2018-06-22 ENCOUNTER — Encounter
Admission: RE | Admit: 2018-06-22 | Discharge: 2018-06-22 | Disposition: A | Discharge status: Home / Self Care | Payer: Medicare Other | Source: Ambulatory Visit | Attending: Internal Medicine | Admitting: Internal Medicine

## 2018-07-10 ENCOUNTER — Ambulatory Visit (INDEPENDENT_AMBULATORY_CARE_PROVIDER_SITE_OTHER): Payer: Medicare Other | Admitting: Internal Medicine

## 2018-07-10 ENCOUNTER — Encounter: Payer: Self-pay | Admitting: Internal Medicine

## 2018-07-10 VITALS — BP 126/60 | HR 77 | Temp 98.5°F | Ht 61.0 in | Wt 192.2 lb

## 2018-07-10 DIAGNOSIS — L739 Follicular disorder, unspecified: Secondary | ICD-10-CM

## 2018-07-10 DIAGNOSIS — R600 Localized edema: Secondary | ICD-10-CM | POA: Diagnosis not present

## 2018-07-10 DIAGNOSIS — E119 Type 2 diabetes mellitus without complications: Secondary | ICD-10-CM

## 2018-07-10 DIAGNOSIS — Z1329 Encounter for screening for other suspected endocrine disorder: Secondary | ICD-10-CM | POA: Diagnosis not present

## 2018-07-10 DIAGNOSIS — Z8639 Personal history of other endocrine, nutritional and metabolic disease: Secondary | ICD-10-CM | POA: Diagnosis not present

## 2018-07-10 DIAGNOSIS — Z1389 Encounter for screening for other disorder: Secondary | ICD-10-CM | POA: Diagnosis not present

## 2018-07-10 DIAGNOSIS — Z0000 Encounter for general adult medical examination without abnormal findings: Secondary | ICD-10-CM

## 2018-07-10 DIAGNOSIS — R7303 Prediabetes: Secondary | ICD-10-CM

## 2018-07-10 DIAGNOSIS — E785 Hyperlipidemia, unspecified: Secondary | ICD-10-CM | POA: Diagnosis not present

## 2018-07-10 DIAGNOSIS — M2341 Loose body in knee, right knee: Secondary | ICD-10-CM

## 2018-07-10 LAB — POCT GLYCOSYLATED HEMOGLOBIN (HGB A1C): Hemoglobin A1C: 6.4 % — AB (ref 4.0–5.6)

## 2018-07-10 MED ORDER — CLINDAMYCIN PHOSPHATE 1 % EX LOTN: TOPICAL_LOTION | Freq: Two times a day (BID) | CUTANEOUS | 0 refills | Status: DC

## 2018-07-10 NOTE — Progress Notes (Signed)
Chief Complaint  Patient presents with  . Follow-up   F/u with sister [facility] [place]  1. A1C 6.7 03/15/18 off meds will recheck today POC 6.4 today cbgs running 95 to 225 at ALF 2. C/o lesion in right knee hard w/o pain or sx's and wants to know what it is  3. C/o left cheek lesion sore x 1 year had f/u dermatology 01/2018 and they did not comment on lesion  4. B/l leg edema stable with compression stockings on today stable on torsemide 10 mg qd prn   Review of Systems  Constitutional: Negative for weight loss.  HENT: Negative for hearing loss.   Eyes: Negative for blurred vision.  Respiratory: Negative for shortness of breath.   Cardiovascular: Positive for leg swelling. Negative for chest pain.  Gastrointestinal: Negative for abdominal pain.  Musculoskeletal: Negative for falls.  Skin: Negative for rash.  Neurological: Negative for headaches.  Psychiatric/Behavioral: Negative for depression.   Past Medical History:  Diagnosis Date  . Arthritis   . Benign essential hypertension 04/12/2018  . Cancer ([place])    skin mohs left ear ? 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 Colon polyps   . Gastric ulcer   . GERD (gastroesophageal reflux disease)   . Hyperlipidemia   . Hypertension   . Rotator cuff disorder    right  . Shingles    Past Surgical History:  Procedure Laterality Date  . KNEE ARTHROSCOPY     b/l    Family History  Problem Relation Age of Onset  . Cancer Mother        ?colon   . Diabetes Mother   . Early death Mother   . Heart disease Mother   . Hypertension Mother   . Hyperlipidemia Mother   . Cancer Father        ? MM  . Diabetes Sister   . Diabetes Brother   . Arthritis Brother    Social History   Socioeconomic History  . Marital status: Single    Spouse name: Not on file  . Number of children: Not on file  . Years of education: Not on file  . Highest education level: Not on file  Occupational History  .  Occupation: retired  Scientific laboratory technician  . Financial resource strain: Not on file  . Food insecurity:    Worry: Not on file    Inability: Not on file  . Transportation needs:    Medical: Not on file    Non-medical: Not on file  Tobacco Use  . Smoking status: Never Smoker  . Smokeless tobacco: Never Used  Substance and Sexual Activity  . Alcohol use: Yes  . Drug use: No  . Sexual activity: Never  Lifestyle  . Physical activity:    Days per week: Not on file    Minutes per session: Not on file  . Stress: Not on file  Relationships  . Social connections:    Talks on phone: Not on file    Gets together: Not on file    Attends religious service: Not on file    Active member of club or organization: Not on file    Attends meetings of clubs or organizations: Not on file    Relationship status: Not on file  . Intimate partner violence:    Fear of current or ex partner: Not on file    Emotionally abused: Not on file    Physically abused:  Not on file    Forced sexual activity: Not on file  Other Topics Concern  . Not on file  Social History Narrative   Retired former Lawyer, assoc. Degree    Moved from [place]    Wears seat belts, no guns at home    No exercise    Code status DNR   Current Meds  Medication Sig  . acetaminophen (TYLENOL) 325 MG tablet Take 650 mg by mouth every 4 (four) hours as needed. for pain and/or increased temp. May be administered orally (via gastric tube if present), or rectally - if patient unable to take orally.  . Cholecalciferol (VITAMIN D3) 2000 units TABS Take 2,000 Units by mouth daily.  . citalopram (CELEXA) 10 MG tablet Take 1 tablet (10 mg total) by mouth daily.  . cyanocobalamin 1000 MCG tablet Take 1,000 mcg by mouth daily.  Marland Kitchen ipratropium (ATROVENT) 0.03 % nasal spray Place 2 sprays into both nostrils 2 (two) times daily.  . Lidocaine 4 % PTCH Apply 1 patch topically daily. Right Ribs/ Flank. Remove after 12 hours  . Magnesium  Oxide 250 MG TABS Take 1 tablet by mouth daily.  Marland Kitchen torsemide (DEMADEX) 10 MG tablet Take 1 tablet (10 mg total) by mouth daily.  . traZODone (DESYREL) 50 MG tablet Take 50 mg by mouth at bedtime.  Marland Kitchen UNABLE TO FIND Diet - Liquids: _X__Regular;   Allergies  Allergen Reactions  . Penicillins     ?rxn   . Statins     ? Rxn cant tolerate crestor and lipitor noted prev records.    No results found for this or any previous visit (from the past 2160 hour(s)). Objective  Body mass index is 36.32 kg/m. Wt Readings from Last 3 Encounters:  07/10/18 192 lb 3.2 oz (87.2 kg)  05/29/18 191 lb 11.2 oz (87 kg)  04/02/18 185 lb 4.8 oz (84.1 kg)   Temp Readings from Last 3 Encounters:  07/10/18 98.5 F (36.9 C) (Oral)  05/29/18 (!) 96.6 F (35.9 C)  04/02/18 (!) 96.8 F (36 C) (Oral)   BP Readings from Last 3 Encounters:  07/10/18 126/60  05/29/18 132/62  04/02/18 121/61   Pulse Readings from Last 3 Encounters:  07/10/18 77  05/29/18 62  04/02/18 64    Physical Exam  Constitutional: She is oriented to person, place, and time. Vital signs are normal. She appears well-developed and well-nourished. She is cooperative.  HENT:  Head: Normocephalic and atraumatic.  Mouth/Throat: Oropharynx is clear and moist and mucous membranes are normal.  Eyes: Pupils are equal, round, and reactive to light. Conjunctivae are normal.  Cardiovascular: Normal rate, regular rhythm and normal heart sounds.  Pulmonary/Chest: Effort normal and breath sounds normal.  Musculoskeletal:       Legs: Neurological: She is alert and oriented to person, place, and time. Gait normal.  Skin: Skin is warm, dry and intact.  Psychiatric: She has a normal mood and affect. Her speech is normal and behavior is normal. Judgment and thought content normal. Cognition and memory are normal.  Nursing note and vitals reviewed.   Assessment   1. DM 2 A1C 6.4, h/o HLD  2. Right knee likely bone loose body w/o sx's 0.5 cm  3.  Left cheek lesion folliculitis vs blackhead vs milia  4. Leg edema stable lymphedema  5. HM Plan   1. Monitor cbgs  Goal A1C <8.0 off meds for now  If ever needed consider add back low dose [place]  Declines statin, disc welchol today after review of side effects declines   2. Consider xray in future if bothersome  3. [place] T and head bid x 10 days  If not better f/u Dr. Kellie Moor last saw 01/2018  4. Cont compression stockings  5.  Flu had 06/07/18 [facility] Tdap never declines  pna vx 1/2 ? Which one in 2017  Declines shingrix  Last colonoscopy several years ago h/o polyps no further testing other than fobt  Out of age window pap LMP 39 years ago  DEXAout of age window Mammogram out of age window  Refer to dermatology Dr. Anthoney Harada h/o skin cancer SCC, BCCsaw recently 2019 LN2 will f/u in 2020 5. Warm soaks and bactroban    Provider: Dr. Olivia Mackie McLean-[facility]oc[facility]

## 2018-07-10 NOTE — Progress Notes (Signed)
Pre visit review using our clinic review tool, if applicable. No additional management support is needed unless otherwise documented below in the visit note. 

## 2018-07-10 NOTE — Patient Instructions (Signed)
Use clindamycin 2x per day to left cheek with heat x 10 days if not better call dermatology   Acne Acne is a skin problem that causes pimples. Acne occurs when the pores in the skin get blocked. The pores may become infected with bacteria, or they may become red, sore, and swollen. Acne is a common skin problem, especially for teenagers. Acne usually goes away over time. What are the causes? Each pore contains an oil gland. Oil glands make an oily substance that is called sebum. Acne happens when these glands get plugged with sebum, dead skin cells, and dirt. Then, the bacteria that are normally found in the oil glands multiply and cause inflammation. Acne is commonly triggered by changes in your hormones. These hormonal changes can cause the oil glands to get bigger and to make more sebum. Factors that can make acne worse include:  Hormone changes during: ? Adolescence. ? Women's menstrual cycles. ? Pregnancy.  Oil-based cosmetics and hair products.  Harshly scrubbing the skin.  Strong soaps.  Stress.  Hormone problems that are due to certain diseases.  Long or oily hair rubbing against the skin.  Certain medicines.  Pressure from headbands, backpacks, or shoulder pads.  Exposure to certain oils and chemicals.  What increases the risk? This condition is more likely to develop in:  Teenagers.  People who have a family history of acne.  What are the signs or symptoms? Acne often occurs on the face, neck, chest, and upper back. Symptoms include:  Small, red bumps (pimples or papules).  Whiteheads.  Blackheads.  Small, pus-filled pimples (pustules).  Big, red pimples or pustules that feel tender.  More severe acne can cause:  An infected area that contains a collection of pus (abscess).  Hard, painful, fluid-filled sacs (cysts).  Scars.  How is this diagnosed? This condition is diagnosed with a medical history and physical exam. Blood tests may also be  done. How is this treated? Treatment for this condition can vary depending on the severity of your acne. Treatment may include:  Creams and lotions that prevent oil glands from clogging.  Creams and lotions that treat or prevent infections and inflammation.  Antibiotic medicines that are applied to the skin or taken as a pill.  Pills that decrease sebum production.  Birth control pills.  Light or laser treatments.  Surgery.  Injections of medicine into the affected areas.  Chemicals that cause peeling of the skin.  Your health care provider will also recommend the best way to take care of your skin. Good skin care is the most important part of treatment. Follow these instructions at home: Skin care Take care of your skin as told by your health care provider. You may be told to do these things:  Wash your skin gently at least two times each day, as well as: ? After you exercise. ? Before you go to bed.  Use mild soap.  Apply a water-based skin moisturizer after you wash your skin.  Use a sunscreen or sunblock with SPF 30 or greater. This is especially important if you are using acne medicines.  Choose cosmetics that will not plug your oil glands (are noncomedogenic).  Medicines  Take over-the-counter and prescription medicines only as told by your health care provider.  If you were prescribed an antibiotic medicine, apply or take it as told by your health care provider. Do not stop taking the antibiotic even if your condition improves. General instructions  Keep your hair clean and off  of your face. If you have oily hair, shampoo your hair regularly or daily.  Avoid leaning your chin or forehead against your hands.  Avoid wearing tight headbands or hats.  Avoid picking or squeezing your pimples. That can make your acne worse and cause scarring.  Keep all follow-up visits as told by your health care provider. This is important.  Shave gently and only when  necessary.  Keep a food journal to figure out if any foods are linked with your acne. Contact a health care provider if:  Your acne is not better after eight weeks.  Your acne gets worse.  You have a large area of skin that is red or tender.  You think that you are having side effects from any acne medicine. This information is not intended to replace advice given to you by your health care provider. Make sure you discuss any questions you have with your health care provider. Document Released: 08/05/2000 Document Revised: 04/08/2016 Document Reviewed: 10/15/2014 Elsevier Interactive Patient Education  [employer].

## 2018-07-31 ENCOUNTER — Non-Acute Institutional Stay (SKILLED_NURSING_FACILITY): Payer: Medicare Other | Admitting: Adult Health

## 2018-07-31 ENCOUNTER — Encounter: Payer: Self-pay | Admitting: Adult Health

## 2018-07-31 DIAGNOSIS — R600 Localized edema: Secondary | ICD-10-CM

## 2018-07-31 DIAGNOSIS — N183 Chronic kidney disease, stage 3 unspecified: Secondary | ICD-10-CM

## 2018-07-31 DIAGNOSIS — R0781 Pleurodynia: Secondary | ICD-10-CM

## 2018-07-31 DIAGNOSIS — I10 Essential (primary) hypertension: Secondary | ICD-10-CM | POA: Diagnosis not present

## 2018-07-31 DIAGNOSIS — J309 Allergic rhinitis, unspecified: Secondary | ICD-10-CM | POA: Diagnosis not present

## 2018-07-31 DIAGNOSIS — K219 Gastro-esophageal reflux disease without esophagitis: Secondary | ICD-10-CM

## 2018-07-31 DIAGNOSIS — F322 Major depressive disorder, single episode, severe without psychotic features: Secondary | ICD-10-CM | POA: Diagnosis not present

## 2018-07-31 NOTE — Progress Notes (Addendum)
Provider:  Ok Edwards, NP [facility] Room Number: [place] of Service:  SNF (31)   PCP: McLean-Scocuzza, Nino Glow, MD Patient Care Team: McLean-Scocuzza, Nino Glow, MD as PCP - General (Internal Medicine) Gerlene Fee, NP as Nurse Practitioner (Geriatric Medicine)  Extended Emergency Contact Information Primary Emergency Contact: northrup,mae Mobile Phone: [phone] Relation: Sister  Code Status: DNR Goals of Care: Advanced Directive information Advanced Directives 07/31/2018  Does Patient Have a Medical Advance Directive? Yes  Type of Paramedic of [place];Living will;Out of facility DNR (pink MOST or yellow form)  Does patient want to make changes to medical advance directive? No - Patient declined  Copy of [place] in Chart? Yes - validated most recent copy scanned in chart (See row information)  Would patient like information on creating a medical advance directive? -  Pre-existing out of facility DNR order (yellow form or pink MOST form) Yellow form placed in chart (order not valid for inpatient use)  Some encounter information is confidential and restricted. Go to Review Flowsheets activity to see all data.      Allergies  Allergen Reactions  . Penicillins     ?rxn   . Statins     ? Rxn cant tolerate crestor and lipitor noted prev records.      Chief Complaint  Patient presents with  . Annual Exam        HPI: Patient is a 84 y.o. female seen today for an annual comprehensive examination. She has been hospitalized in March of this year for severe major depression. She has done well since that time. She denies any uncontrolled knee pain; no changes in appetite; is sleeping well at night. She will continue to be followed for chronic illnesses including: hypertension; allergic rhinitis; gerd.   Past Medical History:  Diagnosis Date  . Arthritis   . Benign essential  hypertension 04/12/2018  . Cancer ([place])    skin mohs left ear ? 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 Colon polyps   . Gastric ulcer   . GERD (gastroesophageal reflux disease)   . Hyperlipidemia   . Hypertension   . Rotator cuff disorder    right  . Shingles    Past Surgical History:  Procedure Laterality Date  . KNEE ARTHROSCOPY     b/l     reports that she has never smoked. She has never used smokeless tobacco. She reports that she drinks alcohol. She reports that she does not use drugs. Social History   Socioeconomic History  . Marital status: Single    Spouse name: Not on file  . Number of children: Not on file  . Years of education: Not on file  . Highest education level: Not on file  Occupational History  . Occupation: retired  Scientific laboratory technician  . Financial resource strain: Not on file  . Food insecurity:    Worry: Not on file    Inability: Not on file  . Transportation needs:    Medical: Not on file    Non-medical: Not on file  Tobacco Use  . Smoking status: Never Smoker  . Smokeless tobacco: Never Used  Substance and Sexual Activity  . Alcohol use: Yes  . Drug use: No  . Sexual activity: Never  Lifestyle  . Physical activity:    Days per week: Not on file    Minutes per session: Not on file  .  Stress: Not on file  Relationships  . Social connections:    Talks on phone: Not on file    Gets together: Not on file    Attends religious service: Not on file    Active member of club or organization: Not on file    Attends meetings of clubs or organizations: Not on file    Relationship status: Not on file  . Intimate partner violence:    Fear of current or ex partner: Not on file    Emotionally abused: Not on file    Physically abused: Not on file    Forced sexual activity: Not on file  Other Topics Concern  . Not on file  Social History Narrative   Retired former Lawyer, assoc. Degree    Moved from [place]    Wears  seat belts, no guns at home    No exercise    Code status DNR   Family History  Problem Relation Age of Onset  . Cancer Mother        ?colon   . Diabetes Mother   . Early death Mother   . Heart disease Mother   . Hypertension Mother   . Hyperlipidemia Mother   . Cancer Father        ? MM  . Diabetes Sister   . Diabetes Brother   . Arthritis Brother     Vitals:   07/31/18 1157  BP: (!) 125/50  Pulse: 70  Resp: 16  Temp: 97.9 F (36.6 C)  SpO2: 96%  Weight: 190 lb 12.8 oz (86.5 kg)  Height: 5\' 1"  (1.549 m)   Body mass index is 36.05 kg/m.  Allergies as of 07/31/2018      Reactions   Penicillins    ?rxn    Statins    ? Rxn cant tolerate crestor and lipitor noted prev records.       Medication List        Accurate as of 07/31/18 12:03 PM. Always use your most recent med list.          acetaminophen 325 MG tablet Commonly known as:  TYLENOL Take 650 mg by mouth every 4 (four) hours as needed. for pain and/or increased temp. May be administered orally (via gastric tube if present), or rectally - if patient unable to take orally.   citalopram 10 MG tablet Commonly known as:  CELEXA Take 1 tablet (10 mg total) by mouth daily.   cyanocobalamin 1000 MCG tablet Take 1,000 mcg by mouth daily.   ipratropium 0.03 % nasal spray Commonly known as:  ATROVENT Place 2 sprays into both nostrils 2 (two) times daily.   Lidocaine 4 % Ptch Apply 1 patch topically daily. Right Ribs/ Flank. Remove after 12 hours   Magnesium Oxide 250 MG Tabs Take 1 tablet by mouth daily.   torsemide 10 MG tablet Commonly known as:  DEMADEX Take 1 tablet (10 mg total) by mouth daily.   traZODone 50 MG tablet Commonly known as:  DESYREL Take 50 mg by mouth at bedtime.   UNABLE TO FIND Diet - Liquids: _X__Regular;   Vitamin D3 50 MCG (2000 UT) Tabs Take 2,000 Units by mouth daily.        SIGNIFICANT DIAGNOSTIC EXAMS  LABS REVIEWED: PREVIOUS:   11-30-17; wbc 7.7; hgb  11.2; hct 33.3; mcv 97.6; plt 300; glucose 108; bun 19; creat 1.18; k+ 3.6; na++ 140; ca 9.4; liver normal albumin 3.3; mag 1.4 chol 243; ldl 153;  trig 145; hdl 61; tsh 2.763 vit B 12: 382; vit D 37.1 01-26-18: glucose 95; bun 15; creat 1.12; k+ 4.3; na++ 138; ca 9.1 03-15-18: hgb a1c 6.7 d-dimer 0.54   NO NEW LABS.   Review of Systems  Constitutional: Negative for malaise/fatigue.  Respiratory: Negative for cough and shortness of breath.   Cardiovascular: Negative for chest pain, palpitations and leg swelling.  Gastrointestinal: Negative for abdominal pain, constipation and heartburn.  Musculoskeletal: Negative for back pain, joint pain and myalgias.  Skin: Negative.   Neurological: Negative for dizziness.  Psychiatric/Behavioral: The patient is not nervous/anxious.       Physical Exam Constitutional:      General: She is not in acute distress.    Appearance: Normal appearance. She is well-developed. She is not diaphoretic.  HENT:     Head: Atraumatic.     Nose: Nose normal.     Mouth/Throat:     Mouth: Mucous membranes are moist.     Pharynx: Oropharynx is clear.  Eyes:     Conjunctiva/sclera: Conjunctivae normal.  Neck:     Thyroid: No thyromegaly.  Cardiovascular:     Rate and Rhythm: Normal rate and regular rhythm.     Heart sounds: Normal heart sounds. No murmur.  Pulmonary:     Effort: Pulmonary effort is normal. No respiratory distress.     Breath sounds: Normal breath sounds.  Abdominal:     General: Bowel sounds are normal. There is no distension.     Palpations: Abdomen is soft.     Tenderness: There is no abdominal tenderness.  Musculoskeletal: Normal range of motion.  Lymphadenopathy:     Cervical: No cervical adenopathy.  Skin:    General: Skin is warm and dry.  Neurological:     General: No focal deficit present.     Mental Status: She is alert and oriented to person, place, and time.  Psychiatric:        Mood and Affect: Mood normal.        ASSESSMENT/ PLAN:  TODAY:   1. Essential benign hypertension: is stable b/p 125/50: is stable will monitor  2. CKD (chronic kidney disease) stage 3 GFR 30-59 mL/min: is stable bun 15; creat 1.12  3. Bilateral lower extremity edema: is stable will continue demadex 10 mg daily   4. 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 Chronic allergic rhinitis: is stable will continue atrovent nasal spray twice daily   6. Right side rib pain: is stable will continue lidoderm 4% patch daily   7. GERD: is stable will monitor  She will need a prevnar vaccine Will need eye exam Will check cbc; cmp; lipids tsh     MD is aware of resident's narcotic use and is in agreement with current plan of care. We will wean dosage as appropriate for resident   Ok Edwards NP [facility]  Contact [phone] Monday through Friday 8am- 5pm  After hours call [phone]

## 2018-08-01 DIAGNOSIS — Z23 Encounter for immunization: Secondary | ICD-10-CM | POA: Diagnosis not present

## 2018-08-02 ENCOUNTER — Other Ambulatory Visit
Admission: RE | Admit: 2018-08-02 | Discharge: 2018-08-02 | Disposition: A | Discharge status: Home / Self Care | Payer: Medicare Other | Source: Skilled Nursing Facility | Attending: Adult Health | Admitting: Adult Health

## 2018-08-02 DIAGNOSIS — E119 Type 2 diabetes mellitus without complications: Secondary | ICD-10-CM | POA: Insufficient documentation

## 2018-08-02 LAB — COMPREHENSIVE METABOLIC PANEL
ALT: 8 U/L (ref 0–44)
ANION GAP: 6 (ref 5–15)
AST: 16 U/L (ref 15–41)
Albumin: 3.4 g/dL — ABNORMAL LOW (ref 3.5–5.0)
Alkaline Phosphatase: 64 U/L (ref 38–126)
BUN: 18 mg/dL (ref 8–23)
CO2: 26 mmol/L (ref 22–32)
Calcium: 9 mg/dL (ref 8.9–10.3)
Chloride: 106 mmol/L (ref 98–111)
Creatinine, Ser: 1.03 mg/dL — ABNORMAL HIGH (ref 0.44–1.00)
GFR calc non Af Amer: 48 mL/min — ABNORMAL LOW (ref >60)
GFR, EST AFRICAN AMERICAN: 56 mL/min — AB (ref >60)
Glucose, Bld: 148 mg/dL — ABNORMAL HIGH (ref 70–99)
POTASSIUM: 4.1 mmol/L (ref 3.5–5.1)
SODIUM: 138 mmol/L (ref 135–145)
Total Bilirubin: 0.8 mg/dL (ref 0.3–1.2)
Total Protein: 6.1 g/dL — ABNORMAL LOW (ref 6.5–8.1)

## 2018-08-02 LAB — CBC WITH DIFFERENTIAL/PLATELET
Abs Immature Granulocytes: 0.05 10*3/uL (ref 0.00–0.07)
Basophils Absolute: 0.1 10*3/uL (ref 0.0–0.1)
Basophils Relative: 1 %
Eosinophils Absolute: 0.2 10*3/uL (ref 0.0–0.5)
Eosinophils Relative: 2 %
HCT: 39.4 % (ref 36.0–46.0)
Hemoglobin: 12.6 g/dL (ref 12.0–15.0)
Immature Granulocytes: 1 %
LYMPHS PCT: 15 %
Lymphs Abs: 1.3 10*3/uL (ref 0.7–4.0)
MCH: 32.1 pg (ref 26.0–34.0)
MCHC: 32 g/dL (ref 30.0–36.0)
MCV: 100.3 fL — ABNORMAL HIGH (ref 80.0–100.0)
Monocytes Absolute: 0.7 10*3/uL (ref 0.1–1.0)
Monocytes Relative: 8 %
NEUTROS ABS: 6.3 10*3/uL (ref 1.7–7.7)
NEUTROS PCT: 73 %
NRBC: 0 % (ref 0.0–0.2)
Platelets: 314 10*3/uL (ref 150–400)
RBC: 3.93 MIL/uL (ref 3.87–5.11)
RDW: 12.3 % (ref 11.5–15.5)
WBC: 8.6 10*3/uL (ref 4.0–10.5)

## 2018-08-02 LAB — LIPID PANEL
CHOLESTEROL: 229 mg/dL — AB (ref 0–200)
HDL: 56 mg/dL (ref >40)
LDL CALC: 126 mg/dL — AB (ref 0–99)
Total CHOL/HDL Ratio: 4.1 RATIO
Triglycerides: 237 mg/dL — ABNORMAL HIGH (ref <150)
VLDL: 47 mg/dL — AB (ref 0–40)

## 2018-08-02 LAB — TSH: TSH: 1.711 u[IU]/mL (ref 0.350–4.500)

## 2018-08-23 ENCOUNTER — Non-Acute Institutional Stay: Payer: Medicare Other | Admitting: Adult Health

## 2018-08-23 ENCOUNTER — Encounter: Payer: Self-pay | Admitting: Adult Health

## 2018-08-23 DIAGNOSIS — J309 Allergic rhinitis, unspecified: Secondary | ICD-10-CM

## 2018-08-23 NOTE — Progress Notes (Signed)
[facility] Room Number: [place] of Service:  ALF (13)   CODE STATUS: DNR  Allergies  Allergen Reactions  . Penicillins     ?rxn   . Statins     ? Rxn cant tolerate crestor and lipitor noted prev records.     Chief Complaint  Patient presents with  . Acute Visit    Sinuses    HPI:  She is complaining of sinus congestion and a loose nonproductive cough. She denies any shortness of breath and no sore throat. There are no reports of fevers present. She does have post nasal drip.   Past Medical History:  Diagnosis Date  . Arthritis   . Benign essential hypertension 04/12/2018  . Cancer ([place])    skin mohs left ear ? 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 Colon polyps   . Gastric ulcer   . GERD (gastroesophageal reflux disease)   . Hyperlipidemia   . Hypertension   . Rotator cuff disorder    right  . Shingles     Past Surgical History:  Procedure Laterality Date  . KNEE ARTHROSCOPY     b/l     Social History   Socioeconomic History  . Marital status: Single    Spouse name: Not on file  . Number of children: Not on file  . Years of education: Not on file  . Highest education level: Not on file  Occupational History  . Occupation: retired  Scientific laboratory technician  . Financial resource strain: Not on file  . Food insecurity:    Worry: Not on file    Inability: Not on file  . Transportation needs:    Medical: Not on file    Non-medical: Not on file  Tobacco Use  . Smoking status: Never Smoker  . Smokeless tobacco: Never Used  Substance and Sexual Activity  . Alcohol use: Yes  . Drug use: No  . Sexual activity: Never  Lifestyle  . Physical activity:    Days per week: Not on file    Minutes per session: Not on file  . Stress: Not on file  Relationships  . Social connections:    Talks on phone: Not on file    Gets together: Not on file    Attends religious service: Not on file    Active member of  club or organization: Not on file    Attends meetings of clubs or organizations: Not on file    Relationship status: Not on file  . Intimate partner violence:    Fear of current or ex partner: Not on file    Emotionally abused: Not on file    Physically abused: Not on file    Forced sexual activity: Not on file  Other Topics Concern  . Not on file  Social History Narrative   Retired former Lawyer, assoc. Degree    Moved from [place]    Wears seat belts, no guns at home    No exercise    Code status DNR   Family History  Problem Relation Age of Onset  . Cancer Mother        ?colon   . Diabetes Mother   . Early death Mother   . Heart disease Mother   . Hypertension Mother   . Hyperlipidemia Mother   . Cancer Father        ? MM  . Diabetes  Sister   . Diabetes Brother   . Arthritis Brother       VITAL SIGNS BP (!) 131/48   Pulse 72   Temp 98.3 F (36.8 C)   Resp 18   Ht 5\' 1"  (1.549 m)   Wt 190 lb 11.2 oz (86.5 kg)   SpO2 93%   BMI 36.03 kg/m   Outpatient Encounter Medications as of 08/23/2018  Medication Sig  . acetaminophen (TYLENOL) 325 MG tablet Take 650 mg by mouth every 4 (four) hours as needed. for pain and/or increased temp. May be administered orally (via gastric tube if present), or rectally - if patient unable to take orally.  . Cholecalciferol (VITAMIN D3) 2000 units TABS Take 2,000 Units by mouth daily.  . citalopram (CELEXA) 10 MG tablet Take 1 tablet (10 mg total) by mouth daily.  . cyanocobalamin 1000 MCG tablet Take 1,000 mcg by mouth daily.  Marland Kitchen ipratropium (ATROVENT) 0.03 % nasal spray Place 2 sprays into both nostrils 2 (two) times daily.  . Lidocaine 4 % PTCH Apply 1 patch topically daily. Right Ribs/ Flank. Remove after 12 hours  . Magnesium Oxide 250 MG TABS Take 1 tablet by mouth daily.  Marland Kitchen torsemide (DEMADEX) 10 MG tablet Take 1 tablet (10 mg total) by mouth daily.  . traZODone (DESYREL) 50 MG tablet Take 50 mg by mouth at  bedtime.  Marland Kitchen UNABLE TO FIND Diet - Liquids: _X__Regular;   No facility-administered encounter medications on file as of 08/23/2018.      SIGNIFICANT DIAGNOSTIC EXAMS  LABS REVIEWED: PREVIOUS:   11-30-17; wbc 7.7; hgb 11.2; hct 33.3; mcv 97.6; plt 300; glucose 108; bun 19; creat 1.18; k+ 3.6; na++ 140; ca 9.4; liver normal albumin 3.3; mag 1.4 chol 243; ldl 153; trig 145; hdl 61; tsh 2.763 vit B 12: 382; vit D 37.1 01-26-18: glucose 95; bun 15; creat 1.12; k+ 4.3; na++ 138; ca 9.1 03-15-18: hgb a1c 6.7 d-dimer 0.54   TODAY;   08-02-18: wbc 8.6; hgb 12.6; hct 39.4; mcv 100.3; plt 314; glucose 148; bun 18; creat 1.03; k+ 4.1; na++ 138; ca 9.0; liver normal albumin 3.4; chol 229; ldl 126; trig 237; hdl 56; tsh 1.711   Review of Systems  Constitutional: Negative for fever and malaise/fatigue.  HENT: Positive for congestion. Negative for sore throat.        Post nasal drip   Respiratory: Positive for cough. Negative for sputum production and shortness of breath.   Cardiovascular: Negative for chest pain, palpitations and leg swelling.  Gastrointestinal: Negative for abdominal pain, constipation and heartburn.  Musculoskeletal: Negative for back pain, joint pain and myalgias.  Skin: Negative.   Neurological: Negative for dizziness.  Psychiatric/Behavioral: The patient is not nervous/anxious.     Physical Exam Constitutional:      General: She is not in acute distress.    Appearance: Normal appearance. She is well-developed. She is not diaphoretic.  HENT:     Nose: Nose normal.     Mouth/Throat:     Mouth: Mucous membranes are moist.     Pharynx: Oropharynx is clear.  Neck:     Musculoskeletal: Neck supple.     Thyroid: No thyromegaly.  Cardiovascular:     Rate and Rhythm: Normal rate and regular rhythm.     Pulses: Normal pulses.     Heart sounds: Normal heart sounds.  Pulmonary:     Effort: Pulmonary effort is normal. No respiratory distress.     Breath sounds: Normal breath  sounds.  Abdominal:     General: Bowel sounds are normal. There is no distension.     Palpations: Abdomen is soft.     Tenderness: There is no abdominal tenderness.  Musculoskeletal: Normal range of motion.     Right lower leg: No edema.     Left lower leg: No edema.  Lymphadenopathy:     Cervical: No cervical adenopathy.  Skin:    General: Skin is warm and dry.  Neurological:     Mental Status: She is alert and oriented to person, place, and time.  Psychiatric:        Mood and Affect: Mood normal.      ASSESSMENT/ PLAN:  TODAY:   1.  Allergic rhinitis: is worse; will begin claritin 10 mg daily through 09-23-18 will monitor      MD is aware of resident's narcotic use and is in agreement with current plan of care. We will attempt to wean resident as apropriate   Ok Edwards NP [facility]  Contact [phone] Monday through Friday 8am- 5pm  After hours call [phone]

## 2018-08-27 ENCOUNTER — Non-Acute Institutional Stay: Payer: Medicare Other | Admitting: Adult Health

## 2018-08-27 ENCOUNTER — Encounter: Payer: Self-pay | Admitting: Adult Health

## 2018-08-27 DIAGNOSIS — J309 Allergic rhinitis, unspecified: Secondary | ICD-10-CM | POA: Diagnosis not present

## 2018-08-27 NOTE — Progress Notes (Signed)
[facility]:   [facility] Room Number: [place] of Service:  ALF (13)   CODE STATUS: DNR  Allergies  Allergen Reactions  . Penicillins     ?rxn   . Statins     ? Rxn cant tolerate crestor and lipitor noted prev records.     Chief Complaint  Patient presents with  . Acute Visit    Worsening Cough    HPI:  Staff reports that she continues to have a cough. She tells me that she is having a cough; but feels better than she did when last seen. She is tolerating claritin without difficulty. There are no reports of fevers present. She denies any sputum production no chest pain or shortness of breath. No post nasal drip    Past Medical History:  Diagnosis Date  . Arthritis   . Benign essential hypertension 04/12/2018  . Cancer ([place])    skin mohs left ear ? 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 Colon polyps   . Gastric ulcer   . GERD (gastroesophageal reflux disease)   . Hyperlipidemia   . Hypertension   . Rotator cuff disorder    right  . Shingles     Past Surgical History:  Procedure Laterality Date  . KNEE ARTHROSCOPY     b/l     Social History   Socioeconomic History  . Marital status: Single    Spouse name: Not on file  . Number of children: Not on file  . Years of education: Not on file  . Highest education level: Not on file  Occupational History  . Occupation: retired  Scientific laboratory technician  . Financial resource strain: Not on file  . Food insecurity:    Worry: Not on file    Inability: Not on file  . Transportation needs:    Medical: Not on file    Non-medical: Not on file  Tobacco Use  . Smoking status: Never Smoker  . Smokeless tobacco: Never Used  Substance and Sexual Activity  . Alcohol use: Yes  . Drug use: No  . Sexual activity: Never  Lifestyle  . Physical activity:    Days per week: Not on file    Minutes per session: Not on file  . Stress: Not on file  Relationships  . Social connections:   Talks on phone: Not on file    Gets together: Not on file    Attends religious service: Not on file    Active member of club or organization: Not on file    Attends meetings of clubs or organizations: Not on file    Relationship status: Not on file  . Intimate partner violence:    Fear of current or ex partner: Not on file    Emotionally abused: Not on file    Physically abused: Not on file    Forced sexual activity: Not on file  Other Topics Concern  . Not on file  Social History Narrative   Retired former Lawyer, assoc. Degree    Moved from [place]    Wears seat belts, no guns at home    No exercise    Code status DNR   Family History  Problem Relation Age of Onset  . Cancer Mother        ?colon   . Diabetes Mother   . Early death Mother   . Heart disease Mother   . Hypertension Mother   .  Hyperlipidemia Mother   . Cancer Father        ? MM  . Diabetes Sister   . Diabetes Brother   . Arthritis Brother       VITAL SIGNS BP (!) 131/48   Pulse 72   Temp 98.1 F (36.7 C)   Resp 18   Ht 5\' 1"  (1.549 m)   Wt 190 lb 11.2 oz (86.5 kg)   SpO2 93%   BMI 36.03 kg/m   Outpatient Encounter Medications as of 08/27/2018  Medication Sig  . acetaminophen (TYLENOL) 325 MG tablet Take 650 mg by mouth every 4 (four) hours as needed. for pain and/or increased temp. May be administered orally (via gastric tube if present), or rectally - if patient unable to take orally.  . Cholecalciferol (VITAMIN D3) 2000 units TABS Take 2,000 Units by mouth daily.  . citalopram (CELEXA) 10 MG tablet Take 1 tablet (10 mg total) by mouth daily.  . cyanocobalamin 1000 MCG tablet Take 1,000 mcg by mouth daily.  Marland Kitchen ipratropium (ATROVENT) 0.03 % nasal spray Place 2 sprays into both nostrils 2 (two) times daily.  . Lidocaine 4 % PTCH Apply 1 patch topically daily. Right Ribs/ Flank. Remove after 12 hours  . Magnesium Oxide 250 MG TABS Take 1 tablet by mouth daily.  Marland Kitchen torsemide  (DEMADEX) 10 MG tablet Take 1 tablet (10 mg total) by mouth daily.  . traZODone (DESYREL) 50 MG tablet Take 50 mg by mouth at bedtime.  Marland Kitchen UNABLE TO FIND Diet - Liquids: _X__Regular;   No facility-administered encounter medications on file as of 08/27/2018.      SIGNIFICANT DIAGNOSTIC EXAMS   LABS REVIEWED: PREVIOUS:   11-30-17; wbc 7.7; hgb 11.2; hct 33.3; mcv 97.6; plt 300; glucose 108; bun 19; creat 1.18; k+ 3.6; na++ 140; ca 9.4; liver normal albumin 3.3; mag 1.4 chol 243; ldl 153; trig 145; hdl 61; tsh 2.763 vit B 12: 382; vit D 37.1 01-26-18: glucose 95; bun 15; creat 1.12; k+ 4.3; na++ 138; ca 9.1 03-15-18: hgb a1c 6.7 d-dimer 0.54  08-02-18: wbc 8.6; hgb 12.6; hct 39.4; mcv 100.3; plt 314; glucose 148; bun 18; creat 1.03; k+ 4.1; na++ 138; ca 9.0; liver normal albumin 3.4; chol 229; ldl 126; trig 237; hdl 56; tsh 1.711  NO NEW LABS.    Review of Systems  Constitutional: Negative for malaise/fatigue.  HENT: Negative for congestion and sore throat.   Respiratory: Positive for cough. Negative for sputum production and shortness of breath.   Cardiovascular: Negative for chest pain, palpitations and leg swelling.  Gastrointestinal: Negative for abdominal pain, constipation and heartburn.  Musculoskeletal: Negative for back pain, joint pain and myalgias.  Skin: Negative.   Neurological: Negative for dizziness.  Psychiatric/Behavioral: The patient is not nervous/anxious.     Physical Exam Constitutional:      General: She is not in acute distress.    Appearance: Normal appearance. She is well-developed. She is not diaphoretic.  HENT:     Nose: No rhinorrhea.     Mouth/Throat:     Mouth: Mucous membranes are moist.     Pharynx: Oropharynx is clear.  Neck:     Musculoskeletal: Neck supple.     Thyroid: No thyromegaly.  Cardiovascular:     Rate and Rhythm: Normal rate and regular rhythm.     Pulses: Normal pulses.     Heart sounds: Normal heart sounds.  Pulmonary:     Effort:  Pulmonary effort is normal. No respiratory distress.  Breath sounds: Normal breath sounds.  Abdominal:     General: Bowel sounds are normal. There is no distension.     Palpations: Abdomen is soft.     Tenderness: There is no abdominal tenderness.  Musculoskeletal: Normal range of motion.  Lymphadenopathy:     Cervical: No cervical adenopathy.  Skin:    General: Skin is warm and dry.  Neurological:     Mental Status: She is alert and oriented to person, place, and time.  Psychiatric:        Mood and Affect: Mood normal.      ASSESSMENT/ PLAN:  TODAY:   1. Chronic allergic rhinitis: is without change: will add flonase nightly for one month and robitussin 15 cc every 6 hours as needed through 09-27-18.     MD is aware of resident's narcotic use and is in agreement with current plan of care. We will attempt to wean resident as apropriate   Ok Edwards NP [facility]  Contact [phone] Monday through Friday 8am- 5pm  After hours call [phone]

## 2018-09-03 ENCOUNTER — Encounter
Admission: RE | Admit: 2018-09-03 | Discharge: 2018-09-03 | Disposition: A | Discharge status: Home / Self Care | Payer: Medicare Other | Source: Ambulatory Visit | Attending: Internal Medicine | Admitting: Internal Medicine

## 2018-09-22 ENCOUNTER — Encounter
Admission: RE | Admit: 2018-09-22 | Discharge: 2018-09-22 | Disposition: A | Discharge status: Home / Self Care | Payer: Medicare Other | Source: Ambulatory Visit | Attending: Internal Medicine | Admitting: Internal Medicine

## 2018-10-03 ENCOUNTER — Encounter: Payer: Self-pay | Admitting: Adult Health

## 2018-10-03 ENCOUNTER — Non-Acute Institutional Stay: Payer: Medicare Other | Admitting: Adult Health

## 2018-10-03 DIAGNOSIS — F322 Major depressive disorder, single episode, severe without psychotic features: Secondary | ICD-10-CM | POA: Diagnosis not present

## 2018-10-03 DIAGNOSIS — N183 Chronic kidney disease, stage 3 unspecified: Secondary | ICD-10-CM

## 2018-10-03 DIAGNOSIS — I10 Essential (primary) hypertension: Secondary | ICD-10-CM

## 2018-10-03 DIAGNOSIS — R600 Localized edema: Secondary | ICD-10-CM

## 2018-10-03 NOTE — Progress Notes (Signed)
[facility]:   [facility] Room Number: [place] of Service:  ALF (13)   CODE STATUS: DNR  Allergies  Allergen Reactions  . Penicillins     ?rxn   . Statins     ? Rxn cant tolerate crestor and lipitor noted prev records.     Chief Complaint  Patient presents with  . Medical Management of Chronic Issues    Benign essentia; hypertension; CKD (chronic kidney disease) stage 3 GFR 30-26ml/min; severe major depression single episode without psychotic features; bilateral lower extremity edema.     HPI:  She is a 85 year old long term resident of assisted living being seen for the management of her chronic illnesses: hypertension; ckd stage 3; depression; edema. She denies any swelling in her legs; no headaches of blurred vision. No changes in appetite; no depressive thoughts.   Past Medical History:  Diagnosis Date  . Arthritis   . Benign essential hypertension 04/12/2018  . Cancer ([place])    skin mohs left ear ? 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 Colon polyps   . Gastric ulcer   . GERD (gastroesophageal reflux disease)   . Hyperlipidemia   . Hypertension   . Rotator cuff disorder    right  . Shingles     Past Surgical History:  Procedure Laterality Date  . KNEE ARTHROSCOPY     b/l     Social History   Socioeconomic History  . Marital status: Single    Spouse name: Not on file  . Number of children: Not on file  . Years of education: Not on file  . Highest education level: Not on file  Occupational History  . Occupation: retired  Scientific laboratory technician  . Financial resource strain: Not on file  . Food insecurity:    Worry: Not on file    Inability: Not on file  . Transportation needs:    Medical: Not on file    Non-medical: Not on file  Tobacco Use  . Smoking status: Never Smoker  . Smokeless tobacco: Never Used  Substance and Sexual Activity  . Alcohol use: Yes  . Drug use: No  . Sexual activity: Never  Lifestyle    . Physical activity:    Days per week: Not on file    Minutes per session: Not on file  . Stress: Not on file  Relationships  . Social connections:    Talks on phone: Not on file    Gets together: Not on file    Attends religious service: Not on file    Active member of club or organization: Not on file    Attends meetings of clubs or organizations: Not on file    Relationship status: Not on file  . Intimate partner violence:    Fear of current or ex partner: Not on file    Emotionally abused: Not on file    Physically abused: Not on file    Forced sexual activity: Not on file  Other Topics Concern  . Not on file  Social History Narrative   Retired former Lawyer, assoc. Degree    Moved from [place]    Wears seat belts, no guns at home    No exercise    Code status DNR   Family History  Problem Relation Age of Onset  . Cancer Mother        ?colon   . Diabetes Mother   .  Early death Mother   . Heart disease Mother   . Hypertension Mother   . Hyperlipidemia Mother   . Cancer Father        ? MM  . Diabetes Sister   . Diabetes Brother   . Arthritis Brother       VITAL SIGNS BP 127/71   Pulse 71   Temp 97.8 F (36.6 C)   Resp 18   Ht 5\' 1"  (1.549 m)   Wt 192 lb 14.4 oz (87.5 kg)   SpO2 95%   BMI 36.45 kg/m   Outpatient Encounter Medications as of 10/03/2018  Medication Sig  . acetaminophen (TYLENOL) 325 MG tablet Take 650 mg by mouth every 4 (four) hours as needed. for pain and/or increased temp. May be administered orally (via gastric tube if present), or rectally - if patient unable to take orally.  . Cholecalciferol (VITAMIN D3) 2000 units TABS Take 2,000 Units by mouth daily.  . citalopram (CELEXA) 10 MG tablet Take 1 tablet (10 mg total) by mouth daily.  . cyanocobalamin 1000 MCG tablet Take 1,000 mcg by mouth daily.  Marland Kitchen ipratropium (ATROVENT) 0.03 % nasal spray Place 2 sprays into both nostrils 2 (two) times daily.  . Lidocaine 4 % PTCH  Apply 1 patch topically daily. Right Ribs/ Flank. Remove after 12 hours  . Magnesium Oxide 250 MG TABS Take 1 tablet by mouth daily.  Marland Kitchen torsemide (DEMADEX) 10 MG tablet Take 1 tablet (10 mg total) by mouth daily.  . traZODone (DESYREL) 50 MG tablet Take 50 mg by mouth at bedtime.  Marland Kitchen UNABLE TO FIND Diet - Liquids: _X__Regular;   No facility-administered encounter medications on file as of 10/03/2018.      SIGNIFICANT DIAGNOSTIC EXAMS  LABS REVIEWED: PREVIOUS:   11-30-17; wbc 7.7; hgb 11.2; hct 33.3; mcv 97.6; plt 300; glucose 108; bun 19; creat 1.18; k+ 3.6; na++ 140; ca 9.4; liver normal albumin 3.3; mag 1.4 chol 243; ldl 153; trig 145; hdl 61; tsh 2.763 vit B 12: 382; vit D 37.1 01-26-18: glucose 95; bun 15; creat 1.12; k+ 4.3; na++ 138; ca 9.1 03-15-18: hgb a1c 6.7 d-dimer 0.54  08-02-18: wbc 8.6; hgb 12.6; hct 39.4; mcv 100.3; plt 314; glucose 148; bun 18; creat 1.03; k+ 4.1; na++ 138; ca 9.0; liver normal albumin 3.4; chol 229; ldl 126; trig 237; hdl 56; tsh 1.711  NO NEW LABS.   Review of Systems  Constitutional: Negative for malaise/fatigue.  Respiratory: Negative for cough and shortness of breath.   Cardiovascular: Negative for chest pain, palpitations and leg swelling.  Gastrointestinal: Negative for abdominal pain, constipation and heartburn.  Musculoskeletal: Negative for back pain, joint pain and myalgias.  Skin: Negative.   Neurological: Negative for dizziness.  Psychiatric/Behavioral: The patient is not nervous/anxious.     Physical Exam Constitutional:      General: She is not in acute distress.    Appearance: She is well-developed. She is not diaphoretic.  Neck:     Musculoskeletal: Neck supple.     Thyroid: No thyromegaly.  Cardiovascular:     Rate and Rhythm: Normal rate and regular rhythm.     Pulses: Normal pulses.     Heart sounds: Normal heart sounds.  Pulmonary:     Effort: Pulmonary effort is normal. No respiratory distress.     Breath sounds: Normal  breath sounds.  Abdominal:     General: Bowel sounds are normal. There is no distension.     Palpations: Abdomen is soft.  Tenderness: There is no abdominal tenderness.  Musculoskeletal:     Right lower leg: No edema.     Left lower leg: No edema.     Comments: Is able to move all extremities   Lymphadenopathy:     Cervical: No cervical adenopathy.  Skin:    General: Skin is warm and dry.  Neurological:     Mental Status: She is alert and oriented to person, place, and time.     ASSESSMENT/ PLAN:  TODAY:   1. Essential benign hypertension: is stable b/p 127/71: is stable will monitor  2. CKD (chronic kidney disease) stage 3 GFR 30-59 mL/min: is stable bun 15; creat 1.12  3. Bilateral lower extremity edema: is stable will continue demadex 10 mg daily   4. 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 Chronic allergic rhinitis: is stable will continue atrovent nasal spray twice daily   6. Right side rib pain: is stable will continue lidoderm 4% patch daily   7. GERD: is stable will monitor  Will give pneumovax   MD is aware of resident's narcotic use and is in agreement with current plan of care. We will attempt to wean resident as apropriate   Ok Edwards NP [facility] Adult Medicine  Contact [phone] Monday through Friday 8am- 5pm  After hours call [phone]

## 2018-10-24 ENCOUNTER — Encounter
Admission: RE | Admit: 2018-10-24 | Discharge: 2018-10-24 | Disposition: A | Discharge status: Home / Self Care | Payer: Medicare Other | Source: Ambulatory Visit | Attending: Internal Medicine | Admitting: Internal Medicine

## 2018-11-07 DIAGNOSIS — Z593 Problems related to living in residential institution: Secondary | ICD-10-CM | POA: Diagnosis not present

## 2018-11-07 DIAGNOSIS — E119 Type 2 diabetes mellitus without complications: Secondary | ICD-10-CM | POA: Diagnosis not present

## 2018-11-07 DIAGNOSIS — I10 Essential (primary) hypertension: Secondary | ICD-10-CM | POA: Diagnosis not present

## 2018-11-07 DIAGNOSIS — E7800 Pure hypercholesterolemia, unspecified: Secondary | ICD-10-CM | POA: Diagnosis not present

## 2018-11-07 DIAGNOSIS — F325 Major depressive disorder, single episode, in full remission: Secondary | ICD-10-CM | POA: Diagnosis not present

## 2018-11-21 ENCOUNTER — Encounter
Admission: RE | Admit: 2018-11-21 | Discharge: 2018-11-21 | Disposition: A | Discharge status: Home / Self Care | Payer: Medicare Other | Source: Ambulatory Visit | Attending: Internal Medicine | Admitting: Internal Medicine

## 2018-12-04 ENCOUNTER — Other Ambulatory Visit: Payer: Self-pay

## 2018-12-04 DIAGNOSIS — F325 Major depressive disorder, single episode, in full remission: Secondary | ICD-10-CM | POA: Diagnosis not present

## 2018-12-04 DIAGNOSIS — E119 Type 2 diabetes mellitus without complications: Secondary | ICD-10-CM | POA: Diagnosis not present

## 2018-12-04 DIAGNOSIS — Z593 Problems related to living in residential institution: Secondary | ICD-10-CM | POA: Diagnosis not present

## 2018-12-04 DIAGNOSIS — E7800 Pure hypercholesterolemia, unspecified: Secondary | ICD-10-CM | POA: Diagnosis not present

## 2018-12-11 ENCOUNTER — Ambulatory Visit: Payer: Self-pay | Admitting: Internal Medicine

## 2018-12-13 ENCOUNTER — Encounter: Payer: Self-pay | Admitting: Adult Health

## 2018-12-13 ENCOUNTER — Non-Acute Institutional Stay: Payer: Medicare Other | Admitting: Adult Health

## 2018-12-13 DIAGNOSIS — E538 Deficiency of other specified B group vitamins: Secondary | ICD-10-CM | POA: Diagnosis not present

## 2018-12-13 DIAGNOSIS — R600 Localized edema: Secondary | ICD-10-CM | POA: Diagnosis not present

## 2018-12-13 DIAGNOSIS — G4709 Other insomnia: Secondary | ICD-10-CM | POA: Diagnosis not present

## 2018-12-13 DIAGNOSIS — F322 Major depressive disorder, single episode, severe without psychotic features: Secondary | ICD-10-CM

## 2018-12-13 DIAGNOSIS — B370 Candidal stomatitis: Secondary | ICD-10-CM

## 2018-12-13 NOTE — Progress Notes (Signed)
[facility]  [facility] Room Number: [id number] Place of Service:  ALF [phone]) Provider:  Durenda Age, NP  Patient Care Team: McLean-Scocuzza, Nino Glow, MD as PCP - General (Internal Medicine) Gerlene Fee, NP as Nurse Practitioner (Geriatric Medicine)  Extended Emergency Contact Information Primary Emergency Contact: northrup,mae Mobile Phone: [phone] Relation: Sister  Code Status:  DNR  Goals of care: Advanced Directive information Advanced Directives 12/13/2018  Does Patient Have a Medical Advance Directive? Yes  Type of Paramedic of [place];Living will;Out of facility DNR (pink MOST or yellow form)  Does patient want to make changes to medical advance directive? No - Patient declined  Copy of [place] in Chart? Yes - validated most recent copy scanned in chart (See row information)  Would patient like information on creating a medical advance directive? -  Pre-existing out of facility DNR order (yellow form or pink MOST form) Yellow form placed in chart (order not valid for inpatient use)  Some encounter information is confidential and restricted. Go to Review Flowsheets activity to see all data.     Chief Complaint  Patient presents with  . Medical Management of Chronic Issues    Routine Visit    HPI:  Pt is a 85 y.o. female seen today for medical management of chronic diseases.  She has PMH of hypertension, chronic kidney disease stage III, depression and edema.  She was seen in her room today.  She reported to have no problem sleeping at night and even sleeps during daytime.  She takes 6 oz brandy at 6 PM. Noted to have brownish coating on her tongue. She said that she brushes her tongue everyday but the brownish coating doesn't get removed.  She denies depression.   Past Medical History:  Diagnosis Date  . Arthritis   . Benign essential hypertension 04/12/2018  . Cancer ([place])    skin  mohs left ear ? 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 Colon polyps   . Gastric ulcer   . GERD (gastroesophageal reflux disease)   . Hyperlipidemia   . Hypertension   . Rotator cuff disorder    right  . Shingles    Past Surgical History:  Procedure Laterality Date  . KNEE ARTHROSCOPY     b/l     Allergies  Allergen Reactions  . Penicillins     ?rxn   . Statins     ? Rxn cant tolerate crestor and lipitor noted prev records.     Outpatient Encounter Medications as of 12/13/2018  Medication Sig  . acetaminophen (TYLENOL) 325 MG tablet Take 650 mg by mouth every 4 (four) hours as needed. for pain and/or increased temp. May be administered orally (via gastric tube if present), or rectally - if patient unable to take orally.  . Cholecalciferol (VITAMIN D3) 2000 units TABS Take 2,000 Units by mouth daily.  . citalopram (CELEXA) 10 MG tablet Take 1 tablet (10 mg total) by mouth daily.  . cyanocobalamin 1000 MCG tablet Take 1,000 mcg by mouth daily.  Marland Kitchen ipratropium (ATROVENT) 0.03 % nasal spray Place 2 sprays into both nostrils 2 (two) times daily.  . Magnesium Oxide 250 MG TABS Take 1 tablet by mouth daily.  Marland Kitchen torsemide (DEMADEX) 10 MG tablet Take 1 tablet (10 mg total) by mouth daily.  . traZODone (DESYREL) 50 MG tablet Take 50 mg by mouth at bedtime.  Marland Kitchen UNABLE TO FIND  Diet - Liquids: _X__Regular;  . [DISCONTINUED] Lidocaine 4 % PTCH Apply 1 patch topically daily. Right Ribs/ Flank. Remove after 12 hours   No facility-administered encounter medications on file as of 12/13/2018.     Review of Systems  GENERAL: No change in appetite, no fatigue, no weight changes, no fever, chills or weakness MOUTH and THROAT: Denies oral discomfort, gingival pain or bleeding, pain from teeth or hoarseness   RESPIRATORY: no cough, SOB, DOE, wheezing, hemoptysis CARDIAC: No chest pain, or palpitations GI: No abdominal pain, diarrhea, constipation, heart burn, nausea or vomiting GU:  Denies dysuria, frequency, hematuria, or discharge NEUROLOGICAL: Denies dizziness, syncope, numbness, or headache PSYCHIATRIC: Denies feelings of depression or anxiety. No report of hallucinations, insomnia, paranoia, or agitation    Immunization History  Administered Date(s) Administered  . Influenza-Unspecified 04/27/2017, 06/07/2018   Pertinent  Health Maintenance Due  Topic Date Due  . PNA vac Low Risk Adult (1 of 2 - PCV13) 01/01/1995  . URINE MICROALBUMIN  09/27/2018  . INFLUENZA VACCINE  03/23/2019   Fall Risk  07/10/2018 01/25/2018 09/27/2017  Falls in the past year? 0 No No     Vitals:   12/13/18 1116  BP: (!) 128/57  Pulse: 66  Resp: 18  Temp: 97.9 F (36.6 C)  TempSrc: Oral  SpO2: 97%  Weight: 194 lb 4.8 oz (88.1 kg)  Height: 5\' 1"  (1.549 m)   Body mass index is 36.71 kg/m.  Physical Exam  GENERAL APPEARANCE: Well nourished. In no acute distress. Obese SKIN:  Skin is warm and dry.  MOUTH and THROAT: Lips are without lesions. Oral mucosa is moist and without lesions. Tongue has brownish coating RESPIRATORY: Breathing is even & unlabored, BS CTAB CARDIAC: RRR, no murmur,no extra heart sounds, BLE 1+ edema GI: Abdomen soft, normal BS, no masses, no tenderness EXTREMITIES:  Able to move X 4 extremities NEUROLOGICAL: There is no tremor. Speech is clear. Alert and oriented X 3. PSYCHIATRIC:  Affect and behavior are appropriate  Labs reviewed: Recent Labs    01/26/18 1215 08/02/18 1400  NA 138 138  K 4.3 4.1  CL 99* 106  CO2 30 26  GLUCOSE 95 148*  BUN 15 18  CREATININE 1.12* 1.03*  CALCIUM 9.1 9.0   Recent Labs    08/02/18 1400  AST 16  ALT 8  ALKPHOS 64  BILITOT 0.8  PROT 6.1*  ALBUMIN 3.4*   Recent Labs    08/02/18 1400  WBC 8.6  NEUTROABS 6.3  HGB 12.6  HCT 39.4  MCV 100.3*  PLT 314   Lab Results  Component Value Date   TSH 1.711 08/02/2018   Lab Results  Component Value Date   HGBA1C 6.4 (A) 07/10/2018   Lab Results   Component Value Date   CHOL 229 (H) 08/02/2018   HDL 56 08/02/2018   LDLCALC 126 (H) 08/02/2018   TRIG 237 (H) 08/02/2018   CHOLHDL 4.1 08/02/2018     Assessment/Plan  1. Oral candida - will start nystatin 100,000 unit/mL give 5 mL swish and spit 4 times daily x2 weeks, oral care daily  2. Bilateral lower extremity edema -Stable, continue TED hose stockings daily, torsemide 10 mg 1 tab daily  3. Hypomagnesemia -Continue magnesium oxide 250 mg 1 tab daily  4. Vitamin B12 deficiency Lab Results  Component Value Date   VITAMINB12 382 11/30/2017  -Continue vitamin B12 1000 mcg 1 tab daily  5. Severe major depression, single episode, without psychotic features ([place]) -Mood is  stable, continue citalopram 10 mg 1 tablet daily  6. Other insomnia -Verbalized sleeping even in the morning, will change trazodone from 50 mg at bedtime to 25 mg at bedtime as needed   Family/ staff Communication: Discussed plan of care with resident.  Labs/tests ordered: None  Goals of care: Long-term ALF  Durenda Age, NP [facility] and Adult Medicine [phone] (Monday-Friday 8:00 a.m. - 5:00 p.m.) [phone] (after hours)

## 2018-12-21 ENCOUNTER — Encounter
Admission: RE | Admit: 2018-12-21 | Discharge: 2018-12-21 | Disposition: A | Discharge status: Home / Self Care | Payer: Medicare Other | Source: Ambulatory Visit | Attending: Internal Medicine | Admitting: Internal Medicine

## 2019-01-07 DIAGNOSIS — D485 Neoplasm of uncertain behavior of skin: Secondary | ICD-10-CM | POA: Diagnosis not present

## 2019-01-10 DIAGNOSIS — X32XXXA Exposure to sunlight, initial encounter: Secondary | ICD-10-CM | POA: Diagnosis not present

## 2019-01-10 DIAGNOSIS — C44329 Squamous cell carcinoma of skin of other parts of face: Secondary | ICD-10-CM | POA: Diagnosis not present

## 2019-01-10 DIAGNOSIS — D485 Neoplasm of uncertain behavior of skin: Secondary | ICD-10-CM | POA: Diagnosis not present

## 2019-01-10 DIAGNOSIS — L570 Actinic keratosis: Secondary | ICD-10-CM | POA: Diagnosis not present

## 2019-01-21 ENCOUNTER — Encounter
Admission: RE | Admit: 2019-01-21 | Discharge: 2019-01-21 | Disposition: A | Discharge status: Home / Self Care | Payer: Medicare Other | Source: Ambulatory Visit | Attending: Internal Medicine | Admitting: Internal Medicine

## 2019-01-22 DIAGNOSIS — C44329 Squamous cell carcinoma of skin of other parts of face: Secondary | ICD-10-CM | POA: Diagnosis not present

## 2019-01-29 DIAGNOSIS — Z20828 Contact with and (suspected) exposure to other viral communicable diseases: Secondary | ICD-10-CM | POA: Diagnosis not present

## 2019-02-04 ENCOUNTER — Non-Acute Institutional Stay: Payer: Medicare Other | Admitting: Adult Health

## 2019-02-04 ENCOUNTER — Encounter: Payer: Self-pay | Admitting: Adult Health

## 2019-02-04 DIAGNOSIS — N183 Chronic kidney disease, stage 3 (moderate): Secondary | ICD-10-CM | POA: Diagnosis not present

## 2019-02-04 DIAGNOSIS — E559 Vitamin D deficiency, unspecified: Secondary | ICD-10-CM | POA: Diagnosis not present

## 2019-02-04 DIAGNOSIS — R600 Localized edema: Secondary | ICD-10-CM | POA: Diagnosis not present

## 2019-02-04 DIAGNOSIS — F322 Major depressive disorder, single episode, severe without psychotic features: Secondary | ICD-10-CM | POA: Diagnosis not present

## 2019-02-04 DIAGNOSIS — E1122 Type 2 diabetes mellitus with diabetic chronic kidney disease: Secondary | ICD-10-CM

## 2019-02-04 NOTE — Progress Notes (Signed)
Location:  [facility] Room Number: [place]:  ALF [id number]) Provider:  Durenda Age, DNP, FNP-BC  Patient Care Team: McLean-Scocuzza, Nino Glow, MD as PCP - General (Internal Medicine) Gerlene Fee, NP as Nurse Practitioner (Geriatric Medicine)  Extended Emergency Contact Information Primary Emergency Contact: northrup,mae Mobile Phone: [phone] Relation: Sister  Code Status:  DNR  Goals of care: Advanced Directive information Advanced Directives 02/04/2019  Does Patient Have a Medical Advance Directive? Yes  Type of Advance Directive Out of facility DNR (pink MOST or yellow form)  Does patient want to make changes to medical advance directive? No - Patient declined  Copy of [place] in Chart? No - copy requested  Would patient like information on creating a medical advance directive? -  Pre-existing out of facility DNR order (yellow form or pink MOST form) Yellow form placed in chart (order not valid for inpatient use)  Some encounter information is confidential and restricted. Go to Review Flowsheets activity to see all data.     Chief Complaint  Patient presents with  . Medical Management of Chronic Issues    Routine TVAB ALF visit     HPI:  Pt is an 85 y.o. female seen today for medical management of chronic diseases.  She has PMH of hypertension, chronic kidney disease stage III, depression and edema. She was seen in her room today. She was noted to be happy and hopeful regarding the quarantine situation. She said that she has no children but has a sister in [place]. She is sad that nobody can visit her but understand the reason for the restriction. She has BLE 2+ edema and continues to take Torsemide 10 mg 1 tab daily. She denies any pain.  CBGs 174, 163, 170, 173, 172.  Currently not on any medications or diabetes.   Past Medical History:  Diagnosis Date  . Arthritis   . Benign essential  hypertension 04/12/2018  . Cancer ([place])    skin mohs left ear ? 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 Colon polyps   . Gastric ulcer   . GERD (gastroesophageal reflux disease)   . Hyperlipidemia   . Hypertension   . Rotator cuff disorder    right  . Shingles    Past Surgical History:  Procedure Laterality Date  . KNEE ARTHROSCOPY     b/l     Allergies  Allergen Reactions  . Penicillins     ?rxn   . Statins     ? Rxn cant tolerate crestor and lipitor noted prev records.     Outpatient Encounter Medications as of 02/04/2019  Medication Sig  . acetaminophen (TYLENOL) 325 MG tablet Take 650 mg by mouth every 4 (four) hours as needed. for pain and/or increased temp. May be administered orally (via gastric tube if present), or rectally - if patient unable to take orally.  . Cholecalciferol (VITAMIN D3) 2000 units TABS Take 2,000 Units by mouth daily.  . citalopram (CELEXA) 10 MG tablet Take 1 tablet (10 mg total) by mouth daily.  . cyanocobalamin 1000 MCG tablet Take 1,000 mcg by mouth daily.  Marland Kitchen ipratropium (ATROVENT) 0.03 % nasal spray Place 2 sprays into both nostrils 2 (two) times daily.  . Magnesium Oxide 250 MG TABS Take 1 tablet by mouth daily.  Marland Kitchen torsemide (DEMADEX) 10 MG tablet Take 1 tablet (10 mg total) by mouth daily.  . traZODone (DESYREL) 50  MG tablet Take 50 mg by mouth at bedtime as needed.   Marland Kitchen UNABLE TO FIND Diet - Liquids: _X__Regular;   No facility-administered encounter medications on file as of 02/04/2019.     Review of Systems  GENERAL: No change in appetite, no fatigue, no weight changes, no fever, chills or weakness MOUTH and THROAT: Denies oral discomfort, gingival pain or bleeding RESPIRATORY: no cough, SOB, DOE, wheezing, hemoptysis CARDIAC: No chest pain, edema or palpitations GI: No abdominal pain, diarrhea, constipation, heart burn, nausea or vomiting GU: Denies dysuria, frequency, hematuria, incontinence, or discharge  NEUROLOGICAL: Denies dizziness, syncope, numbness, or headache PSYCHIATRIC: Denies feelings of depression or anxiety. No report of hallucinations, insomnia, paranoia, or agitation    Immunization History  Administered Date(s) Administered  . Influenza-Unspecified 04/27/2017, 06/07/2018   Pertinent  Health Maintenance Due  Topic Date Due  . FOOT EXAM  01[birth year]01/1938  . OPHTHALMOLOGY EXAM  01/01/1940  . URINE MICROALBUMIN  09/27/2018  . HEMOGLOBIN A1C  01/08/2019  . PNA vac Low Risk Adult (1 of 2 - PCV13) 02/04/2020 (Originally 01/01/1995)  . INFLUENZA VACCINE  03/23/2019   Fall Risk  07/10/2018 01/25/2018 09/27/2017  Falls in the past year? 0 No No     Vitals:   02/04/19 1339  BP: 128/63  Pulse: 77  Resp: 18  Temp: (!) 96.1 F (35.6 C)  TempSrc: Oral  SpO2: 100%  Weight: 197 lb 3.2 oz (89.4 kg)  Height: 5\' 1"  (1.549 m)   Body mass index is 37.26 kg/m.  Physical Exam  GENERAL APPEARANCE: Well nourished. In no acute distress. Obese SKIN:  Skin is warm and dry.  MOUTH and THROAT: Lips are without lesions. Oral mucosa is moist and without lesions. Tongue is normal in shape, size, and color and without lesions RESPIRATORY: Breathing is even & unlabored, BS CTAB CARDIAC: RRR, no murmur,no extra heart sounds, BLE 2+ edema GI: Abdomen soft, normal BS, no masses, no tenderness EXTREMITIES:  Able to move X 4 extremities NEUROLOGICAL: There is no tremor. Speech is clear. Alert and oriented X 3. PSYCHIATRIC:  Affect and behavior are appropriate   Labs reviewed: Recent Labs    08/02/18 1400  NA 138  K 4.1  CL 106  CO2 26  GLUCOSE 148*  BUN 18  CREATININE 1.03*  CALCIUM 9.0   Recent Labs    08/02/18 1400  AST 16  ALT 8  ALKPHOS 64  BILITOT 0.8  PROT 6.1*  ALBUMIN 3.4*   Recent Labs    08/02/18 1400  WBC 8.6  NEUTROABS 6.3  HGB 12.6  HCT 39.4  MCV 100.3*  PLT 314   Lab Results  Component Value Date   TSH 1.711 08/02/2018   Lab Results  Component Value  Date   HGBA1C 6.4 (A) 07/10/2018   Lab Results  Component Value Date   CHOL 229 (H) 08/02/2018   HDL 56 08/02/2018   LDLCALC 126 (H) 08/02/2018   TRIG 237 (H) 08/02/2018   CHOLHDL 4.1 08/02/2018     Assessment/Plan  1. Bilateral lower extremity edema - has 2+ BLE edema, continue torsemide 10 mg 1 tab daily  2. Type 2 diabetes mellitus with stage 3 chronic kidney disease, without long-term current use of insulin (HCC) Lab Results  Component Value Date   HGBA1C 6.4 (A) 07/10/2018   - diet-controlled, not on any medications  3. Vitamin D deficiency -Continue vitamin D3 2000 units 1 capsule daily  4. Severe major depression, single episode, without psychotic features (  [place]) -Mood is stable, continue citalopram 10 mg 1 tab daily    Family/ staff Communication: Discussed plan of care with resident.  Labs/tests ordered: CMP, A1C and CBC  Goals of care:   Long-term care.   Durenda Age, DNP, FNP-BC [facility] [phone] (Monday-Friday 8:00 a.m. - 5:00 p.m.) [phone] (after hours)

## 2019-02-08 ENCOUNTER — Other Ambulatory Visit
Admission: RE | Admit: 2019-02-08 | Discharge: 2019-02-08 | Disposition: A | Discharge status: Home / Self Care | Payer: Medicare Other | Source: Skilled Nursing Facility | Attending: Adult Health | Admitting: Adult Health

## 2019-02-08 DIAGNOSIS — E1122 Type 2 diabetes mellitus with diabetic chronic kidney disease: Secondary | ICD-10-CM | POA: Diagnosis not present

## 2019-02-08 LAB — COMPREHENSIVE METABOLIC PANEL
ALT: 12 U/L (ref 0–44)
AST: 19 U/L (ref 15–41)
Albumin: 3.4 g/dL — ABNORMAL LOW (ref 3.5–5.0)
Alkaline Phosphatase: 73 U/L (ref 38–126)
Anion gap: 8 (ref 5–15)
BUN: 18 mg/dL (ref 8–23)
CO2: 29 mmol/L (ref 22–32)
Calcium: 9.3 mg/dL (ref 8.9–10.3)
Chloride: 103 mmol/L (ref 98–111)
Creatinine, Ser: 1.12 mg/dL — ABNORMAL HIGH (ref 0.44–1.00)
GFR calc Af Amer: 50 mL/min — ABNORMAL LOW (ref >60)
GFR calc non Af Amer: 44 mL/min — ABNORMAL LOW (ref >60)
Glucose, Bld: 147 mg/dL — ABNORMAL HIGH (ref 70–99)
Potassium: 3.8 mmol/L (ref 3.5–5.1)
Sodium: 140 mmol/L (ref 135–145)
Total Bilirubin: 0.7 mg/dL (ref 0.3–1.2)
Total Protein: 6.4 g/dL — ABNORMAL LOW (ref 6.5–8.1)

## 2019-02-08 LAB — CBC
HCT: 37.8 % (ref 36.0–46.0)
Hemoglobin: 12.6 g/dL (ref 12.0–15.0)
MCH: 32.8 pg (ref 26.0–34.0)
MCHC: 33.3 g/dL (ref 30.0–36.0)
MCV: 98.4 fL (ref 80.0–100.0)
Platelets: 293 10*3/uL (ref 150–400)
RBC: 3.84 MIL/uL — ABNORMAL LOW (ref 3.87–5.11)
RDW: 12.5 % (ref 11.5–15.5)
WBC: 9.5 10*3/uL (ref 4.0–10.5)
nRBC: 0 % (ref 0.0–0.2)

## 2019-02-08 LAB — HEMOGLOBIN A1C
Hgb A1c MFr Bld: 7.5 % — ABNORMAL HIGH (ref 4.8–5.6)
Mean Plasma Glucose: 168.55 mg/dL

## 2019-02-20 ENCOUNTER — Encounter
Admission: RE | Admit: 2019-02-20 | Discharge: 2019-02-20 | Disposition: A | Discharge status: Home / Self Care | Payer: Medicare Other | Source: Ambulatory Visit | Attending: Internal Medicine | Admitting: Internal Medicine

## 2019-03-23 ENCOUNTER — Encounter
Admission: RE | Admit: 2019-03-23 | Discharge: 2019-03-23 | Disposition: A | Discharge status: Home / Self Care | Payer: Medicare Other | Source: Ambulatory Visit | Attending: Internal Medicine | Admitting: Internal Medicine

## 2019-04-23 ENCOUNTER — Encounter
Admission: RE | Admit: 2019-04-23 | Discharge: 2019-04-23 | Disposition: A | Discharge status: Home / Self Care | Payer: Medicare Other | Source: Ambulatory Visit | Attending: Internal Medicine | Admitting: Internal Medicine

## 2019-05-02 DIAGNOSIS — Z593 Problems related to living in residential institution: Secondary | ICD-10-CM | POA: Diagnosis not present

## 2019-05-02 DIAGNOSIS — E119 Type 2 diabetes mellitus without complications: Secondary | ICD-10-CM | POA: Diagnosis not present

## 2019-05-02 DIAGNOSIS — E7800 Pure hypercholesterolemia, unspecified: Secondary | ICD-10-CM | POA: Diagnosis not present

## 2019-05-02 DIAGNOSIS — F325 Major depressive disorder, single episode, in full remission: Secondary | ICD-10-CM | POA: Diagnosis not present

## 2019-05-07 ENCOUNTER — Other Ambulatory Visit
Admission: RE | Admit: 2019-05-07 | Discharge: 2019-05-07 | Disposition: A | Discharge status: Home / Self Care | Payer: Medicare Other | Source: Ambulatory Visit | Attending: Internal Medicine | Admitting: Internal Medicine

## 2019-05-07 DIAGNOSIS — E119 Type 2 diabetes mellitus without complications: Secondary | ICD-10-CM | POA: Insufficient documentation

## 2019-05-07 LAB — COMPREHENSIVE METABOLIC PANEL
ALT: 11 U/L (ref 0–44)
AST: 14 U/L — ABNORMAL LOW (ref 15–41)
Albumin: 3.3 g/dL — ABNORMAL LOW (ref 3.5–5.0)
Alkaline Phosphatase: 65 U/L (ref 38–126)
Anion gap: 9 (ref 5–15)
BUN: 24 mg/dL — ABNORMAL HIGH (ref 8–23)
CO2: 29 mmol/L (ref 22–32)
Calcium: 9.3 mg/dL (ref 8.9–10.3)
Chloride: 104 mmol/L (ref 98–111)
Creatinine, Ser: 1.17 mg/dL — ABNORMAL HIGH (ref 0.44–1.00)
GFR calc Af Amer: 48 mL/min — ABNORMAL LOW (ref >60)
GFR calc non Af Amer: 41 mL/min — ABNORMAL LOW (ref >60)
Glucose, Bld: 194 mg/dL — ABNORMAL HIGH (ref 70–99)
Potassium: 3.7 mmol/L (ref 3.5–5.1)
Sodium: 142 mmol/L (ref 135–145)
Total Bilirubin: 0.8 mg/dL (ref 0.3–1.2)
Total Protein: 6.2 g/dL — ABNORMAL LOW (ref 6.5–8.1)

## 2019-05-07 LAB — LIPID PANEL
Cholesterol: 242 mg/dL — ABNORMAL HIGH (ref 0–200)
HDL: 56 mg/dL (ref >40)
LDL Cholesterol: 161 mg/dL — ABNORMAL HIGH (ref 0–99)
Total CHOL/HDL Ratio: 4.3 RATIO
Triglycerides: 125 mg/dL (ref <150)
VLDL: 25 mg/dL (ref 0–40)

## 2019-05-14 ENCOUNTER — Telehealth: Payer: Self-pay | Admitting: Internal Medicine

## 2019-05-14 NOTE — Telephone Encounter (Signed)
Spoke to pt sister. Wants to wait until Jan 2021 to schedule AWV. Pt is in assisted living at [facility] and in quarantine right now for covid 19.

## 2019-05-24 ENCOUNTER — Encounter
Admission: RE | Admit: 2019-05-24 | Discharge: 2019-05-24 | Disposition: A | Discharge status: Home / Self Care | Payer: Medicare Other | Source: Ambulatory Visit | Attending: Internal Medicine | Admitting: Internal Medicine

## 2019-05-27 DIAGNOSIS — L570 Actinic keratosis: Secondary | ICD-10-CM | POA: Diagnosis not present

## 2019-05-27 DIAGNOSIS — L820 Inflamed seborrheic keratosis: Secondary | ICD-10-CM | POA: Diagnosis not present

## 2019-05-27 DIAGNOSIS — X32XXXA Exposure to sunlight, initial encounter: Secondary | ICD-10-CM | POA: Diagnosis not present

## 2019-05-27 DIAGNOSIS — L538 Other specified erythematous conditions: Secondary | ICD-10-CM | POA: Diagnosis not present

## 2019-05-27 DIAGNOSIS — C44329 Squamous cell carcinoma of skin of other parts of face: Secondary | ICD-10-CM | POA: Diagnosis not present

## 2019-05-27 DIAGNOSIS — D485 Neoplasm of uncertain behavior of skin: Secondary | ICD-10-CM | POA: Diagnosis not present

## 2019-05-27 DIAGNOSIS — Z85828 Personal history of other malignant neoplasm of skin: Secondary | ICD-10-CM | POA: Diagnosis not present

## 2019-05-27 DIAGNOSIS — Z08 Encounter for follow-up examination after completed treatment for malignant neoplasm: Secondary | ICD-10-CM | POA: Diagnosis not present

## 2019-07-01 ENCOUNTER — Encounter
Admission: RE | Admit: 2019-07-01 | Discharge: 2019-07-01 | Disposition: A | Discharge status: Home / Self Care | Payer: Medicare Other | Source: Ambulatory Visit | Attending: Internal Medicine | Admitting: Internal Medicine

## 2019-07-01 DIAGNOSIS — C44329 Squamous cell carcinoma of skin of other parts of face: Secondary | ICD-10-CM | POA: Diagnosis not present

## 2019-07-11 DIAGNOSIS — F329 Major depressive disorder, single episode, unspecified: Secondary | ICD-10-CM | POA: Diagnosis not present

## 2019-07-11 DIAGNOSIS — E119 Type 2 diabetes mellitus without complications: Secondary | ICD-10-CM | POA: Diagnosis not present

## 2019-07-11 DIAGNOSIS — Z593 Problems related to living in residential institution: Secondary | ICD-10-CM | POA: Diagnosis not present

## 2019-07-11 DIAGNOSIS — Z0000 Encounter for general adult medical examination without abnormal findings: Secondary | ICD-10-CM | POA: Diagnosis not present

## 2019-07-11 DIAGNOSIS — I10 Essential (primary) hypertension: Secondary | ICD-10-CM | POA: Diagnosis not present

## 2019-08-07 DIAGNOSIS — Z20828 Contact with and (suspected) exposure to other viral communicable diseases: Secondary | ICD-10-CM | POA: Diagnosis not present

## 2019-08-12 DIAGNOSIS — Z20828 Contact with and (suspected) exposure to other viral communicable diseases: Secondary | ICD-10-CM | POA: Diagnosis not present

## 2019-08-19 DIAGNOSIS — Z20828 Contact with and (suspected) exposure to other viral communicable diseases: Secondary | ICD-10-CM | POA: Diagnosis not present

## 2019-08-30 DIAGNOSIS — Z23 Encounter for immunization: Secondary | ICD-10-CM | POA: Diagnosis not present

## 2019-09-02 DIAGNOSIS — Z20828 Contact with and (suspected) exposure to other viral communicable diseases: Secondary | ICD-10-CM | POA: Diagnosis not present

## 2019-09-09 DIAGNOSIS — Z20828 Contact with and (suspected) exposure to other viral communicable diseases: Secondary | ICD-10-CM | POA: Diagnosis not present

## 2019-09-10 ENCOUNTER — Telehealth: Payer: Self-pay | Admitting: Internal Medicine

## 2019-09-10 NOTE — Telephone Encounter (Signed)
PCP has been removed

## 2019-09-10 NOTE — Telephone Encounter (Signed)
-----   Message from Salvatore Marvel sent at 09/10/2019 10:36 AM EST ----- Regarding: removed pcp Pt now sees the doctor at her nursing home facility and no longer sees [facility].  Please remove  Thanks, Ebony Hail

## 2019-09-19 DIAGNOSIS — Z20828 Contact with and (suspected) exposure to other viral communicable diseases: Secondary | ICD-10-CM | POA: Diagnosis not present

## 2019-09-23 DIAGNOSIS — Z20828 Contact with and (suspected) exposure to other viral communicable diseases: Secondary | ICD-10-CM | POA: Diagnosis not present

## 2019-09-27 DIAGNOSIS — Z23 Encounter for immunization: Secondary | ICD-10-CM | POA: Diagnosis not present

## 2019-09-30 DIAGNOSIS — Z20828 Contact with and (suspected) exposure to other viral communicable diseases: Secondary | ICD-10-CM | POA: Diagnosis not present

## 2019-10-10 ENCOUNTER — Encounter
Admission: RE | Admit: 2019-10-10 | Discharge: 2019-10-10 | Disposition: A | Discharge status: Home / Self Care | Payer: Medicare Other | Source: Ambulatory Visit | Attending: Internal Medicine | Admitting: Internal Medicine

## 2019-10-27 IMAGING — CT CT HEAD CODE STROKE
3 series · 15 of 46 positions shown, 18 images · non-contrast
Comparison: None.

CLINICAL DATA: Code stroke. Altered level of consciousness. Code
stroke. Slurred speech. Drug overdose.

EXAM:
CT HEAD WITHOUT CONTRAST
TECHNIQUE: Contiguous axial images were obtained from the base of the skull
through the vertex without intravenous contrast.

[Series 2: head wo · axial · 0.42mm/px · z∈[+548,+668]mm · 9 of 29 slices shown, 12 images]
[im 3/29  brain]
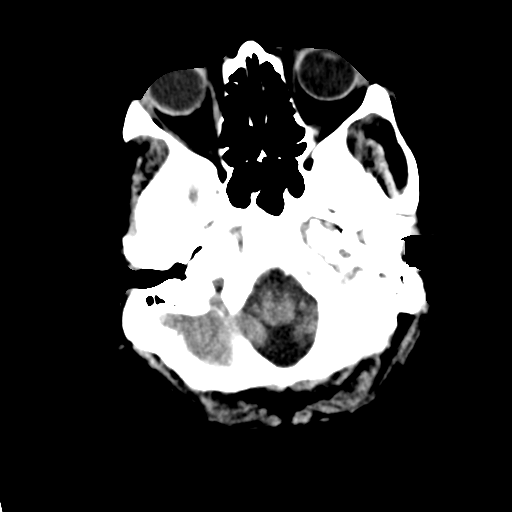
[im 3/29  bone]
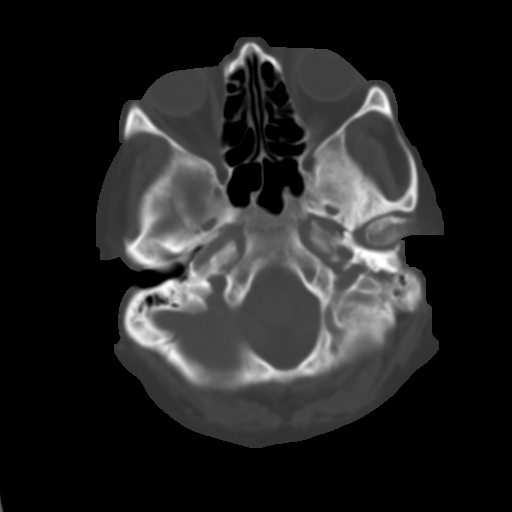
[im 6/29  brain]
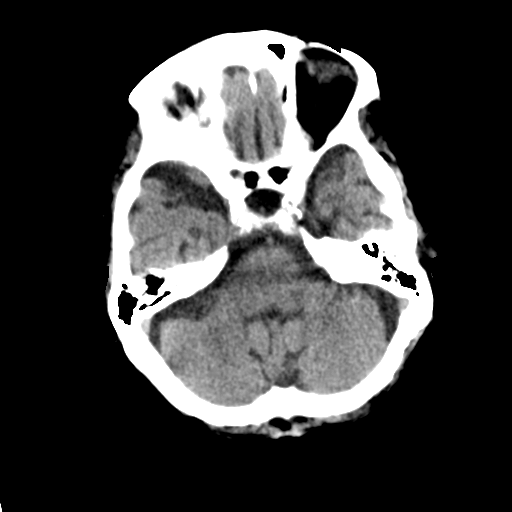
[im 9/29  brain]
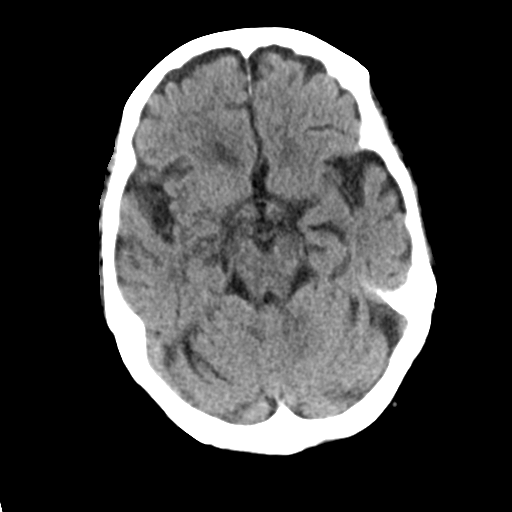
[im 12/29  brain]
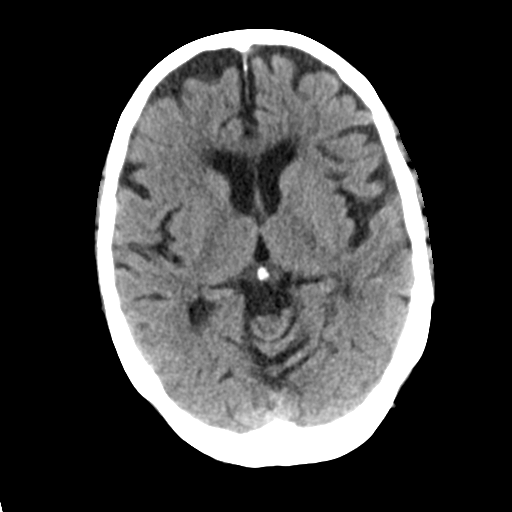
[im 15/29  brain]
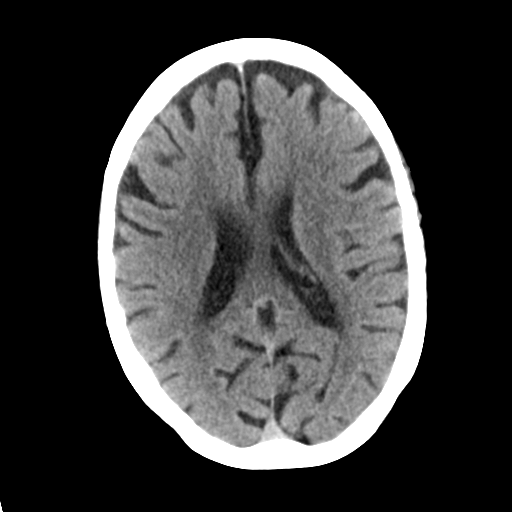
[im 15/29  bone]
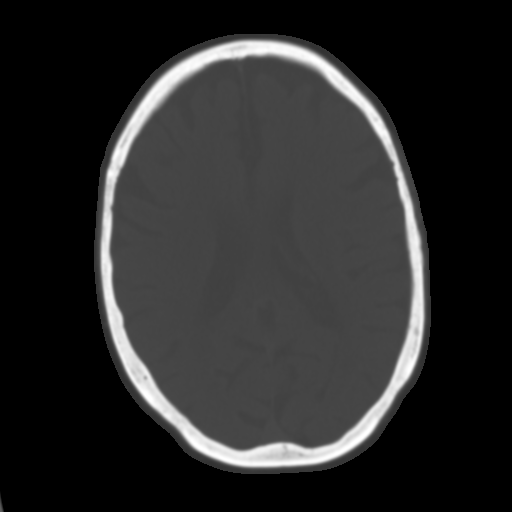
[im 18/29  brain]
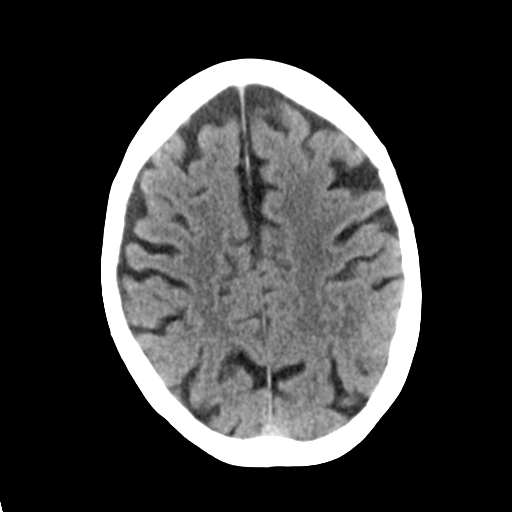
[im 21/29  brain]
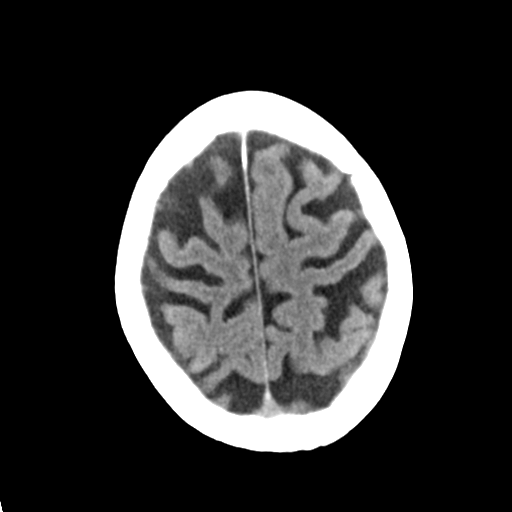
[im 24/29  brain]
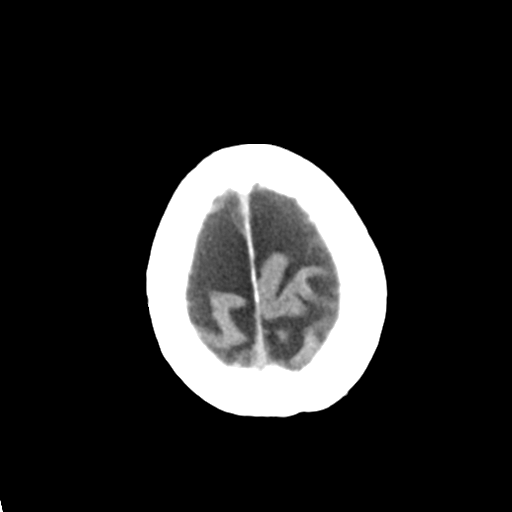
[im 27/29  brain]
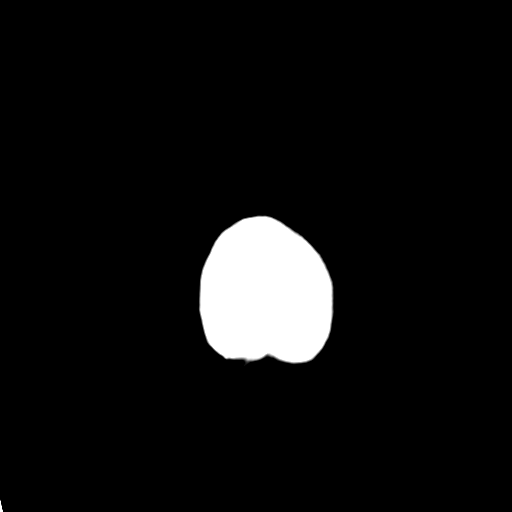
[im 27/29  bone]
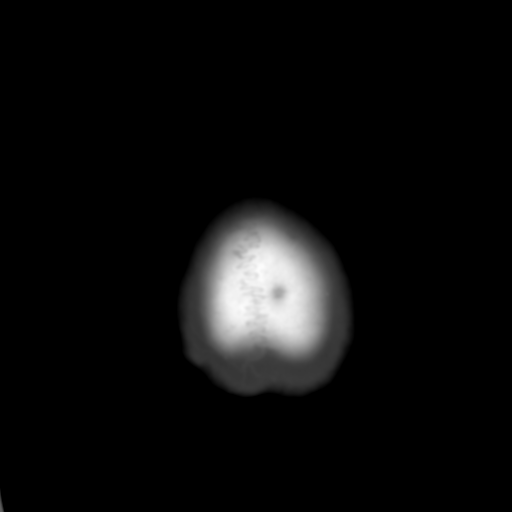

[Series 4: coronal soft tissue · coronal · 0.29mm/px · 3 of 67 slices shown]
[im 23/67  brain]
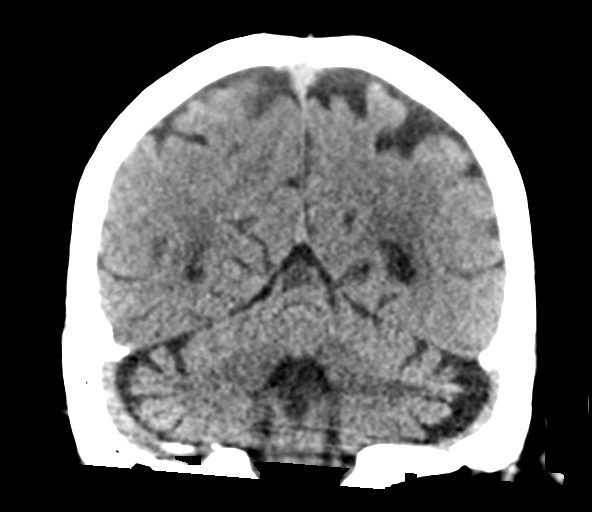
[im 30/67  brain]
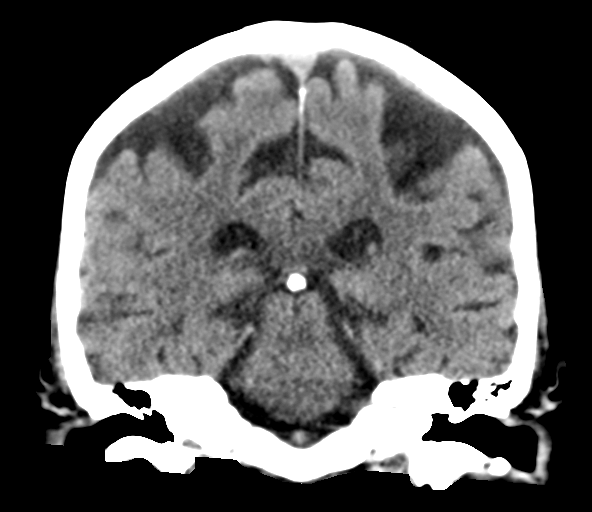
[im 37/67  brain]
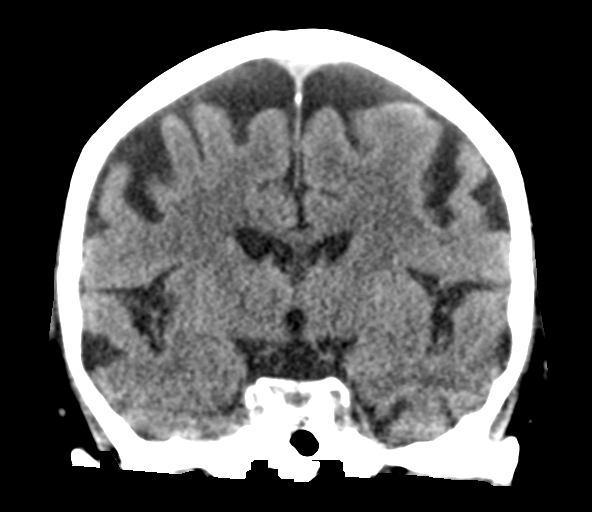

[Series 5: sagittal soft tissue · sagittal · 0.29mm/px · 3 of 58 slices shown]
[im 20/58  brain]
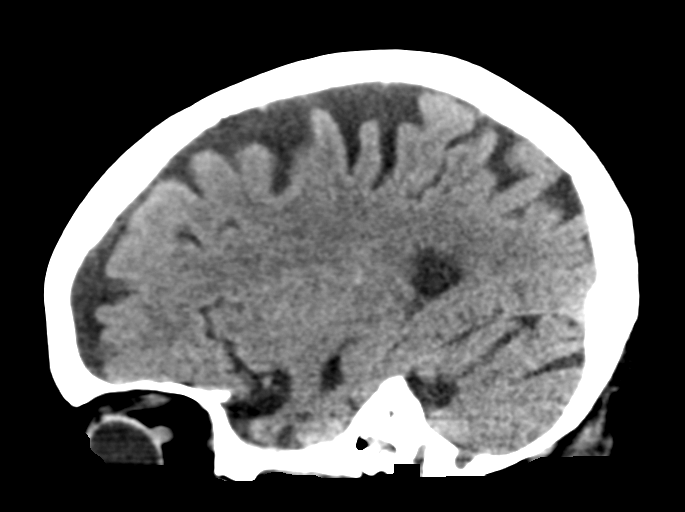
[im 29/58  brain]
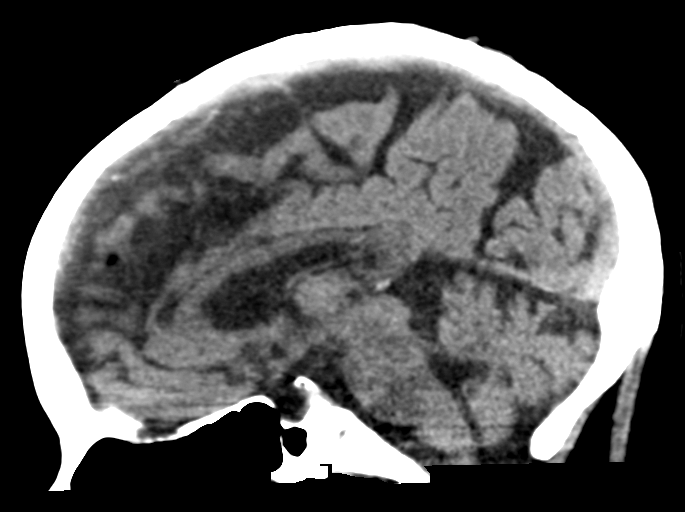
[im 39/58  brain]
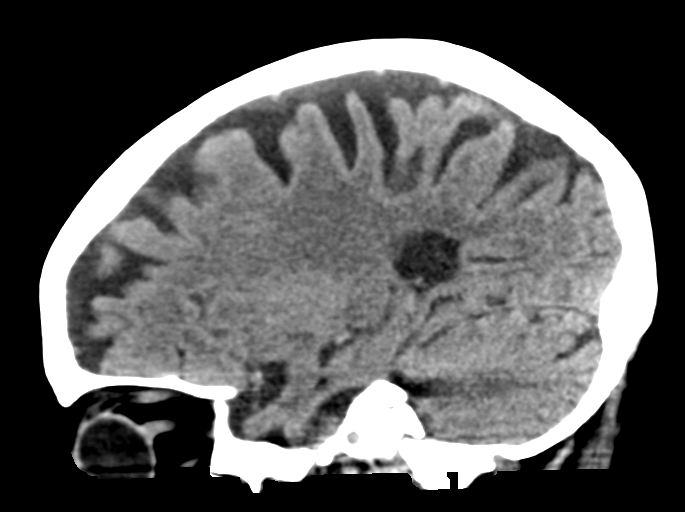

[15 of 46 positions shown; findings below may reference images not displayed]

FINDINGS: Brain: Mild atrophy and white matter changes are present
bilaterally. Basal ganglia are intact. Insular ribbon is normal
bilaterally. No acute infarct, hemorrhage, or mass lesion is
present. The ventricles are proportionate to the degree of atrophy.
No significant extra-axial fluid collection is present.

Vascular: Atherosclerotic calcifications are present in the
cavernous internal carotid arteries bilaterally. There is no
hyperdense vessel.

Skull: Calvarium is intact. No focal lytic or blastic lesions are
present.

Sinuses/Orbits: Paranasal sinuses and mastoid air cells are clear.
Bilateral lens replacements are present. Globes and orbits are
within normal limits.

ASPECTS (Alberta Stroke Program Early CT Score)

- Ganglionic level infarction (caudate, lentiform nuclei, internal
capsule, insula, M1-M3 cortex): [DATE]

- Supraganglionic infarction (M4-M6 cortex): [DATE]

Total score (0-10 with 10 being normal): [DATE]
IMPRESSION: 1. No acute intracranial abnormality.
2. Mild atrophy and white matter disease likely reflects the sequela
of chronic microvascular ischemia.
3. ASPECTS is [DATE]

These results were called by telephone at the time of interpretation
on 10/31/2017 at [DATE] to Dr. DINESH CANTU , who verbally
acknowledged these results.

## 2019-11-13 DIAGNOSIS — D0439 Carcinoma in situ of skin of other parts of face: Secondary | ICD-10-CM | POA: Diagnosis not present

## 2019-11-13 DIAGNOSIS — L118 Other specified acantholytic disorders: Secondary | ICD-10-CM | POA: Diagnosis not present

## 2019-11-13 DIAGNOSIS — Z85828 Personal history of other malignant neoplasm of skin: Secondary | ICD-10-CM | POA: Diagnosis not present

## 2019-11-13 DIAGNOSIS — Z08 Encounter for follow-up examination after completed treatment for malignant neoplasm: Secondary | ICD-10-CM | POA: Diagnosis not present

## 2019-11-13 DIAGNOSIS — D485 Neoplasm of uncertain behavior of skin: Secondary | ICD-10-CM | POA: Diagnosis not present

## 2019-11-13 DIAGNOSIS — B372 Candidiasis of skin and nail: Secondary | ICD-10-CM | POA: Diagnosis not present

## 2019-11-13 DIAGNOSIS — L570 Actinic keratosis: Secondary | ICD-10-CM | POA: Diagnosis not present

## 2019-12-05 DIAGNOSIS — F325 Major depressive disorder, single episode, in full remission: Secondary | ICD-10-CM | POA: Diagnosis not present

## 2019-12-05 DIAGNOSIS — I10 Essential (primary) hypertension: Secondary | ICD-10-CM | POA: Diagnosis not present

## 2019-12-05 DIAGNOSIS — E119 Type 2 diabetes mellitus without complications: Secondary | ICD-10-CM | POA: Diagnosis not present

## 2019-12-05 DIAGNOSIS — Z593 Problems related to living in residential institution: Secondary | ICD-10-CM | POA: Diagnosis not present

## 2019-12-23 ENCOUNTER — Encounter
Admission: RE | Admit: 2019-12-23 | Discharge: 2019-12-23 | Disposition: A | Discharge status: Home / Self Care | Payer: Medicare Other | Source: Ambulatory Visit | Attending: Internal Medicine | Admitting: Internal Medicine

## 2020-01-08 DIAGNOSIS — D0439 Carcinoma in situ of skin of other parts of face: Secondary | ICD-10-CM | POA: Diagnosis not present

## 2020-03-09 ENCOUNTER — Encounter
Admission: RE | Admit: 2020-03-09 | Discharge: 2020-03-09 | Disposition: A | Discharge status: Home / Self Care | Payer: Medicare Other | Source: Ambulatory Visit | Attending: Internal Medicine | Admitting: Internal Medicine

## 2020-03-19 DIAGNOSIS — E119 Type 2 diabetes mellitus without complications: Secondary | ICD-10-CM | POA: Diagnosis not present

## 2020-03-19 DIAGNOSIS — I10 Essential (primary) hypertension: Secondary | ICD-10-CM | POA: Diagnosis not present

## 2020-03-19 DIAGNOSIS — F325 Major depressive disorder, single episode, in full remission: Secondary | ICD-10-CM | POA: Diagnosis not present

## 2020-03-19 DIAGNOSIS — Z593 Problems related to living in residential institution: Secondary | ICD-10-CM | POA: Diagnosis not present

## 2020-03-30 DIAGNOSIS — Z20828 Contact with and (suspected) exposure to other viral communicable diseases: Secondary | ICD-10-CM | POA: Diagnosis not present

## 2020-04-06 DIAGNOSIS — Z20828 Contact with and (suspected) exposure to other viral communicable diseases: Secondary | ICD-10-CM | POA: Diagnosis not present

## 2020-05-13 DIAGNOSIS — Z20828 Contact with and (suspected) exposure to other viral communicable diseases: Secondary | ICD-10-CM | POA: Diagnosis not present

## 2020-05-18 DIAGNOSIS — Z20828 Contact with and (suspected) exposure to other viral communicable diseases: Secondary | ICD-10-CM | POA: Diagnosis not present

## 2020-06-01 DIAGNOSIS — Z23 Encounter for immunization: Secondary | ICD-10-CM | POA: Diagnosis not present

## 2020-06-15 DIAGNOSIS — Z85828 Personal history of other malignant neoplasm of skin: Secondary | ICD-10-CM | POA: Diagnosis not present

## 2020-06-15 DIAGNOSIS — D1801 Hemangioma of skin and subcutaneous tissue: Secondary | ICD-10-CM | POA: Diagnosis not present

## 2020-06-15 DIAGNOSIS — L821 Other seborrheic keratosis: Secondary | ICD-10-CM | POA: Diagnosis not present

## 2020-06-15 DIAGNOSIS — L814 Other melanin hyperpigmentation: Secondary | ICD-10-CM | POA: Diagnosis not present

## 2020-06-15 DIAGNOSIS — L570 Actinic keratosis: Secondary | ICD-10-CM | POA: Diagnosis not present

## 2020-06-30 ENCOUNTER — Encounter
Admission: RE | Admit: 2020-06-30 | Discharge: 2020-06-30 | Disposition: A | Discharge status: Home / Self Care | Payer: Medicare Other | Source: Ambulatory Visit | Attending: Internal Medicine | Admitting: Internal Medicine

## 2020-07-02 DIAGNOSIS — Z23 Encounter for immunization: Secondary | ICD-10-CM | POA: Diagnosis not present

## 2020-07-20 DIAGNOSIS — L570 Actinic keratosis: Secondary | ICD-10-CM | POA: Diagnosis not present

## 2020-07-20 DIAGNOSIS — L814 Other melanin hyperpigmentation: Secondary | ICD-10-CM | POA: Diagnosis not present

## 2020-07-20 DIAGNOSIS — Z85828 Personal history of other malignant neoplasm of skin: Secondary | ICD-10-CM | POA: Diagnosis not present

## 2020-07-20 DIAGNOSIS — L821 Other seborrheic keratosis: Secondary | ICD-10-CM | POA: Diagnosis not present

## 2020-07-29 DIAGNOSIS — Z0000 Encounter for general adult medical examination without abnormal findings: Secondary | ICD-10-CM | POA: Diagnosis not present

## 2020-07-29 DIAGNOSIS — I10 Essential (primary) hypertension: Secondary | ICD-10-CM | POA: Diagnosis not present

## 2020-07-29 DIAGNOSIS — E119 Type 2 diabetes mellitus without complications: Secondary | ICD-10-CM | POA: Diagnosis not present

## 2020-07-29 DIAGNOSIS — Z593 Problems related to living in residential institution: Secondary | ICD-10-CM | POA: Diagnosis not present

## 2024-04-19 ENCOUNTER — Other Ambulatory Visit: Payer: Self-pay

## 2024-04-19 ENCOUNTER — Inpatient Hospital Stay (HOSPITAL_COMMUNITY): Admit: 2024-04-19

## 2024-04-19 ENCOUNTER — Encounter (HOSPITAL_COMMUNITY): Payer: Self-pay

## 2024-04-19 ENCOUNTER — Encounter
Admission: EM | Disposition: A | Discharge status: Home Health | Payer: Self-pay | Source: Skilled Nursing Facility | Attending: Internal Medicine

## 2024-04-19 ENCOUNTER — Inpatient Hospital Stay
Admission: EM | Admit: 2024-04-19 | Discharge: 2024-04-23 | DRG: 322 | Disposition: A | Discharge status: Nursing Home (Includes ICF) | Source: Skilled Nursing Facility | Attending: Internal Medicine | Admitting: Internal Medicine

## 2024-04-19 DIAGNOSIS — Z8249 Family history of ischemic heart disease and other diseases of the circulatory system: Secondary | ICD-10-CM | POA: Diagnosis not present

## 2024-04-19 DIAGNOSIS — Z860100 Personal history of colon polyps, unspecified: Secondary | ICD-10-CM

## 2024-04-19 DIAGNOSIS — E785 Hyperlipidemia, unspecified: Secondary | ICD-10-CM | POA: Diagnosis present

## 2024-04-19 DIAGNOSIS — K219 Gastro-esophageal reflux disease without esophagitis: Secondary | ICD-10-CM | POA: Diagnosis present

## 2024-04-19 DIAGNOSIS — Z880 Allergy status to penicillin: Secondary | ICD-10-CM

## 2024-04-19 DIAGNOSIS — I2119 ST elevation (STEMI) myocardial infarction involving other coronary artery of inferior wall: Secondary | ICD-10-CM | POA: Diagnosis present

## 2024-04-19 DIAGNOSIS — Z6835 Body mass index (BMI) 35.0-35.9, adult: Secondary | ICD-10-CM

## 2024-04-19 DIAGNOSIS — I2129 ST elevation (STEMI) myocardial infarction involving other sites: Secondary | ICD-10-CM | POA: Diagnosis present

## 2024-04-19 DIAGNOSIS — Z888 Allergy status to other drugs, medicaments and biological substances status: Secondary | ICD-10-CM | POA: Diagnosis not present

## 2024-04-19 DIAGNOSIS — E1165 Type 2 diabetes mellitus with hyperglycemia: Secondary | ICD-10-CM | POA: Diagnosis present

## 2024-04-19 DIAGNOSIS — Z7982 Long term (current) use of aspirin: Secondary | ICD-10-CM

## 2024-04-19 DIAGNOSIS — Z83438 Family history of other disorder of lipoprotein metabolism and other lipidemia: Secondary | ICD-10-CM

## 2024-04-19 DIAGNOSIS — I2510 Atherosclerotic heart disease of native coronary artery without angina pectoris: Secondary | ICD-10-CM | POA: Diagnosis present

## 2024-04-19 DIAGNOSIS — E1122 Type 2 diabetes mellitus with diabetic chronic kidney disease: Secondary | ICD-10-CM | POA: Diagnosis present

## 2024-04-19 DIAGNOSIS — Z833 Family history of diabetes mellitus: Secondary | ICD-10-CM | POA: Diagnosis not present

## 2024-04-19 DIAGNOSIS — I129 Hypertensive chronic kidney disease with stage 1 through stage 4 chronic kidney disease, or unspecified chronic kidney disease: Secondary | ICD-10-CM | POA: Diagnosis present

## 2024-04-19 DIAGNOSIS — Z85828 Personal history of other malignant neoplasm of skin: Secondary | ICD-10-CM | POA: Diagnosis not present

## 2024-04-19 DIAGNOSIS — N179 Acute kidney failure, unspecified: Secondary | ICD-10-CM | POA: Diagnosis present

## 2024-04-19 DIAGNOSIS — Z79899 Other long term (current) drug therapy: Secondary | ICD-10-CM | POA: Diagnosis not present

## 2024-04-19 DIAGNOSIS — N1832 Chronic kidney disease, stage 3b: Secondary | ICD-10-CM | POA: Diagnosis present

## 2024-04-19 DIAGNOSIS — E860 Dehydration: Secondary | ICD-10-CM | POA: Diagnosis present

## 2024-04-19 DIAGNOSIS — E669 Obesity, unspecified: Secondary | ICD-10-CM | POA: Diagnosis present

## 2024-04-19 DIAGNOSIS — Z7984 Long term (current) use of oral hypoglycemic drugs: Secondary | ICD-10-CM | POA: Diagnosis not present

## 2024-04-19 DIAGNOSIS — Z8261 Family history of arthritis: Secondary | ICD-10-CM | POA: Diagnosis not present

## 2024-04-19 DIAGNOSIS — Z7902 Long term (current) use of antithrombotics/antiplatelets: Secondary | ICD-10-CM

## 2024-04-19 DIAGNOSIS — I213 ST elevation (STEMI) myocardial infarction of unspecified site: Principal | ICD-10-CM

## 2024-04-19 DIAGNOSIS — N189 Chronic kidney disease, unspecified: Secondary | ICD-10-CM | POA: Diagnosis not present

## 2024-04-19 DIAGNOSIS — Z66 Do not resuscitate: Secondary | ICD-10-CM | POA: Diagnosis present

## 2024-04-19 DIAGNOSIS — N183 Chronic kidney disease, stage 3 unspecified: Secondary | ICD-10-CM | POA: Diagnosis not present

## 2024-04-19 DIAGNOSIS — Z8711 Personal history of peptic ulcer disease: Secondary | ICD-10-CM

## 2024-04-19 HISTORY — PX: CORONARY/GRAFT ACUTE MI REVASCULARIZATION: CATH118305

## 2024-04-19 HISTORY — PX: LEFT HEART CATH AND CORONARY ANGIOGRAPHY: CATH118249

## 2024-04-19 LAB — CBC WITH DIFFERENTIAL/PLATELET
Abs Immature Granulocytes: 0.05 K/uL (ref 0.00–0.07)
Basophils Absolute: 0.1 K/uL (ref 0.0–0.1)
Basophils Relative: 1 %
Eosinophils Absolute: 0.2 K/uL (ref 0.0–0.5)
Eosinophils Relative: 2 %
HCT: 43.5 % (ref 36.0–46.0)
Hemoglobin: 14.3 g/dL (ref 12.0–15.0)
Immature Granulocytes: 0 %
Lymphocytes Relative: 11 %
Lymphs Abs: 1.3 K/uL (ref 0.7–4.0)
MCH: 32.6 pg (ref 26.0–34.0)
MCHC: 32.9 g/dL (ref 30.0–36.0)
MCV: 99.3 fL (ref 80.0–100.0)
Monocytes Absolute: 0.7 K/uL (ref 0.1–1.0)
Monocytes Relative: 6 %
Neutro Abs: 9.7 K/uL — ABNORMAL HIGH (ref 1.7–7.7)
Neutrophils Relative %: 80 %
Platelets: 413 K/uL — ABNORMAL HIGH (ref 150–400)
RBC: 4.38 MIL/uL (ref 3.87–5.11)
RDW: 12.6 % (ref 11.5–15.5)
WBC: 12 K/uL — ABNORMAL HIGH (ref 4.0–10.5)
nRBC: 0 % (ref 0.0–0.2)

## 2024-04-19 LAB — COMPREHENSIVE METABOLIC PANEL WITH GFR
ALT: 11 U/L (ref 0–44)
AST: 25 U/L (ref 15–41)
Albumin: 3.9 g/dL (ref 3.5–5.0)
Alkaline Phosphatase: 92 U/L (ref 38–126)
Anion gap: 14 (ref 5–15)
BUN: 15 mg/dL (ref 8–23)
CO2: 27 mmol/L (ref 22–32)
Calcium: 10.4 mg/dL — ABNORMAL HIGH (ref 8.9–10.3)
Chloride: 98 mmol/L (ref 98–111)
Creatinine, Ser: 1.23 mg/dL — ABNORMAL HIGH (ref 0.44–1.00)
GFR, Estimated: 41 mL/min — ABNORMAL LOW (ref >60)
Glucose, Bld: 253 mg/dL — ABNORMAL HIGH (ref 70–99)
Potassium: 3.9 mmol/L (ref 3.5–5.1)
Sodium: 139 mmol/L (ref 135–145)
Total Bilirubin: 1.2 mg/dL (ref 0.0–1.2)
Total Protein: 8.1 g/dL (ref 6.5–8.1)

## 2024-04-19 LAB — TROPONIN I (HIGH SENSITIVITY): Troponin I (High Sensitivity): 461 ng/L (ref <18)

## 2024-04-19 LAB — POCT ACTIVATED CLOTTING TIME
Activated Clotting Time: 377 s
Activated Clotting Time: 268 s
Activated Clotting Time: 291 s

## 2024-04-19 LAB — CG4 I-STAT (LACTIC ACID): Lactic Acid, Venous: 0.9 mmol/L (ref 0.5–1.9)

## 2024-04-19 SURGERY — CORONARY/GRAFT ACUTE MI REVASCULARIZATION
Anesthesia: Moderate Sedation

## 2024-04-19 MED ORDER — HEPARIN SODIUM (PORCINE) 1000 UNIT/ML IJ SOLN
INTRAMUSCULAR | Status: AC
  Filled 2024-04-19: qty 10

## 2024-04-19 MED ORDER — ORAL CARE MOUTH RINSE: 15.0000 mL | OROMUCOSAL | Status: DC | PRN

## 2024-04-19 MED ORDER — ONDANSETRON HCL 4 MG/2ML IJ SOLN: 4.0000 mg | Freq: Four times a day (QID) | INTRAMUSCULAR | Status: DC | PRN

## 2024-04-19 MED ORDER — NITROGLYCERIN 1 MG/10 ML FOR IR/CATH LAB
INTRA_ARTERIAL | Status: DC | PRN
  Administered 2024-04-19 (×2): 200 ug

## 2024-04-19 MED ORDER — VERAPAMIL HCL 2.5 MG/ML IV SOLN
INTRAVENOUS | Status: AC
  Filled 2024-04-19: qty 2

## 2024-04-19 MED ORDER — HEPARIN (PORCINE) IN NACL 1000-0.9 UT/500ML-% IV SOLN
INTRAVENOUS | Status: DC | PRN
  Administered 2024-04-19 (×2): 500 mL

## 2024-04-19 MED ORDER — HEPARIN SODIUM (PORCINE) 1000 UNIT/ML IJ SOLN
INTRAMUSCULAR | Status: DC | PRN
  Administered 2024-04-19: 6500 [IU] via INTRAVENOUS
  Administered 2024-04-19: 4500 [IU] via INTRAVENOUS
  Administered 2024-04-19: 2000 [IU] via INTRAVENOUS
  Administered 2024-04-19: 3000 [IU] via INTRAVENOUS

## 2024-04-19 MED ORDER — LIDOCAINE HCL (PF) 1 % IJ SOLN
INTRAMUSCULAR | Status: DC | PRN
  Administered 2024-04-19: 2 mL

## 2024-04-19 MED ORDER — CLOPIDOGREL BISULFATE 75 MG PO TABS
ORAL_TABLET | ORAL | Status: AC
  Filled 2024-04-19: qty 8

## 2024-04-19 MED ORDER — IOHEXOL 300 MG/ML  SOLN
INTRAMUSCULAR | Status: DC | PRN
  Administered 2024-04-19: 350 mL

## 2024-04-19 MED ORDER — SODIUM CHLORIDE 0.9% FLUSH
3.0000 mL | Freq: Two times a day (BID) | INTRAVENOUS | Status: DC
  Administered 2024-04-19 – 2024-04-21 (×4): 3 mL via INTRAVENOUS
  Administered 2024-04-21: 10 mL via INTRAVENOUS
  Administered 2024-04-22 – 2024-04-23 (×3): 3 mL via INTRAVENOUS

## 2024-04-19 MED ORDER — SODIUM CHLORIDE 0.9 % IV SOLN: 250.0000 mL | INTRAVENOUS | Status: AC | PRN

## 2024-04-19 MED ORDER — ASPIRIN 81 MG PO TBEC
324.0000 mg | DELAYED_RELEASE_TABLET | Freq: Once | ORAL | Status: AC
Start: 2024-04-19 — End: 2024-04-19
  Administered 2024-04-19: 324 mg via ORAL

## 2024-04-19 MED ORDER — METOPROLOL SUCCINATE ER 25 MG PO TB24
25.0000 mg | ORAL_TABLET | Freq: Every day | ORAL | Status: DC
  Administered 2024-04-20 – 2024-04-23 (×4): 25 mg via ORAL
  Filled 2024-04-19 (×4): qty 1

## 2024-04-19 MED ORDER — VERAPAMIL HCL 2.5 MG/ML IV SOLN
INTRAVENOUS | Status: DC | PRN
Start: 2024-04-19 — End: 2024-04-19
  Administered 2024-04-19 (×3): 2.5 mg via INTRAVENOUS

## 2024-04-19 MED ORDER — LIDOCAINE HCL 1 % IJ SOLN
INTRAMUSCULAR | Status: AC
  Filled 2024-04-19: qty 20

## 2024-04-19 MED ORDER — MIDAZOLAM HCL 2 MG/2ML IJ SOLN
INTRAMUSCULAR | Status: AC
  Filled 2024-04-19: qty 2

## 2024-04-19 MED ORDER — CLOPIDOGREL BISULFATE 75 MG PO TABS
ORAL_TABLET | ORAL | Status: DC | PRN
  Administered 2024-04-19: 600 mg via ORAL

## 2024-04-19 MED ORDER — HEPARIN (PORCINE) IN NACL 1000-0.9 UT/500ML-% IV SOLN
INTRAVENOUS | Status: AC
Start: 2024-04-19 — End: 2024-04-19
  Filled 2024-04-19: qty 500

## 2024-04-19 MED ORDER — HYDRALAZINE HCL 20 MG/ML IJ SOLN: 10.0000 mg | INTRAMUSCULAR | Status: DC | PRN

## 2024-04-19 MED ORDER — ACETAMINOPHEN 325 MG PO TABS: 650.0000 mg | ORAL_TABLET | ORAL | Status: DC | PRN

## 2024-04-19 MED ORDER — HEPARIN (PORCINE) IN NACL 1000-0.9 UT/500ML-% IV SOLN
INTRAVENOUS | Status: AC
  Filled 2024-04-19: qty 1000

## 2024-04-19 MED ORDER — FENTANYL CITRATE (PF) 100 MCG/2ML IJ SOLN
INTRAMUSCULAR | Status: AC
  Filled 2024-04-19: qty 2

## 2024-04-19 MED ORDER — FENTANYL CITRATE (PF) 100 MCG/2ML IJ SOLN
INTRAMUSCULAR | Status: DC | PRN
  Administered 2024-04-19 (×2): 12.5 ug via INTRAVENOUS

## 2024-04-19 MED ORDER — CLOPIDOGREL BISULFATE 75 MG PO TABS
75.0000 mg | ORAL_TABLET | Freq: Every day | ORAL | Status: DC
  Administered 2024-04-20 – 2024-04-23 (×4): 75 mg via ORAL
  Filled 2024-04-19 (×4): qty 1

## 2024-04-19 MED ORDER — CLOPIDOGREL BISULFATE 75 MG PO TABS
ORAL_TABLET | ORAL | Status: AC
  Filled 2024-04-19: qty 1

## 2024-04-19 MED ORDER — FREE WATER
500.0000 mL | Freq: Once | Status: AC
  Administered 2024-04-19: 500 mL via ORAL

## 2024-04-19 MED ORDER — ASPIRIN 81 MG PO CHEW
81.0000 mg | CHEWABLE_TABLET | Freq: Every day | ORAL | Status: DC
  Administered 2024-04-20 – 2024-04-23 (×4): 81 mg via ORAL
  Filled 2024-04-19 (×4): qty 1

## 2024-04-19 MED ORDER — CHLORHEXIDINE GLUCONATE CLOTH 2 % EX PADS
6.0000 | MEDICATED_PAD | Freq: Every day | CUTANEOUS | Status: DC
  Administered 2024-04-20 – 2024-04-21 (×2): 6 via TOPICAL

## 2024-04-19 MED ORDER — NITROGLYCERIN 1 MG/10 ML FOR IR/CATH LAB
INTRA_ARTERIAL | Status: AC
  Filled 2024-04-19: qty 10

## 2024-04-19 MED ORDER — LABETALOL HCL 5 MG/ML IV SOLN
10.0000 mg | INTRAVENOUS | Status: DC | PRN
  Administered 2024-04-20: 10 mg via INTRAVENOUS
  Filled 2024-04-19: qty 4

## 2024-04-19 MED ORDER — SODIUM CHLORIDE 0.9% FLUSH: 3.0000 mL | INTRAVENOUS | Status: DC | PRN

## 2024-04-19 SURGICAL SUPPLY — 25 items
BALLOON TREK RX 2.25X12 (BALLOONS) IMPLANT
BALLOON TREK RX 2.5X15 (BALLOONS) IMPLANT
BALLOON TREK RX 2.75X15 (BALLOONS) IMPLANT
BALLOON ~~LOC~~ EUPHORA RX 2.75X20 (BALLOONS) IMPLANT
BALLOON ~~LOC~~ TREK NEO RX 2.75X15 (BALLOONS) IMPLANT
CATH INFINITI 5 FR JL3.5 (CATHETERS) IMPLANT
CATH INFINITI JR4 5F (CATHETERS) IMPLANT
CATH TELESCOPE 6F GEC (CATHETERS) IMPLANT
CATH VISTA GUIDE 6FR JL4 ECOPK (CATHETERS) IMPLANT
CATH VISTA GUIDE 6FR XB3.5 EPK (CATHETERS) IMPLANT
DEVICE RAD TR BAND REGULAR (VASCULAR PRODUCTS) IMPLANT
DRAPE BRACHIAL (DRAPES) IMPLANT
GLIDESHEATH SLEND SS 6F .021 (SHEATH) IMPLANT
GUIDEWIRE INQWIRE 1.5J.035X260 (WIRE) IMPLANT
KIT ENCORE 26 ADVANTAGE (KITS) IMPLANT
KIT SYRINGE INJ CVI SPIKEX1 (MISCELLANEOUS) IMPLANT
PACK CARDIAC CATH (CUSTOM PROCEDURE TRAY) ×1 IMPLANT
SET ATX-X65L (MISCELLANEOUS) IMPLANT
STATION PROTECTION PRESSURIZED (MISCELLANEOUS) IMPLANT
STENT ONYX FRONTIER 2.25X12 (Permanent Stent) IMPLANT
STENT ONYX FRONTIER 2.5X22 (Permanent Stent) IMPLANT
TUBING CIL FLEX 10 FLL-RA (TUBING) IMPLANT
WIRE ASAHI PROWATER 180CM (WIRE) IMPLANT
WIRE G HI TQ BMW 190 (WIRE) IMPLANT
WIRE HITORQ VERSACORE ST 145CM (WIRE) IMPLANT

## 2024-04-19 NOTE — Consult Note (Signed)
 [facility] Cardiology  CARDIOLOGY CONSULT NOTE  Patient ID: Lisa Campbell MRN: [id number] DOB/AGE: 1927-12-01 89 y.o.  Admit date: 04/19/2024 Referring Physician [facility] Primary Physician  Primary Cardiologist  Reason for Consultation inferolateral STEMI  HPI: 89 year old female referred for inferolateral STEMI.  The patient is a resident of [facility].  She was in her usual state of health until night when she developed sided chest pain.  She presented to [facility] ED where ECG revealed ST elevations in leads II, III and aVF, V 4-V6.  The patient was brought to the cardiac catheterization laboratory where coronary angiography revealed three-vessel coronary artery disease with 75% stenosis proximal LAD, chronically occluded proximal RCA, thrombotic 95% stenosis proximal left circumflex, and occluded OM1 ( likely culprit vessel).  The patient underwent primary PCI receiving 2.25 x 12 mm DES OM1, and 2.5 x 22 mm DES proximal left circumflex.  Left ventriculography revealed mild to moderate reduced left ventricular function with estimated LVEF 35-40%.  Review of systems complete and found to be negative unless listed above     Past Medical History:  Diagnosis Date   Arthritis    Benign essential hypertension 04/12/2018   Cancer (HCC)    skin mohs left ear ? BCC also h/o SCC right upper arm and right lower leg    Colon polyps    Gastric ulcer    GERD (gastroesophageal reflux disease)    Hyperlipidemia    Hypertension    Rotator cuff disorder    right   Shingles     Past Surgical History:  Procedure Laterality Date   KNEE ARTHROSCOPY     b/l     Medications Prior to Admission  Medication Sig Dispense Refill Last Dose/Taking   acetaminophen  (TYLENOL ) 325 MG tablet Take 650 mg by mouth every 4 (four) hours as needed. for pain and/or increased temp. May be administered orally (via gastric tube if present), or rectally - if patient unable to take orally.      Cholecalciferol  (VITAMIN D3) 2000 units TABS Take 2,000 Units by mouth daily. 30 tablet 1    citalopram  (CELEXA ) 10 MG tablet Take 1 tablet (10 mg total) by mouth daily. 30 tablet 1    cyanocobalamin  1000 MCG tablet Take 1,000 mcg by mouth daily.      ipratropium (ATROVENT) 0.03 % nasal spray Place 2 sprays into both nostrils 2 (two) times daily.      Magnesium  Oxide 250 MG TABS Take 1 tablet by mouth daily.      torsemide  (DEMADEX ) 10 MG tablet Take 1 tablet (10 mg total) by mouth daily. 90 tablet 1    traZODone  (DESYREL ) 50 MG tablet Take 50 mg by mouth at bedtime as needed.       UNABLE TO FIND Diet - Liquids: _X__Regular;      Social History   Socioeconomic History   Marital status: Single    Spouse name: Not on file   Number of children: Not on file   Years of education: Not on file   Highest education level: Not on file  Occupational History   Occupation: retired  Tobacco Use   Smoking status: Never   Smokeless tobacco: Never  Vaping Use   Vaping status: Never Used  Substance and Sexual Activity   Alcohol use: Yes   Drug use: No   Sexual activity: Never  Other Topics Concern   Not on file  Social History Narrative   Retired former Scientist, forensic, assoc. Degree  Moved from [place]    Wears seat belts, no guns at home    No exercise    Code status DNR   Social Drivers of Health   Financial Resource Strain: Not on file  Food Insecurity: Not on file  Transportation Needs: Not on file  Physical Activity: Not on file  Stress: Not on file  Social Connections: Not on file  Intimate Partner Violence: Not on file    Family History  Problem Relation Age of Onset   Cancer Mother        ?colon    Diabetes Mother    Early death Mother    Heart disease Mother    Hypertension Mother    Hyperlipidemia Mother    Cancer Father        ? MM   Diabetes Sister    Diabetes Brother    Arthritis Brother       Review of systems complete and found to be negative unless listed  above      PHYSICAL EXAM  General: Well developed, well nourished, in no acute distress HEENT:  Normocephalic and atramatic Neck:  No JVD.  Lungs: Clear bilaterally to auscultation and percussion. Heart: HRRR . Normal S1 and S2 without gallops or murmurs.  Abdomen: Bowel sounds are positive, abdomen soft and non-tender  Msk:  Back normal, normal gait. Normal strength and tone for age. Extremities: No clubbing, cyanosis or edema.   Neuro: Alert and oriented X 3. Psych:  Good affect, responds appropriately  Labs:   Lab Results  Component Value Date   WBC 12.0 (H) 04/19/2024   HGB 14.3 04/19/2024   HCT 43.5 04/19/2024   MCV 99.3 04/19/2024   PLT 413 (H) 04/19/2024    Recent Labs  Lab 04/19/24 1927  NA 139  K 3.9  CL 98  CO2 27  BUN 15  CREATININE 1.23*  CALCIUM 10.4*  PROT 8.1  BILITOT 1.2  ALKPHOS 92  ALT 11  AST 25  GLUCOSE 253*   No results found for: CKTOTAL, CKMB, CKMBINDEX, TROPONINI  Lab Results  Component Value Date   CHOL 242 (H) 05/07/2019   CHOL 229 (H) 08/02/2018   CHOL 243 (H) 11/30/2017   Lab Results  Component Value Date   HDL 56 05/07/2019   HDL 56 08/02/2018   HDL 61 11/30/2017   Lab Results  Component Value Date   LDLCALC 161 (H) 05/07/2019   LDLCALC 126 (H) 08/02/2018   LDLCALC 153 (H) 11/30/2017   Lab Results  Component Value Date   TRIG 125 05/07/2019   TRIG 237 (H) 08/02/2018   TRIG 145 11/30/2017   Lab Results  Component Value Date   CHOLHDL 4.3 05/07/2019   CHOLHDL 4.1 08/02/2018   CHOLHDL 4.0 11/30/2017   No results found for: LDLDIRECT    Radiology: CARDIAC CATHETERIZATION Result Date: 04/19/2024   1st Mrg lesion is 100% stenosed.   Prox Cx lesion is 95% stenosed.   Mid Cx to Dist Cx lesion is 75% stenosed.   Prox LAD lesion is 75% stenosed.   Mid LAD lesion is 60% stenosed.   Prox RCA lesion is 100% stenosed.   A drug-eluting stent was successfully placed using a STENT ONYX FRONTIER 2.25X12.   A  drug-eluting stent was successfully placed using a STENT ONYX FRONTIER 2.5X22.   Post intervention, there is a 0% residual stenosis.   Post intervention, there is a 0% residual stenosis.   There is mild to moderate left  ventricular systolic dysfunction.   LV end diastolic pressure is moderately elevated.   The left ventricular ejection fraction is 35-45% by visual estimate. 1.  Inferior lateral STEMI 2.  Three-vessel coronary artery disease with 75% ectatic stenosis proximal LAD, thrombotic 95% stenosis proximal left circumflex, acute occlusion of OM1, chronic occlusion of proximal RCA with left-to-right collaterals 3.  Mild to moderate reduced left ventricular function with estimate LVEF 35-40% with anterior apical and inferior apical akinesis 4.  Successful primary PCI with 2.25 x 12 mm Onyx frontier DES OM1 and 2.5 x 22 mm Onyx frontier DES proximal left circumflex Recommendations 1.  Dual antiplatelet therapy uninterrupted x 1 year 2.  Start metoprolol  succinate 25 mg daily 3.  Start atorvastatin 80 mg daily 4.  2D echocardiogram 5.  Further recommendations pending 2D echocardiogram results   EKG: Sinus rhythm with diagnostic ST elevations leads II, III and aVF, V4-V6  ASSESSMENT AND PLAN:   1.  Inferolateral STEMI, status post primary PCI with DES OM1 and proximal left circumflex 2.  Three-vessel coronary artery disease (75% stenosis proximal LAD, chronically occluded proximal RCA, thrombotic 95% stenosis proximal left circumflex, acute 100% stenosis OM1 ) 3.  Mild to moderate reduced left ventricular function 4.  Essential hypertension 5.  Hyperlipidemia, with history of statin intolerance 6.  Type 2 diabetes 7.  Chronic kidney disease stage III  Recommendations  1.  Dual antiplatelet therapy uninterrupted x 1 year (aspirin  and clopidogrel ) 2.  Start metoprolol  succinate 25 mg daily 3.  2D echocardiogram 4.  Further recommendations pending 2D echocardiogram results 5.  Consider PCSK9  inhibitor if patient has significant statin intolerance  Signed: Marsa Dooms MD,PhD, FACC 04/19/2024, 11:01 PM

## 2024-04-19 NOTE — ED Notes (Signed)
 Siadecki, MD at bedside at this time speaking with cardiology.

## 2024-04-19 NOTE — Progress Notes (Signed)
   04/19/24 2100  Spiritual Encounters  Type of Visit Initial  Care provided to: Pt and family  Referral source Code page  Reason for visit Code  OnCall Visit Yes  Interventions  Spiritual Care Interventions Made Established relationship of care and support;Compassionate presence;Encouragement  Intervention Outcomes  Outcomes Connection to spiritual care   Chaplain provided compassionate presence and extended encouragement. Patient was heading to Cath Lab for a procedure.

## 2024-04-19 NOTE — ED Provider Notes (Signed)
 [facility] Provider Note    Event Date/Time   First MD Initiated Contact with Patient 04/19/24 1921     (approximate)   History   Hypertension   HPI  Lisa Campbell is a 89 y.o. female with a history of hypertension and chronic kidney disease who presents with arm and chest discomfort, acute onset approximately 30 minutes prior to coming to the ED.  The patient states that she was just sitting and was not exerting herself.  She started to have a funny feeling in her left arm that then moved to her chest.  She describes it as a discomfort but not quite pain.  She denies associated shortness of breath, lightheadedness, nausea or vomiting.  She denies any prior history of this pain.  She has no prior cardiac history.  I reviewed the past medical records.  The patient has no recent records in our system.  She was seen by internal medicine in 2020 for medical management of her chronic conditions.   Physical Exam   Triage Vital Signs: ED Triage Vitals  Encounter Vitals Group     BP 04/19/24 1924 (!) 193/96     Girls Systolic BP Percentile --      Girls Diastolic BP Percentile --      Boys Systolic BP Percentile --      Boys Diastolic BP Percentile --      Pulse Rate 04/19/24 1921 92     Resp 04/19/24 1921 18     Temp 04/19/24 1924 97.9 F (36.6 C)     Temp Source 04/19/24 1924 Oral     SpO2 04/19/24 1921 97 %     Weight 04/19/24 1922 189 lb 4.8 oz (85.9 kg)     Height --      Head Circumference --      Peak Flow --      Pain Score 04/19/24 1921 0     Pain Loc --      Pain Education --      Exclude from Growth Chart --     Most recent vital signs: Vitals:   04/19/24 1956 04/19/24 2000  BP: (!) 175/100 (!) 190/104  Pulse: 94 94  Resp: 16 (!) 22  Temp:    SpO2: 96% 96%    General: Alert, very well-appearing for age, no distress. CV:  Good peripheral perfusion.  Normal heart sounds. Resp:  Normal effort.  Lungs CTAB. Abd:  No  distention.  Other:  No peripheral edema.   ED Results / Procedures / Treatments   Labs (all labs ordered are listed, but only abnormal results are displayed) Labs Reviewed  COMPREHENSIVE METABOLIC PANEL WITH GFR - Abnormal; Notable for the following components:      Result Value   Glucose, Bld 253 (*)    Creatinine, Ser 1.23 (*)    Calcium 10.4 (*)    GFR, Estimated 41 (*)    All other components within normal limits  CBC WITH DIFFERENTIAL/PLATELET - Abnormal; Notable for the following components:   WBC 12.0 (*)    Platelets 413 (*)    Neutro Abs 9.7 (*)    All other components within normal limits  TROPONIN I (HIGH SENSITIVITY) - Abnormal; Notable for the following components:   Troponin I (High Sensitivity) 461 (*)    All other components within normal limits  TROPONIN I (HIGH SENSITIVITY)     EKG  ED ECG REPORT I, Waylon Cassis, the attending physician, personally viewed  and interpreted this ECG.  Date: 04/19/2024 EKG Time: 1924 Rate: 90 Rhythm: normal sinus rhythm QRS Axis: normal Intervals: normal ST/T Wave abnormalities: ST elevations in the inferior leads with depressions anteriorly Narrative Interpretation: STEMI    RADIOLOGY    PROCEDURES:  Critical Care performed: Yes, see critical care procedure note(s)  .Critical Care  Performed by: Jacolyn Pae, MD Authorized by: Jacolyn Pae, MD   Critical care provider statement:    Critical care time (minutes):  30   Critical care time was exclusive of:  Separately billable procedures and treating other patients   Critical care was necessary to treat or prevent imminent or life-threatening deterioration of the following conditions:  Cardiac failure   Critical care was time spent personally by me on the following activities:  Development of treatment plan with patient or surrogate, discussions with consultants, evaluation of patient's response to treatment, examination of patient, ordering  and review of laboratory studies, ordering and review of radiographic studies, ordering and performing treatments and interventions, pulse oximetry, re-evaluation of patient's condition, review of old charts and obtaining history from patient or surrogate   Care discussed with: admitting provider      MEDICATIONS ORDERED IN ED: Medications  Heparin  (Porcine) in NaCl 1000-0.9 UT/500ML-% SOLN (500 mLs  Given 04/19/24 2034)  lidocaine  (PF) (XYLOCAINE ) 1 % injection (2 mLs Other Given 04/19/24 2035)  verapamil  (ISOPTIN ) injection (2.5 mg Intravenous Given 04/19/24 2039)  heparin  sodium (porcine) injection (2,000 Units Intravenous Given 04/19/24 2111)  fentaNYL  (SUBLIMAZE ) injection (12.5 mcg Intravenous Given 04/19/24 2101)  clopidogrel  (PLAVIX ) tablet (600 mg Oral Given 04/19/24 2107)  aspirin  EC tablet 324 mg (324 mg Oral Given 04/19/24 2012)     IMPRESSION / MDM / ASSESSMENT AND PLAN / ED COURSE  I reviewed the triage vital signs and the nursing notes.  89 year old female with PMH as noted above presents with left arm and chest discomfort, acute onset this evening.  On exam she is well-appearing.  She is hypertensive with otherwise normal vital signs.  There are no other focal exam findings.  EKG shows findings concerning for possible STEMI, although not clearly above 2 mm in adjacent leads.  Differential diagnosis includes, but is not limited to, STEMI, other ACS, hypertensive crisis, neuropathic or musculoskeletal chest discomfort.  Patient's presentation is most consistent with acute presentation with potential threat to life or bodily function.  The patient is on the cardiac monitor to evaluate for evidence of arrhythmia and/or significant heart rate changes.  I open the patient's chart and saw the EKG at 1928.  I then immediately went to evaluate the patient.  After talking to her, I immediately paged Dr. Ammon from cardiology who is covering for STEMI.  I consulted and discussed the  case with Dr. Ammon between approximately [phone]; he advised to activate code STEMI if the patient was amenable to catheterization.  I talked to the patient who confirmed that she would want cardiac cath and code STEMI was activated immediately.  ----------------------------------------- 8:15 PM on 04/19/2024 -----------------------------------------  The patient is going up to the Cath Lab.  I placed STEMI orders, although there was a delay because I initially had accidentally placed these orders on another patient.  Medication orders were placed by the STEMI team, and the orders were canceled on the other patient.   FINAL CLINICAL IMPRESSION(S) / ED DIAGNOSES   Final diagnoses:  ST elevation myocardial infarction (STEMI), unspecified artery (HCC)     Rx / DC Orders   ED  Discharge Orders     None        Note:  This document was prepared using Dragon voice recognition software and may include unintentional dictation errors.    Jacolyn Pae, MD 04/19/24 2145

## 2024-04-19 NOTE — ED Triage Notes (Signed)
 BIB [facility] after patient called out for a funny feeling in her left arm that radiated up into her chest. Denied any chest pain, pressure, or tingling in the left arm. Patient is unable to describe the feeling in her left arm, but EMS states that she was hypertensive in route. Hx of hypertension.

## 2024-04-19 NOTE — ED Notes (Signed)
 Patient transported to cath lab at this time with this RN, Vernell, RN, and Paraschos, MD.

## 2024-04-19 NOTE — ED Notes (Signed)
 Per Dr. Jacolyn asked me to activate a code STEMI for the patient. Called [facility] and spoke with Ruby and she activated the CODE STEMI

## 2024-04-19 NOTE — ED Notes (Addendum)
 Paraschos, MD (Cardiology) to bedside at this time.

## 2024-04-19 NOTE — ED Notes (Signed)
 This RN asked Dr. Jacolyn at this time if he wanted to order any medications. MD to place orders for meds at this time.

## 2024-04-19 NOTE — ED Notes (Signed)
 Patient placed on pads and zoll at this time. Siadecki, MD calling cardiology at this time. Awaiting orders. Attempting to get a second peripheral IV at this time.

## 2024-04-19 NOTE — Progress Notes (Signed)
 eLink Physician-Brief Progress Note Patient Name: Felicita Nuncio DOB: 08-Jul-1928 MRN: [id number]   Date of Service  04/19/2024  HPI/Events of Note  Patient admitted with infero-lateral STEMI s/p cardiac catheterization with PCI + DES.  eICU Interventions  New Patient Evaluation.        Deven Audi U Asmar Brozek 04/19/2024, 11:25 PM

## 2024-04-20 ENCOUNTER — Inpatient Hospital Stay
Admit: 2024-04-20 | Discharge: 2024-04-20 | Disposition: A | Discharge status: Home / Self Care | Attending: Cardiology | Admitting: Cardiology

## 2024-04-20 ENCOUNTER — Encounter: Payer: Self-pay | Admitting: Cardiology

## 2024-04-20 DIAGNOSIS — N183 Chronic kidney disease, stage 3 unspecified: Secondary | ICD-10-CM | POA: Diagnosis not present

## 2024-04-20 DIAGNOSIS — N1831 Chronic kidney disease, stage 3a: Secondary | ICD-10-CM

## 2024-04-20 DIAGNOSIS — I2119 ST elevation (STEMI) myocardial infarction involving other coronary artery of inferior wall: Secondary | ICD-10-CM

## 2024-04-20 DIAGNOSIS — I2129 ST elevation (STEMI) myocardial infarction involving other sites: Secondary | ICD-10-CM

## 2024-04-20 DIAGNOSIS — E1122 Type 2 diabetes mellitus with diabetic chronic kidney disease: Secondary | ICD-10-CM

## 2024-04-20 DIAGNOSIS — N189 Chronic kidney disease, unspecified: Secondary | ICD-10-CM

## 2024-04-20 DIAGNOSIS — N179 Acute kidney failure, unspecified: Secondary | ICD-10-CM

## 2024-04-20 LAB — CBC WITH DIFFERENTIAL/PLATELET
Abs Immature Granulocytes: 0.09 K/uL — ABNORMAL HIGH (ref 0.00–0.07)
Basophils Absolute: 0.1 K/uL (ref 0.0–0.1)
Basophils Relative: 0 %
Eosinophils Absolute: 0 K/uL (ref 0.0–0.5)
Eosinophils Relative: 0 %
HCT: 35.5 % — ABNORMAL LOW (ref 36.0–46.0)
Hemoglobin: 11.8 g/dL — ABNORMAL LOW (ref 12.0–15.0)
Immature Granulocytes: 1 %
Lymphocytes Relative: 12 %
Lymphs Abs: 1.7 K/uL (ref 0.7–4.0)
MCH: 32.9 pg (ref 26.0–34.0)
MCHC: 33.2 g/dL (ref 30.0–36.0)
MCV: 98.9 fL (ref 80.0–100.0)
Monocytes Absolute: 1.1 K/uL — ABNORMAL HIGH (ref 0.1–1.0)
Monocytes Relative: 8 %
Neutro Abs: 10.5 K/uL — ABNORMAL HIGH (ref 1.7–7.7)
Neutrophils Relative %: 79 %
Platelets: 363 K/uL (ref 150–400)
RBC: 3.59 MIL/uL — ABNORMAL LOW (ref 3.87–5.11)
RDW: 12.7 % (ref 11.5–15.5)
WBC: 13.4 K/uL — ABNORMAL HIGH (ref 4.0–10.5)
nRBC: 0 % (ref 0.0–0.2)

## 2024-04-20 LAB — BASIC METABOLIC PANEL WITH GFR
Anion gap: 10 (ref 5–15)
BUN: 14 mg/dL (ref 8–23)
CO2: 26 mmol/L (ref 22–32)
Calcium: 9.1 mg/dL (ref 8.9–10.3)
Chloride: 100 mmol/L (ref 98–111)
Creatinine, Ser: 0.83 mg/dL (ref 0.44–1.00)
GFR, Estimated: >60 mL/min (ref >60)
Glucose, Bld: 208 mg/dL — ABNORMAL HIGH (ref 70–99)
Potassium: 3.9 mmol/L (ref 3.5–5.1)
Sodium: 136 mmol/L (ref 135–145)

## 2024-04-20 LAB — GLUCOSE, CAPILLARY
Glucose-Capillary: 221 mg/dL — ABNORMAL HIGH (ref 70–99)
Glucose-Capillary: 212 mg/dL — ABNORMAL HIGH (ref 70–99)
Glucose-Capillary: 206 mg/dL — ABNORMAL HIGH (ref 70–99)
Glucose-Capillary: 191 mg/dL — ABNORMAL HIGH (ref 70–99)
Glucose-Capillary: 238 mg/dL — ABNORMAL HIGH (ref 70–99)

## 2024-04-20 LAB — LIPID PANEL
Cholesterol: 215 mg/dL — ABNORMAL HIGH (ref 0–200)
HDL: 56 mg/dL (ref >40)
LDL Cholesterol: 121 mg/dL — ABNORMAL HIGH (ref 0–99)
Total CHOL/HDL Ratio: 3.8 ratio
Triglycerides: 192 mg/dL — ABNORMAL HIGH (ref <150)
VLDL: 38 mg/dL (ref 0–40)

## 2024-04-20 LAB — ECHOCARDIOGRAM COMPLETE
AR max vel: 1.42 cm2
AV Peak grad: 10.1 mmHg
Ao pk vel: 1.59 m/s
Area-P 1/2: 4.63 cm2
Height: 61 in
S' Lateral: 3 cm
Weight: 3021.54 [oz_av]

## 2024-04-20 LAB — HEPATIC FUNCTION PANEL
ALT: 28 U/L (ref 0–44)
AST: 173 U/L — ABNORMAL HIGH (ref 15–41)
Albumin: 3.1 g/dL — ABNORMAL LOW (ref 3.5–5.0)
Alkaline Phosphatase: 74 U/L (ref 38–126)
Bilirubin, Direct: 0.2 mg/dL (ref 0.0–0.2)
Indirect Bilirubin: 0.7 mg/dL (ref 0.3–0.9)
Total Bilirubin: 0.9 mg/dL (ref 0.0–1.2)
Total Protein: 6.4 g/dL — ABNORMAL LOW (ref 6.5–8.1)

## 2024-04-20 LAB — TROPONIN I (HIGH SENSITIVITY): Troponin I (High Sensitivity): >24000 ng/L (ref <18)

## 2024-04-20 LAB — MRSA NEXT GEN BY PCR, NASAL: MRSA by PCR Next Gen: NOT DETECTED

## 2024-04-20 MED ORDER — INSULIN ASPART 100 UNIT/ML IJ SOLN
0.0000 [IU] | Freq: Three times a day (TID) | INTRAMUSCULAR | Status: DC
  Administered 2024-04-20 (×2): 3 [IU] via SUBCUTANEOUS
  Administered 2024-04-20: 2 [IU] via SUBCUTANEOUS
  Administered 2024-04-21: 3 [IU] via SUBCUTANEOUS
  Administered 2024-04-21 (×2): 2 [IU] via SUBCUTANEOUS
  Administered 2024-04-22: 3 [IU] via SUBCUTANEOUS
  Administered 2024-04-22: 2 [IU] via SUBCUTANEOUS
  Administered 2024-04-23: 1 [IU] via SUBCUTANEOUS
  Administered 2024-04-23: 2 [IU] via SUBCUTANEOUS
  Filled 2024-04-20 (×11): qty 1

## 2024-04-20 NOTE — H&P (Signed)
 History and Physical    Lisa Campbell FMW:[id number] DOB: 1928-08-16 DOA: 04/19/2024  Patient coming from: [place] [facility].  Chief Complaint: Chest pain.  HPI: [name] is a 89 y.o. female with history of diabetes mellitus type 2, chronic kidney disease stage III, chronic lower extremity edema was brought to the ER after patient started complaining of chest pain radiating to her left arm.  Denies any associated shortness of breath abdominal pain nausea or vomiting.  ED Course: In the ER patient was found to have acute inferior lateral ST MI with troponins of 461 and second one being more than 24,000 was taken to cardiac cath and had successful primary PCI with DES to OM1 and left circumflex.  Hospitalist was consulted for admission.  On exam at bedside patient is not in distress and at the time of my exam denies any chest pain.  Labs show creatinine 1.2 calcium of 10.4 WBC of 12 hemoglobin of 14.3 platelets of 413.  Review of Systems: As per HPI, rest all negative.   Past Medical History:  Diagnosis Date   Arthritis    Benign essential hypertension 04/12/2018   Cancer (HCC)    skin mohs left ear ? 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 She has never used smokeless tobacco. She reports current alcohol use. She reports that she does not use drugs.  Allergies  Allergen Reactions   Penicillins     ?rxn    Statins     ? Rxn cant tolerate crestor and lipitor noted prev records.     Family History  Problem Relation Age of Onset   Cancer Mother        ?colon    Diabetes Mother    Early death Mother    Heart disease Mother    Hypertension Mother     Hyperlipidemia Mother    Cancer Father        ? MM   Diabetes Sister    Diabetes Brother    Arthritis Brother     Prior to Admission medications   Medication Sig Start Date End Date Taking? Authorizing Provider  acetaminophen  (TYLENOL ) 325 MG tablet Take 650 mg by mouth every 4 (four) hours as needed. for pain and/or increased temp. May be administered orally (via gastric tube if present), or rectally - if patient unable to take orally.    [provider]  Cholecalciferol (VITAMIN D3) 2000 units TABS Take 2,000 Units by mouth daily. 11/08/17   Pucilowska, Jolanta B, MD  citalopram  (CELEXA ) 10 MG tablet Take 1 tablet (10 mg total) by mouth daily. 11/07/17   Pucilowska, Jolanta B, MD  cyanocobalamin  1000 MCG tablet Take 1,000 mcg by mouth daily.    [provider]  ipratropium (ATROVENT) 0.03 % nasal spray Place 2 sprays into both nostrils 2 (two) times daily.    [provider]  Magnesium  Oxide 250 MG TABS Take 1 tablet by mouth daily.    [provider]  torsemide  (DEMADEX ) 10 MG tablet Take 1 tablet (10 mg total) by mouth daily. 01/25/18  McLean-Scocuzza, Randine SAILOR, MD  traZODone  (DESYREL ) 50 MG tablet Take 50 mg by mouth at bedtime as needed.     [provider]  UNABLE TO FIND Diet - Liquids: _X__Regular;    [provider]    Physical Exam: Constitutional: Moderately built and nourished. Vitals:   04/20/24 0130 04/20/24 0200 04/20/24 0230 04/20/24 0300  BP: 132/71 113/64 135/89 (!) 143/79  Pulse: 69 71 76 77  Resp: 17 15 15 19   Temp:      TempSrc:      SpO2: 94% 93% 94% 93%  Weight:      Height:       Eyes: Anicteric no pallor. ENMT: No discharge from the ears eyes nose or mouth. Neck: No mass felt.  No neck rigidity. Respiratory: No rhonchi or crepitations. Cardiovascular: S1-S2 heard. Abdomen: Soft nontender bowel sound present. Musculoskeletal: No edema. Skin: No rash. Neurologic: Alert awake oriented time place and  person.  Moves all extremities. Psychiatric: Appears normal.  Normal affect.   Labs on Admission: I have personally reviewed following labs and imaging studies  CBC: Recent Labs  Lab 04/19/24 1927  WBC 12.0*  NEUTROABS 9.7*  HGB 14.3  HCT 43.5  MCV 99.3  PLT 413*   Basic Metabolic Panel: Recent Labs  Lab 04/19/24 1927  NA 139  K 3.9  CL 98  CO2 27  GLUCOSE 253*  BUN 15  CREATININE 1.23*  CALCIUM 10.4*   GFR: Estimated Creatinine Clearance: 27.8 mL/min (A) (by C-G formula based on SCr of 1.23 mg/dL (H)). Liver Function Tests: Recent Labs  Lab 04/19/24 1927  AST 25  ALT 11  ALKPHOS 92  BILITOT 1.2  PROT 8.1  ALBUMIN 3.9   No results for input(s): LIPASE, AMYLASE in the last 168 hours. No results for input(s): AMMONIA in the last 168 hours. Coagulation Profile: No results for input(s): INR, PROTIME in the last 168 hours. Cardiac Enzymes: No results for input(s): CKTOTAL, CKMB, CKMBINDEX, TROPONINI in the last 168 hours. BNP (last 3 results) No results for input(s): PROBNP in the last 8760 hours. HbA1C: No results for input(s): HGBA1C in the last 72 hours. CBG: No results for input(s): GLUCAP in the last 168 hours. Lipid Profile: No results for input(s): CHOL, HDL, LDLCALC, TRIG, CHOLHDL, LDLDIRECT in the last 72 hours. Thyroid  Function Tests: No results for input(s): TSH, T4TOTAL, FREET4, T3FREE, THYROIDAB in the last 72 hours. Anemia Panel: No results for input(s): VITAMINB12, FOLATE, FERRITIN, TIBC, IRON, RETICCTPCT in the last 72 hours. Urine analysis:    Component Value Date/Time   COLORURINE YELLOW (A) 10/31/2017 1418   APPEARANCEUR CLEAR (A) 10/31/2017 1418   LABSPEC 1.008 10/31/2017 1418   PHURINE 6.0 10/31/2017 1418   GLUCOSEU 50 (A) 10/31/2017 1418   GLUCOSEU NEGATIVE 09/27/2017 1148   HGBUR NEGATIVE 10/31/2017 1418   BILIRUBINUR NEGATIVE 10/31/2017 1418   KETONESUR NEGATIVE  10/31/2017 1418   PROTEINUR 30 (A) 10/31/2017 1418   UROBILINOGEN 0.2 09/27/2017 1148   NITRITE NEGATIVE 10/31/2017 1418   LEUKOCYTESUR NEGATIVE 10/31/2017 1418   Sepsis Labs: @LABRCNTIP (procalcitonin:4,lacticidven:4) ) Recent Results (from the past 240 hours)  MRSA Next Gen by PCR, Nasal     Status: None   Collection Time: 04/19/24 11:31 PM   Specimen: Nasal Mucosa; Nasal Swab  Result Value Ref Range Status   MRSA by PCR Next Gen NOT DETECTED NOT DETECTED Final    Comment: (NOTE) The GeneXpert MRSA Assay (FDA approved for NASAL specimens only), is one component of  a comprehensive MRSA colonization surveillance program. It is not intended to diagnose MRSA infection nor to guide or monitor treatment for MRSA infections. Test performance is not FDA approved in patients less than 44 years old. Performed at [facility], [address], [place], [place] [address]      Radiological Exams on Admission: CARDIAC CATHETERIZATION Result Date: 04/19/2024   1st Mrg lesion is 100% stenosed.   Prox Cx lesion is 95% stenosed.   Mid Cx to Dist Cx lesion is 75% stenosed.   Prox LAD lesion is 75% stenosed.   Mid LAD lesion is 60% stenosed.   Prox RCA lesion is 100% stenosed.   A drug-eluting stent was successfully placed using a STENT ONYX FRONTIER 2.25X12.   A drug-eluting stent was successfully placed using a STENT ONYX FRONTIER 2.5X22.   Post intervention, there is a 0% residual stenosis.   Post intervention, there is a 0% residual stenosis.   There is mild to moderate left ventricular systolic dysfunction.   LV end diastolic pressure is moderately elevated.   The left ventricular ejection fraction is 35-45% by visual estimate. 1.  Inferior lateral STEMI 2.  Three-vessel coronary artery disease with 75% ectatic stenosis proximal LAD, thrombotic 95% stenosis proximal left circumflex, acute occlusion of OM1, chronic occlusion of proximal RCA with left-to-right collaterals 3.  Mild to moderate  reduced left ventricular function with estimate LVEF 35-40% with anterior apical and inferior apical akinesis 4.  Successful primary PCI with 2.25 x 12 mm Onyx frontier DES OM1 and 2.5 x 22 mm Onyx frontier DES proximal left circumflex Recommendations 1.  Dual antiplatelet therapy uninterrupted x 1 year 2.  Start metoprolol  succinate 25 mg daily 3.  Start atorvastatin 80 mg daily 4.  2D echocardiogram 5.  Further recommendations pending 2D echocardiogram results   EKG: Independently reviewed.  Acute inferior infarct.  Assessment/Plan Principal Problem:   Acute ST elevation myocardial infarction (STEMI) of lateral wall (HCC) Active Problems:   Type 2 diabetes mellitus with stage 3 chronic kidney disease (HCC)   GERD (gastroesophageal reflux disease)   CKD (chronic kidney disease) stage 3, GFR 30-59 ml/min (HCC)   Acute ST elevation myocardial infarction (STEMI) (HCC)    Acute inferior lateral STMI status post PCI on dual antiplatelet agents beta-blocker statins.  2D echo.  Further recommendations per cardiology. Diabetes mellitus type 2 need to verify home medications.  Check hemoglobin A1c.  On sliding scale coverage for now. Chronic kidney disease stage III closely monitor metabolic panel. Mild hypercalcemia repeat metabolic panel.  If still elevated may consider further workup.  Home medications needs to be verified.  Need to get further history when family available.  Since patient has acute MI will need close monitoring further workup and more than 2 midnight stay.   DVT prophylaxis: Per cardiology patient already received heparin  infusion. Code Status: DNR confirmed with patient. Family Communication: Discussed with patient. Disposition Plan: ICU. Consults called: Cardiology. Admission status: Inpatient.

## 2024-04-20 NOTE — Progress Notes (Signed)
 Progress Note   Patient: Lisa Campbell FMW:[id number] DOB: May 02, 1928 DOA: 04/19/2024     1 DOS: the patient was seen and examined on 04/20/2024   Brief hospital course: 89 y.o. female with history of diabetes mellitus type 2, chronic kidney disease stage III, chronic lower extremity edema was brought to the ER after patient started complaining of chest pain radiating to her left arm.  Denies any associated shortness of breath abdominal pain nausea or vomiting.   ED Course: In the ER patient was found to have acute inferior lateral ST MI with troponins of 461 and second one being more than 24,000 was taken to cardiac cath and had successful primary PCI with DES to OM1 and left circumflex.  Hospitalist was consulted for admission.  On exam at bedside patient is not in distress and at the time of my exam denies any chest pain.  Labs show creatinine 1.2 calcium of 10.4 WBC of 12 hemoglobin of 14.3 platelets of 413.  8/30.  No further arm pain or pain across the chest.  Admitted with STEMI.  Assessment and Plan: * Acute ST elevation myocardial infarction (STEMI) of inferolateral wall (HCC) Stent placement to DES OM1 and left circumflex.  Patient on aspirin , Plavix , Toprol .  Patient intolerant to statins.  LDL 121.  Consider PCSK9 inhibitor as outpatient.  Transfer out of ICU.  Acute kidney injury superimposed on CKD (HCC) AKI on CKD stage III.  Creatinine 1.24 on presentation down to 0.83  Hypercalcemia Likely secondary to dehydration.  Repeat calcium 9.1  Type 2 diabetes mellitus with stage 3 chronic kidney disease (HCC) With hyperglycemia.  Check hemoglobin A1c.  Patient on sliding scale insulin         Subjective: Patient admitted with left arm pain which went across her chest.  Feels okay now.  Physical Exam: Vitals:   04/20/24 0900 04/20/24 1000 04/20/24 1100 04/20/24 1147  BP: 121/66 134/82 136/67 (!) 141/70  Pulse: 78 80  75  Resp: 17   18  Temp:    97.8 F (36.6 C)   TempSrc:    Oral  SpO2: 96% 96%  97%  Weight:      Height:       Physical Exam HENT:     Head: Normocephalic.  Eyes:     General: Lids are normal.     Conjunctiva/sclera: Conjunctivae normal.  Cardiovascular:     Rate and Rhythm: Normal rate and regular rhythm.     Heart sounds: Normal heart sounds, S1 normal and S2 normal.  Pulmonary:     Breath sounds: No decreased breath sounds, wheezing, rhonchi or rales.  Abdominal:     Palpations: Abdomen is soft.     Tenderness: There is no abdominal tenderness.  Musculoskeletal:     Right lower leg: No swelling.     Left lower leg: No swelling.  Skin:    General: Skin is warm.     Findings: No rash.  Neurological:     Mental Status: She is alert and oriented to person, place, and time.     Data Reviewed: Cardiac cath reviewed Glucose 208, creatinine 0.83, AST 173, troponin greater than 24,000, LDL 121, hemoglobin 11.8, white blood count 13.4, platelet count 363  Family Communication: Spoke with sister on the phone  Disposition: Status is: Inpatient Remains inpatient appropriate because: Transfer out of ICU today.  Watch overnight.  Patient apparently from assisted living will have to check whether we can get back over the weekend.  Planned  Discharge Destination: Home    Time spent: 28 minutes  Author: Charlie Patterson, MD 04/20/2024 12:04 PM  For on call review [email or web address].

## 2024-04-20 NOTE — Assessment & Plan Note (Signed)
 AKI on CKD stage IIIb.  Creatinine 1.24 on presentation down to 0.83.  The patient was hydrated before and after cardiac catheterization.  Creatinine went up to 1.22.  Today's creatinine 1.39.  I will give a fluid bolus prior to discharge, recommend checking bmp upon follow up appointment.

## 2024-04-20 NOTE — Progress Notes (Signed)
  Echocardiogram 2D Echocardiogram has been performed.  Thedora GORMAN Louder 04/20/2024, 9:18 AM

## 2024-04-20 NOTE — Evaluation (Signed)
 Physical Therapy Evaluation Patient Details Name: Genelda Roark MRN: [id number] DOB: [birth year] Today's Date: 04/20/2024  History of Present Illness  Pt is a 89 y/o F admitted on 04/19/24 after presenting with c/o chest pain radiating to LUE. In the ER patient was found to have acute inferior lateral ST MI with troponins of 461 & second one being more than 24,000 was taken to cardiac cath & had successful primary PCI with DES to OM1 & left circumflex. PMH: DM2, CKD 3, chronic LE edema, HLD, HTN, R rotator cuff disorder, shingles  Clinical Impression  Pt seen for PT evaluation with pt agreeable, sitting up in recliner. Pt is AxOx4 but unable to recall name of ALF where she resides. Pt reports she's ambulatory with rollator, denies falls in the past 6 months, & is independent/mod I with ADLs. On this date, PT educated pt on need to limit use of RUE. Pt ambulates around nurses station & back to room with RW & CGA, slightly decreased balance & decreased awareness re: proper use of RW as pt is used to using rollator. Recommend ongoing PT services to progress balance & gait with LRAD.        If plan is discharge home, recommend the following: A little help with walking and/or transfers;A little help with bathing/dressing/bathroom;Assistance with cooking/housework;Assist for transportation   Can travel by private vehicle        Equipment Recommendations None recommended by PT  Recommendations for Other Services       Functional Status Assessment Patient has had a recent decline in their functional status and demonstrates the ability to make significant improvements in function in a reasonable and predictable amount of time.     Precautions / Restrictions Precautions Precautions: Fall Precaution/Restrictions Comments: R wrist cardiac cath 04/19/24 Restrictions Weight Bearing Restrictions Per Provider Order: No      Mobility  Bed Mobility               General bed mobility  comments: not tested, pt received & left sitting in recliner    Transfers Overall transfer level: Needs assistance Equipment used: Rolling walker (2 wheels) Transfers: Sit to/from Stand Sit to Stand: Supervision                Ambulation/Gait Ambulation/Gait assistance: Contact guard assist Gait Distance (Feet): 185 Feet Assistive device: Rolling walker (2 wheels) Gait Pattern/deviations: Decreased step length - right, Decreased step length - left, Decreased stride length Gait velocity: silghtly decreased     General Gait Details: Pushes RW slightly out in front of her, 1 mild LOB to R with min assist to correct, lateral sway.  Stairs            Wheelchair Mobility     Tilt Bed    Modified Rankin (Stroke Patients Only)       Balance Overall balance assessment: Needs assistance Sitting-balance support: Feet supported Sitting balance-Leahy Scale: Good     Standing balance support: During functional activity, Bilateral upper extremity supported, Reliant on assistive device for balance Standing balance-Leahy Scale: Fair                               Pertinent Vitals/Pain Pain Assessment Pain Assessment: No/denies pain    Home Living Family/patient expects to be discharged to:: Assisted living                 Home Equipment: Rollator (4 wheels) Additional Comments:  Pt unable to recall name of ALF.    Prior Function Prior Level of Function : Independent/Modified Independent             Mobility Comments: reports ambulatory with rollator, denies falls in the past 6 months ADLs Comments: reports she does not need help with bathing, dressing     Extremity/Trunk Assessment   Upper Extremity Assessment Upper Extremity Assessment: Overall WFL for tasks assessed (R wrist access site clean, dry, & intact at beginning & end of session)    Lower Extremity Assessment Lower Extremity Assessment: Generalized weakness        Communication   Communication Communication: No apparent difficulties    Cognition Arousal: Alert Behavior During Therapy: WFL for tasks assessed/performed   PT - Cognitive impairments: No family/caregiver present to determine baseline                       PT - Cognition Comments: Unable to recall name of her ALF but AxOx4. Following commands: Intact       Cueing Cueing Techniques: Verbal cues     General Comments General comments (skin integrity, edema, etc.): VSS on room air    Exercises     Assessment/Plan    PT Assessment Patient needs continued PT services  PT Problem List Decreased strength;Decreased activity tolerance;Decreased balance;Decreased mobility;Decreased safety awareness;Decreased knowledge of use of DME       PT Treatment Interventions Balance training;DME instruction;Gait training;Neuromuscular re-education;Functional mobility training;Therapeutic activities;Therapeutic exercise;Patient/family education    PT Goals (Current goals can be found in the Care Plan section)  Acute Rehab PT Goals Patient Stated Goal: none stated PT Goal Formulation: With patient Time For Goal Achievement: 05/04/24 Potential to Achieve Goals: Good    Frequency Min 2X/week     Co-evaluation               AM-PAC PT 6 Clicks Mobility  Outcome Measure Help needed turning from your back to your side while in a flat bed without using bedrails?: None Help needed moving from lying on your back to sitting on the side of a flat bed without using bedrails?: A Little Help needed moving to and from a bed to a chair (including a wheelchair)?: A Little Help needed standing up from a chair using your arms (e.g., wheelchair or bedside chair)?: A Little Help needed to walk in hospital room?: A Little Help needed climbing 3-5 steps with a railing? : A Little 6 Click Score: 19    End of Session   Activity Tolerance: Patient tolerated treatment well;Patient limited  by fatigue Patient left: in chair;with chair alarm set;with call bell/phone within reach Nurse Communication: Mobility status PT Visit Diagnosis: Unsteadiness on feet (R26.81);Other abnormalities of gait and mobility (R26.89);Muscle weakness (generalized) (M62.81)    Time: 8753-8743 PT Time Calculation (min) (ACUTE ONLY): 10 min   Charges:   PT Evaluation $PT Eval Moderate Complexity: 1 Mod   PT General Charges $$ ACUTE PT VISIT: 1 Visit         Richerd Pinal, PT, DPT 04/20/24, 1:04 PM   Richerd CHRISTELLA Pinal 04/20/2024, 1:02 PM

## 2024-04-20 NOTE — Progress Notes (Signed)
 When 3 cc of air removed from band, site bled. Reinflated to 10 cc

## 2024-04-20 NOTE — Progress Notes (Signed)
 SUBJECTIVE: Patient feeling better no chest pain or shortness of breath.   Vitals:   04/20/24 0900 04/20/24 1000 04/20/24 1100 04/20/24 1147  BP: 121/66 134/82 136/67 (!) 141/70  Pulse: 78 80  75  Resp: 17   18  Temp:    97.8 F (36.6 C)  TempSrc:    Oral  SpO2: 96% 96%  97%  Weight:      Height:        Intake/Output Summary (Last 24 hours) at 04/20/2024 1238 Last data filed at 04/19/2024 2351 Gross per 24 hour  Intake 500 ml  Output 500 ml  Net 0 ml    LABS: Basic Metabolic Panel: Recent Labs    04/19/24 1927 04/20/24 0432  NA 139 136  K 3.9 3.9  CL 98 100  CO2 27 26  GLUCOSE 253* 208*  BUN 15 14  CREATININE 1.23* 0.83  CALCIUM 10.4* 9.1   Liver Function Tests: Recent Labs    04/19/24 1927 04/20/24 0432  AST 25 173*  ALT 11 28  ALKPHOS 92 74  BILITOT 1.2 0.9  PROT 8.1 6.4*  ALBUMIN 3.9 3.1*   No results for input(s): LIPASE, AMYLASE in the last 72 hours. CBC: Recent Labs    04/19/24 1927 04/20/24 0432  WBC 12.0* 13.4*  NEUTROABS 9.7* 10.5*  HGB 14.3 11.8*  HCT 43.5 35.5*  MCV 99.3 98.9  PLT 413* 363   Cardiac Enzymes: No results for input(s): CKTOTAL, CKMB, CKMBINDEX, TROPONINI in the last 72 hours. BNP: Invalid input(s): POCBNP D-Dimer: No results for input(s): DDIMER in the last 72 hours. Hemoglobin A1C: No results for input(s): HGBA1C in the last 72 hours. Fasting Lipid Panel: Recent Labs    04/20/24 0431  CHOL 215*  HDL 56  LDLCALC 121*  TRIG 192*  CHOLHDL 3.8   Thyroid  Function Tests: No results for input(s): TSH, T4TOTAL, T3FREE, THYROIDAB in the last 72 hours.  Invalid input(s): FREET3 Anemia Panel: No results for input(s): VITAMINB12, FOLATE, FERRITIN, TIBC, IRON, RETICCTPCT in the last 72 hours.   PHYSICAL EXAM General: Well developed, well nourished, in no acute distress HEENT:  Normocephalic and atramatic Neck:  No JVD.  Lungs: Clear bilaterally to auscultation and  percussion. Heart: HRRR . Normal S1 and S2 without gallops or murmurs.  Abdomen: Bowel sounds are positive, abdomen soft and non-tender  Msk:  Back normal, normal gait. Normal strength and tone for age. Extremities: No clubbing, cyanosis or edema.   Neuro: Alert and oriented X 3. Psych:  Good affect, responds appropriately  TELEMETRY: Sinus rhythm.  ASSESSMENT AND PLAN: #1 inferolateral STEMI status post PCI of OM1 and proximal left circumflex. #2 three-vessel coronary artery disease with 75% stenosis of proximal LAD, chronically occluded proximal RCA, thrombotic 95% stenosis of the proximal left circumflex, acute 100% stenosis of OM1. #3 mild to moderately reduced left ventricular function. #4 essential hypertension and hyperlipidemia and type 2 diabetes with chronic renal disease stage III. Recommendation dual antiplatelet therapy uninterrupted for 1 year including aspirin  and Plavix .  Start metoprolol  25 mg succinate.  Continue statins and consider Repatha.   ICD-10-CM   1. ST elevation myocardial infarction (STEMI), unspecified artery (HCC)  I21.3       Principal Problem:   Acute ST elevation myocardial infarction (STEMI) of inferolateral wall (HCC) Active Problems:   Type 2 diabetes mellitus with stage 3 chronic kidney disease (HCC)   GERD (gastroesophageal reflux disease)   Hypercalcemia   Acute kidney injury superimposed on CKD (HCC)  Denyse Bathe, MD, FACC 04/20/2024 12:38 PM

## 2024-04-20 NOTE — Assessment & Plan Note (Addendum)
 With hyperglycemia.  Hemoglobin A1c 7.7.  Patient on sliding scale insulin .  Will put on Glucotrol  XL tomorrow morning.

## 2024-04-20 NOTE — Plan of Care (Signed)
 Hospital Medicine Transfer Accept Note Patient Name/Age: Lisa Campbell / 89 y.o. MRN: [id number] Admission Date: 04/19/2024  Once successfully transferred to the appropriate floor, TRH will assume care for the patient above.  A/P: 89 year old female referred for inferolateral STEMI.  The patient is a resident of [facility].  She was in her usual state of health until night when she developed sided chest pain.  She presented to [facility] ED where ECG revealed ST elevations in leads II, III and aVF, V 4-V6.  The patient was brought to the cardiac catheterization laboratory where coronary angiography revealed three-vessel coronary artery disease with 75% stenosis proximal LAD, chronically occluded proximal RCA, thrombotic 95% stenosis proximal left circumflex, and occluded OM1 ( likely culprit vessel).  The patient underwent primary PCI receiving 2.25 x 12 mm DES OM1, and 2.5 x 22 mm DES proximal left circumflex.  Left ventriculography revealed mild to moderate reduced left ventricular function with estimated LVEF 35-40%. Will need Cards on arrival.

## 2024-04-20 NOTE — Assessment & Plan Note (Addendum)
 Stent placement to DES OM1 and left circumflex.  Patient on aspirin , Plavix , Toprol .  Patient intolerant to statins.  LDL 121.  Consider PCSK9 inhibitor as outpatient.  Transfer out of ICU.

## 2024-04-20 NOTE — Hospital Course (Signed)
 89 y.o. female with history of diabetes mellitus type 2, chronic kidney disease stage III, chronic lower extremity edema was brought to the ER after patient started complaining of chest pain radiating to her left arm.  Denies any associated shortness of breath abdominal pain nausea or vomiting.   ED Course: In the ER patient was found to have acute inferior lateral ST MI with troponins of 461 and second one being more than 24,000 was taken to cardiac cath and had successful primary PCI with DES to OM1 and left circumflex.  Hospitalist was consulted for admission.  On exam at bedside patient is not in distress and at the time of my exam denies any chest pain.  Labs show creatinine 1.2 calcium of 10.4 WBC of 12 hemoglobin of 14.3 platelets of 413.  8/30.  No further arm pain or pain across the chest.  Admitted with STEMI. 8/31.  Will give a fluid bolus for acute kidney injury.  Cardiology cleared for disposition but unfortunately her facility will not be able to take back till Tuesday. 9/1.  Patient feeling okay.  Her facility will not be able to take back till tomorrow. 9/2. Creatinine 1.39 today.  Will give fluid bolus prior to discharge.  Recommend checking bmp in follow up appointment

## 2024-04-20 NOTE — Assessment & Plan Note (Signed)
 Likely secondary to dehydration.  Repeat calcium 9.4

## 2024-04-20 NOTE — Plan of Care (Signed)

## 2024-04-20 NOTE — Plan of Care (Signed)
  Problem: Activity: Goal: Ability to return to baseline activity level will improve Outcome: Adequate for Discharge

## 2024-04-21 DIAGNOSIS — N179 Acute kidney failure, unspecified: Secondary | ICD-10-CM | POA: Diagnosis not present

## 2024-04-21 DIAGNOSIS — E1122 Type 2 diabetes mellitus with diabetic chronic kidney disease: Secondary | ICD-10-CM | POA: Diagnosis not present

## 2024-04-21 DIAGNOSIS — I2119 ST elevation (STEMI) myocardial infarction involving other coronary artery of inferior wall: Secondary | ICD-10-CM | POA: Diagnosis not present

## 2024-04-21 DIAGNOSIS — N1832 Chronic kidney disease, stage 3b: Secondary | ICD-10-CM

## 2024-04-21 DIAGNOSIS — E669 Obesity, unspecified: Secondary | ICD-10-CM | POA: Insufficient documentation

## 2024-04-21 LAB — BASIC METABOLIC PANEL WITH GFR
Anion gap: 13 (ref 5–15)
BUN: 15 mg/dL (ref 8–23)
CO2: 28 mmol/L (ref 22–32)
Calcium: 9.2 mg/dL (ref 8.9–10.3)
Chloride: 100 mmol/L (ref 98–111)
Creatinine, Ser: 1.22 mg/dL — ABNORMAL HIGH (ref 0.44–1.00)
GFR, Estimated: 41 mL/min — ABNORMAL LOW (ref >60)
Glucose, Bld: 205 mg/dL — ABNORMAL HIGH (ref 70–99)
Potassium: 3.9 mmol/L (ref 3.5–5.1)
Sodium: 141 mmol/L (ref 135–145)

## 2024-04-21 LAB — HEMOGLOBIN A1C
Hgb A1c MFr Bld: 7.7 % — ABNORMAL HIGH (ref 4.8–5.6)
Mean Plasma Glucose: 174 mg/dL

## 2024-04-21 LAB — GLUCOSE, CAPILLARY
Glucose-Capillary: 195 mg/dL — ABNORMAL HIGH (ref 70–99)
Glucose-Capillary: 212 mg/dL — ABNORMAL HIGH (ref 70–99)
Glucose-Capillary: 196 mg/dL — ABNORMAL HIGH (ref 70–99)
Glucose-Capillary: 165 mg/dL — ABNORMAL HIGH (ref 70–99)
Glucose-Capillary: 172 mg/dL — ABNORMAL HIGH (ref 70–99)

## 2024-04-21 LAB — CBC
HCT: 34.3 % — ABNORMAL LOW (ref 36.0–46.0)
Hemoglobin: 11.3 g/dL — ABNORMAL LOW (ref 12.0–15.0)
MCH: 32.8 pg (ref 26.0–34.0)
MCHC: 32.9 g/dL (ref 30.0–36.0)
MCV: 99.4 fL (ref 80.0–100.0)
Platelets: 332 K/uL (ref 150–400)
RBC: 3.45 MIL/uL — ABNORMAL LOW (ref 3.87–5.11)
RDW: 12.9 % (ref 11.5–15.5)
WBC: 11.4 K/uL — ABNORMAL HIGH (ref 4.0–10.5)
nRBC: 0 % (ref 0.0–0.2)

## 2024-04-21 MED ORDER — SODIUM CHLORIDE 0.9 % IV BOLUS
250.0000 mL | Freq: Once | INTRAVENOUS | Status: AC
  Administered 2024-04-21: 250 mL via INTRAVENOUS

## 2024-04-21 MED ORDER — GLIPIZIDE ER 2.5 MG PO TB24
2.5000 mg | ORAL_TABLET | Freq: Every day | ORAL | Status: DC
  Administered 2024-04-22: 2.5 mg via ORAL
  Filled 2024-04-21: qty 1

## 2024-04-21 MED ORDER — VENLAFAXINE HCL ER 37.5 MG PO CP24
37.5000 mg | ORAL_CAPSULE | Freq: Every day | ORAL | Status: DC
  Administered 2024-04-21 – 2024-04-23 (×3): 37.5 mg via ORAL
  Filled 2024-04-21 (×3): qty 1

## 2024-04-21 MED ORDER — VITAMIN B-12 1000 MCG PO TABS
1000.0000 ug | ORAL_TABLET | Freq: Every day | ORAL | Status: DC
  Administered 2024-04-22 – 2024-04-23 (×2): 1000 ug via ORAL
  Filled 2024-04-21 (×2): qty 1

## 2024-04-21 NOTE — Progress Notes (Signed)
 Progress Note   Patient: Lisa Campbell FMW:[id number] DOB: 07-19-1928 DOA: 04/19/2024     2 DOS: the patient was seen and examined on 04/21/2024   Brief hospital course: 89 y.o. female with history of diabetes mellitus type 2, chronic kidney disease stage III, chronic lower extremity edema was brought to the ER after patient started complaining of chest pain radiating to her left arm.  Denies any associated shortness of breath abdominal pain nausea or vomiting.   ED Course: In the ER patient was found to have acute inferior lateral ST MI with troponins of 461 and second one being more than 24,000 was taken to cardiac cath and had successful primary PCI with DES to OM1 and left circumflex.  Hospitalist was consulted for admission.  On exam at bedside patient is not in distress and at the time of my exam denies any chest pain.  Labs show creatinine 1.2 calcium of 10.4 WBC of 12 hemoglobin of 14.3 platelets of 413.  8/30.  No further arm pain or pain across the chest.  Admitted with STEMI. 8/31.  Will give a fluid bolus for acute kidney injury.  Cardiology cleared for disposition but unfortunately her facility will not be able to take back till Tuesday.  Assessment and Plan: * Acute ST elevation myocardial infarction (STEMI) of inferolateral wall (HCC) Stent placement to DES OM1 and left circumflex.  Patient on aspirin , Plavix , Toprol .  Patient intolerant to statins.  LDL 121.  Consider PCSK9 inhibitor as outpatient.  Cardiology okay with discharge.  Acute kidney injury superimposed on CKD (HCC) AKI on CKD stage IIIb.  Creatinine 1.24 on presentation down to 0.83.  Creatinine went up to 1.22.  Will give a fluid bolus.  Type 2 diabetes mellitus with stage 3 chronic kidney disease (HCC) With hyperglycemia.  Hemoglobin A1c 7.7.  Patient on sliding scale insulin .  Will put on Glucotrol  XL tomorrow morning.  Hypercalcemia Likely secondary to dehydration.  Repeat calcium 9.2  Obesity (BMI  30-39.9) BMI 35.68        Subjective: Patient feels okay.  Offers no complaints.  No complaints of chest pain or arm pain.  Admitted with STEMI.  Physical Exam: Vitals:   04/21/24 0100 04/21/24 0440 04/21/24 0745 04/21/24 1111  BP: 133/67 136/70 (!) 142/71 132/62  Pulse: 84 83 82 70  Resp: 17 17 16 16   Temp: 97.9 F (36.6 C) 98 F (36.7 C) 97.9 F (36.6 C) 98.1 F (36.7 C)  TempSrc: Oral Oral Oral Oral  SpO2: 99% 92% 95% 96%  Weight:      Height:       Physical Exam HENT:     Head: Normocephalic.  Eyes:     General: Lids are normal.     Conjunctiva/sclera: Conjunctivae normal.  Cardiovascular:     Rate and Rhythm: Normal rate and regular rhythm.     Heart sounds: Normal heart sounds, S1 normal and S2 normal.  Pulmonary:     Breath sounds: No decreased breath sounds, wheezing, rhonchi or rales.  Abdominal:     Palpations: Abdomen is soft.     Tenderness: There is no abdominal tenderness.  Musculoskeletal:     Right lower leg: No swelling.     Left lower leg: No swelling.  Skin:    General: Skin is warm.     Findings: No rash.  Neurological:     Mental Status: She is alert and oriented to person, place, and time.     Data Reviewed: Cr  1.22, white blood cell count 11.4, hemoglobin 11.3, platelet count 332  Family Communication: Spoke with patient's sister on the phone  Disposition: Status is: Inpatient With creatinine a little higher today I will give IV fluid bolus.  Unfortunately her facility will not be able to take back on the holiday weekend  Planned Discharge Destination: Assisted living    Time spent: 28 minutes  Author: Charlie Patterson, MD 04/21/2024 1:06 PM  For on call review [email or web address].

## 2024-04-21 NOTE — Progress Notes (Signed)
 SUBJECTIVE: Patient denies any chest pain or shortness of breath.  Patient been walking around in the room.   Vitals:   04/20/24 2054 04/21/24 0100 04/21/24 0440 04/21/24 0745  BP: (!) 145/69 133/67 136/70 (!) 142/71  Pulse: 84 84 83 82  Resp: 18 17 17 16   Temp: (!) 97.4 F (36.3 C) 97.9 F (36.6 C) 98 F (36.7 C) 97.9 F (36.6 C)  TempSrc:  Oral Oral Oral  SpO2: 96% 99% 92% 95%  Weight:      Height:        Intake/Output Summary (Last 24 hours) at 04/21/2024 1041 Last data filed at 04/21/2024 0819 Gross per 24 hour  Intake 250 ml  Output --  Net 250 ml    LABS: Basic Metabolic Panel: Recent Labs    04/20/24 0432 04/21/24 0735  NA 136 141  K 3.9 3.9  CL 100 100  CO2 26 28  GLUCOSE 208* 205*  BUN 14 15  CREATININE 0.83 1.22*  CALCIUM 9.1 9.2   Liver Function Tests: Recent Labs    04/19/24 1927 04/20/24 0432  AST 25 173*  ALT 11 28  ALKPHOS 92 74  BILITOT 1.2 0.9  PROT 8.1 6.4*  ALBUMIN 3.9 3.1*   No results for input(s): LIPASE, AMYLASE in the last 72 hours. CBC: Recent Labs    04/19/24 1927 04/20/24 0432 04/21/24 0735  WBC 12.0* 13.4* 11.4*  NEUTROABS 9.7* 10.5*  --   HGB 14.3 11.8* 11.3*  HCT 43.5 35.5* 34.3*  MCV 99.3 98.9 99.4  PLT 413* 363 332   Cardiac Enzymes: No results for input(s): CKTOTAL, CKMB, CKMBINDEX, TROPONINI in the last 72 hours. BNP: Invalid input(s): POCBNP D-Dimer: No results for input(s): DDIMER in the last 72 hours. Hemoglobin A1C: Recent Labs    04/20/24 0432  HGBA1C 7.7*   Fasting Lipid Panel: Recent Labs    04/20/24 0431  CHOL 215*  HDL 56  LDLCALC 121*  TRIG 192*  CHOLHDL 3.8   Thyroid  Function Tests: No results for input(s): TSH, T4TOTAL, T3FREE, THYROIDAB in the last 72 hours.  Invalid input(s): FREET3 Anemia Panel: No results for input(s): VITAMINB12, FOLATE, FERRITIN, TIBC, IRON, RETICCTPCT in the last 72 hours.   PHYSICAL EXAM General: Well developed,  well nourished, in no acute distress HEENT:  Normocephalic and atramatic Neck:  No JVD.  Lungs: Clear bilaterally to auscultation and percussion. Heart: HRRR . Normal S1 and S2 without gallops or murmurs.  Abdomen: Bowel sounds are positive, abdomen soft and non-tender  Msk:  Back normal, normal gait. Normal strength and tone for age. Extremities: No clubbing, cyanosis or edema.   Neuro: Alert and oriented X 3. Psych:  Good affect, responds appropriately  TELEMETRY: Normal sinus rhythm  ASSESSMENT AND PLAN: #1 status post STEMI with inferolateral myocardial infarction.  Patient had PCI and stenting of the proximal left circumflex and OM1.  Patient is stable right now with no chest pain.  Advise discharging the patient with follow-up with Dr. Larey show 1 to 2 weeks and [facility] cardiology. #2 GERD and diabetes.  Defer to hospitalist for management.   ICD-10-CM   1. ST elevation myocardial infarction (STEMI), unspecified artery (HCC)  I21.3       Principal Problem:   Acute ST elevation myocardial infarction (STEMI) of inferolateral wall (HCC) Active Problems:   Type 2 diabetes mellitus with stage 3 chronic kidney disease (HCC)   GERD (gastroesophageal reflux disease)   Hypercalcemia   Acute kidney injury superimposed on CKD (  HCC)    Denyse Bathe, MD, FACC 04/21/2024 10:41 AM

## 2024-04-21 NOTE — Assessment & Plan Note (Signed)
 BMI 35.68

## 2024-04-21 NOTE — Plan of Care (Signed)

## 2024-04-22 DIAGNOSIS — N179 Acute kidney failure, unspecified: Secondary | ICD-10-CM | POA: Diagnosis not present

## 2024-04-22 DIAGNOSIS — E1122 Type 2 diabetes mellitus with diabetic chronic kidney disease: Secondary | ICD-10-CM | POA: Diagnosis not present

## 2024-04-22 DIAGNOSIS — I2119 ST elevation (STEMI) myocardial infarction involving other coronary artery of inferior wall: Secondary | ICD-10-CM | POA: Diagnosis not present

## 2024-04-22 LAB — BASIC METABOLIC PANEL WITH GFR
Anion gap: 17 — ABNORMAL HIGH (ref 5–15)
BUN: 17 mg/dL (ref 8–23)
CO2: 23 mmol/L (ref 22–32)
Calcium: 9.4 mg/dL (ref 8.9–10.3)
Chloride: 99 mmol/L (ref 98–111)
Creatinine, Ser: 1.24 mg/dL — ABNORMAL HIGH (ref 0.44–1.00)
GFR, Estimated: 40 mL/min — ABNORMAL LOW (ref >60)
Glucose, Bld: 198 mg/dL — ABNORMAL HIGH (ref 70–99)
Potassium: 3.8 mmol/L (ref 3.5–5.1)
Sodium: 139 mmol/L (ref 135–145)

## 2024-04-22 LAB — GLUCOSE, CAPILLARY
Glucose-Capillary: 195 mg/dL — ABNORMAL HIGH (ref 70–99)
Glucose-Capillary: 225 mg/dL — ABNORMAL HIGH (ref 70–99)
Glucose-Capillary: 120 mg/dL — ABNORMAL HIGH (ref 70–99)
Glucose-Capillary: 153 mg/dL — ABNORMAL HIGH (ref 70–99)

## 2024-04-22 LAB — POCT ACTIVATED CLOTTING TIME: Activated Clotting Time: 291 s

## 2024-04-22 MED ORDER — GLIPIZIDE ER 5 MG PO TB24
5.0000 mg | ORAL_TABLET | Freq: Every day | ORAL | Status: DC
  Administered 2024-04-23: 5 mg via ORAL
  Filled 2024-04-22: qty 1

## 2024-04-22 NOTE — TOC Progression Note (Addendum)
 Transition of Care [facility]) - Progression Note    Patient Details  Name: Lisa Campbell MRN: [id number] Date of Birth: 1928-01-29  [facility] of [facility]) CM/SW Contact  Lorraine LILLETTE Fenton, LCSW Phone Number: 04/22/2024, 8:40 AM  Clinical Narrative:    Pt close to DC ready to [facility] [place], Multiple attempts made to reach facility and weekend contact to coordinate return.  No response - [place] to MD advising. ICM following.     Addendum: CSW visited pt, she was standing speaking to the RN.  Offered choice for recommended PT at DC, Pt declined and states she does no think that she needs the service.                   Expected Discharge Plan and Services                                               Social Drivers of Health (SDOH) Interventions SDOH Screenings   Alcohol Screen: Low Risk  (11/05/2017)  Tobacco Use: Low Risk  (04/20/2024)    Readmission Risk Interventions     No data to display

## 2024-04-22 NOTE — Care Management Important Message (Signed)
 Important Message  Patient Details  Name: Lisa Campbell MRN: [id number] Date of Birth: Jan 03, 1928   Important Message Given:  Yes - Medicare IM     Lisa Campbell 04/22/2024, 1:52 PM

## 2024-04-22 NOTE — Progress Notes (Signed)
 Progress Note   Patient: Lisa Campbell FMW:[id number] DOB: 02/06/1928 DOA: 04/19/2024     3 DOS: the patient was seen and examined on 04/22/2024   Brief hospital course: 89 y.o. female with history of diabetes mellitus type 2, chronic kidney disease stage III, chronic lower extremity edema was brought to the ER after patient started complaining of chest pain radiating to her left arm.  Denies any associated shortness of breath abdominal pain nausea or vomiting.   ED Course: In the ER patient was found to have acute inferior lateral ST MI with troponins of 461 and second one being more than 24,000 was taken to cardiac cath and had successful primary PCI with DES to OM1 and left circumflex.  Hospitalist was consulted for admission.  On exam at bedside patient is not in distress and at the time of my exam denies any chest pain.  Labs show creatinine 1.2 calcium of 10.4 WBC of 12 hemoglobin of 14.3 platelets of 413.  8/30.  No further arm pain or pain across the chest.  Admitted with STEMI. 8/31.  Will give a fluid bolus for acute kidney injury.  Cardiology cleared for disposition but unfortunately her facility will not be able to take back till Tuesday. 9/1.  Patient feeling okay.  Her facility will not be able to take back till tomorrow.  Assessment and Plan: * Acute ST elevation myocardial infarction (STEMI) of inferolateral wall (HCC) Stent placement to DES OM1 and left circumflex.  Patient on aspirin , Plavix , Toprol .  Patient intolerant to statins.  LDL 121.  Consider PCSK9 inhibitor as outpatient.  Cardiology okay with discharge.  Acute kidney injury superimposed on CKD (HCC) AKI on CKD stage IIIb.  Creatinine 1.24 on presentation down to 0.83.  The patient was hydrated before and after cardiac catheterization.  Creatinine went up to 1.22.  Today's creatinine 1.24.  I think her baseline creatinine is around this level.  Anion gap slightly high today will check lactic acid tomorrow  morning.  Type 2 diabetes mellitus with stage 3 chronic kidney disease (HCC) With hyperglycemia.  Hemoglobin A1c 7.7.  Patient on sliding scale insulin .  On Glucotrol  XL.  Hypercalcemia Likely secondary to dehydration.  Repeat calcium 9.4  Obesity (BMI 30-39.9) BMI 35.68        Subjective: Patient feels okay.  No chest pain shortness of breath or arm pain.  Admitted with STEMI.  Physical Exam: Vitals:   04/21/24 2323 04/22/24 0337 04/22/24 0757 04/22/24 1129  BP: (!) 144/74 (!) 147/77 128/74 119/62  Pulse: 81 79 78 73  Resp: (!) 22 20 16 19   Temp: 98.4 F (36.9 C) 98.1 F (36.7 C) 97.6 F (36.4 C) 98.2 F (36.8 C)  TempSrc: Oral Oral Oral Oral  SpO2: 97% 96% 96% 98%  Weight:      Height:       Physical Exam HENT:     Head: Normocephalic.  Eyes:     General: Lids are normal.     Conjunctiva/sclera: Conjunctivae normal.  Cardiovascular:     Rate and Rhythm: Normal rate and regular rhythm.     Heart sounds: Normal heart sounds, S1 normal and S2 normal.  Pulmonary:     Breath sounds: No decreased breath sounds, wheezing, rhonchi or rales.  Abdominal:     Palpations: Abdomen is soft.     Tenderness: There is no abdominal tenderness.  Musculoskeletal:     Right lower leg: No swelling.     Left lower leg: No  swelling.  Skin:    General: Skin is warm.     Findings: No rash.  Neurological:     Mental Status: She is alert and oriented to person, place, and time.     Data Reviewed: Anion gap 17, creatinine 1.24, GFR 40  Family Communication: Sister yesterday  Disposition: Status is: Inpatient Remains inpatient appropriate because: Her facility will not be able to take her back till Tuesday  Planned Discharge Destination: Assisted living    Time spent: 27 minutes  Author: Charlie Patterson, MD 04/22/2024 12:31 PM  For on call review [email or web address].

## 2024-04-22 NOTE — Progress Notes (Signed)
 SUBJECTIVE: Patient is feeling much better denies any chest pain or shortness of breath Vitals:   04/21/24 2323 04/22/24 0337 04/22/24 0757 04/22/24 1129  BP: (!) 144/74 (!) 147/77 128/74 119/62  Pulse: 81 79 78 73  Resp: (!) 22 20 16 19   Temp: 98.4 F (36.9 C) 98.1 F (36.7 C) 97.6 F (36.4 C) 98.2 F (36.8 C)  TempSrc: Oral Oral Oral Oral  SpO2: 97% 96% 96% 98%  Weight:      Height:        Intake/Output Summary (Last 24 hours) at 04/22/2024 1201 Last data filed at 04/22/2024 1009 Gross per 24 hour  Intake 840 ml  Output --  Net 840 ml    LABS: Basic Metabolic Panel: Recent Labs    04/21/24 0735 04/22/24 0320  NA 141 139  K 3.9 3.8  CL 100 99  CO2 28 23  GLUCOSE 205* 198*  BUN 15 17  CREATININE 1.22* 1.24*  CALCIUM 9.2 9.4   Liver Function Tests: Recent Labs    04/19/24 1927 04/20/24 0432  AST 25 173*  ALT 11 28  ALKPHOS 92 74  BILITOT 1.2 0.9  PROT 8.1 6.4*  ALBUMIN 3.9 3.1*   No results for input(s): LIPASE, AMYLASE in the last 72 hours. CBC: Recent Labs    04/19/24 1927 04/20/24 0432 04/21/24 0735  WBC 12.0* 13.4* 11.4*  NEUTROABS 9.7* 10.5*  --   HGB 14.3 11.8* 11.3*  HCT 43.5 35.5* 34.3*  MCV 99.3 98.9 99.4  PLT 413* 363 332   Cardiac Enzymes: No results for input(s): CKTOTAL, CKMB, CKMBINDEX, TROPONINI in the last 72 hours. BNP: Invalid input(s): POCBNP D-Dimer: No results for input(s): DDIMER in the last 72 hours. Hemoglobin A1C: Recent Labs    04/20/24 0432  HGBA1C 7.7*   Fasting Lipid Panel: Recent Labs    04/20/24 0431  CHOL 215*  HDL 56  LDLCALC 121*  TRIG 192*  CHOLHDL 3.8   Thyroid  Function Tests: No results for input(s): TSH, T4TOTAL, T3FREE, THYROIDAB in the last 72 hours.  Invalid input(s): FREET3 Anemia Panel: No results for input(s): VITAMINB12, FOLATE, FERRITIN, TIBC, IRON, RETICCTPCT in the last 72 hours.   PHYSICAL EXAM General: Well developed, well nourished, in no  acute distress HEENT:  Normocephalic and atramatic Neck:  No JVD.  Lungs: Clear bilaterally to auscultation and percussion. Heart: HRRR . Normal S1 and S2 without gallops or murmurs.  Abdomen: Bowel sounds are positive, abdomen soft and non-tender  Msk:  Back normal, normal gait. Normal strength and tone for age. Extremities: No clubbing, cyanosis or edema.   Neuro: Alert and oriented X 3. Psych:  Good affect, responds appropriately  TELEMETRY: Sinus rhythm  ASSESSMENT AND PLAN: #1 status post STEMI with inferolateral myocardial infarction.  Patient had PCI and stenting of the proximal left circumflex and OM1.  Patient is stable right now with no chest pain.  Advise discharging the patient with follow-up with Dr. Larey show 1 to 2 weeks and [facility] cardiology. #2 GERD and diabetes.  Defer to hospitalist for management.     ICD-10-CM       ICD-10-CM   1. ST elevation myocardial infarction (STEMI), unspecified artery (HCC)  I21.3       Principal Problem:   Acute ST elevation myocardial infarction (STEMI) of inferolateral wall (HCC) Active Problems:   Type 2 diabetes mellitus with stage 3 chronic kidney disease (HCC)   GERD (gastroesophageal reflux disease)   Hypercalcemia   Acute kidney injury superimposed  on CKD (HCC)   Obesity (BMI 30-39.9)    Denyse Bathe, MD, FACC 04/22/2024 12:01 PM

## 2024-04-23 ENCOUNTER — Encounter: Payer: Self-pay | Admitting: Cardiology

## 2024-04-23 DIAGNOSIS — E669 Obesity, unspecified: Secondary | ICD-10-CM | POA: Diagnosis not present

## 2024-04-23 DIAGNOSIS — I2119 ST elevation (STEMI) myocardial infarction involving other coronary artery of inferior wall: Secondary | ICD-10-CM | POA: Diagnosis not present

## 2024-04-23 DIAGNOSIS — N179 Acute kidney failure, unspecified: Secondary | ICD-10-CM | POA: Diagnosis not present

## 2024-04-23 LAB — BASIC METABOLIC PANEL WITH GFR
Anion gap: 15 (ref 5–15)
BUN: 25 mg/dL — ABNORMAL HIGH (ref 8–23)
CO2: 26 mmol/L (ref 22–32)
Calcium: 9 mg/dL (ref 8.9–10.3)
Chloride: 99 mmol/L (ref 98–111)
Creatinine, Ser: 1.39 mg/dL — ABNORMAL HIGH (ref 0.44–1.00)
GFR, Estimated: 35 mL/min — ABNORMAL LOW (ref >60)
Glucose, Bld: 203 mg/dL — ABNORMAL HIGH (ref 70–99)
Potassium: 3.9 mmol/L (ref 3.5–5.1)
Sodium: 140 mmol/L (ref 135–145)

## 2024-04-23 LAB — LIPOPROTEIN A (LPA): Lipoprotein (a): 40.5 nmol/L — ABNORMAL HIGH (ref <75.0)

## 2024-04-23 LAB — GLUCOSE, CAPILLARY
Glucose-Capillary: 169 mg/dL — ABNORMAL HIGH (ref 70–99)
Glucose-Capillary: 134 mg/dL — ABNORMAL HIGH (ref 70–99)

## 2024-04-23 LAB — LACTIC ACID, PLASMA: Lactic Acid, Venous: 0.9 mmol/L (ref 0.5–1.9)

## 2024-04-23 MED ORDER — LOSARTAN POTASSIUM 25 MG PO TABS
12.5000 mg | ORAL_TABLET | Freq: Every day | ORAL | Status: DC
  Administered 2024-04-23: 12.5 mg via ORAL
  Filled 2024-04-23: qty 1

## 2024-04-23 MED ORDER — ASPIRIN 81 MG PO CHEW: 81.0000 mg | CHEWABLE_TABLET | Freq: Every day | ORAL | 0 refills | Status: AC

## 2024-04-23 MED ORDER — METOPROLOL SUCCINATE ER 25 MG PO TB24: 25.0000 mg | ORAL_TABLET | Freq: Every day | ORAL | 0 refills | Status: AC

## 2024-04-23 MED ORDER — SODIUM CHLORIDE 0.9 % IV BOLUS
250.0000 mL | Freq: Once | INTRAVENOUS | Status: AC
  Administered 2024-04-23: 250 mL via INTRAVENOUS

## 2024-04-23 MED ORDER — CLOPIDOGREL BISULFATE 75 MG PO TABS: 75.0000 mg | ORAL_TABLET | Freq: Every day | ORAL | 0 refills | Status: AC

## 2024-04-23 MED ORDER — LOSARTAN POTASSIUM 25 MG PO TABS: 12.5000 mg | ORAL_TABLET | Freq: Every day | ORAL | 0 refills | Status: AC

## 2024-04-23 NOTE — TOC Transition Note (Addendum)
 Transition of Care [facility]) - Discharge Note   Patient Details  Name: Lisa Campbell MRN: [id number] Date of Birth: 04-28-1928  Transition of [facility]) CM/SW Contact:  Dalia GORMAN Fuse, RN Phone Number: 04/23/2024, 11:31 AM   Clinical Narrative:     8:44 AM TOC received message from hospital MD advising the patient is medically clear to discharge back to her ALF and that her sister will transport. TOC received a call from Luke Oman at [facility] advising the patient can return to the facility with an updated FL2 and DC Summary. TOC completed and faxed FL2 and DC summary to [phone]. TOC did not have an opportunity to speak with the patient or family or complete chart review.  Nurse to call report to [place] [phone]. RM 232  Final next level of care: Other (comment) Rubbie)     Patient Goals and CMS Choice            Discharge Placement                       Discharge Plan and Services Additional resources added to the After Visit Summary for                                       Social Drivers of Health (SDOH) Interventions SDOH Screenings   Alcohol Screen: Low Risk  (11/05/2017)  Tobacco Use: Low Risk  (04/20/2024)     Readmission Risk Interventions     No data to display

## 2024-04-23 NOTE — Plan of Care (Signed)

## 2024-04-23 NOTE — Discharge Summary (Signed)
 Physician Discharge Summary   Patient: Lisa Campbell MRN: [id number] DOB: [birth year]  Admit date:     04/19/2024  Discharge date: 04/23/24  Discharge Physician: Charlie Patterson   PCP: Lenon Layman ORN, MD   Recommendations at discharge:    Follow up PCP 5 days Follow up cardiology one week Cardiac rehab  Discharge Diagnoses: Principal Problem:   Acute ST elevation myocardial infarction (STEMI) of inferolateral wall (HCC) Active Problems:   Acute kidney injury superimposed on CKD (HCC)   Type 2 diabetes mellitus with stage 3 chronic kidney disease (HCC)   Hypercalcemia   GERD (gastroesophageal reflux disease)   Obesity (BMI 30-39.9)   Hospital Course: 89 y.o. female with history of diabetes mellitus type 2, chronic kidney disease stage III, chronic lower extremity edema was brought to the ER after patient started complaining of chest pain radiating to her left arm.  Denies any associated shortness of breath abdominal pain nausea or vomiting.   ED Course: In the ER patient was found to have acute inferior lateral ST MI with troponins of 461 and second one being more than 24,000 was taken to cardiac cath and had successful primary PCI with DES to OM1 and left circumflex.  Hospitalist was consulted for admission.  On exam at bedside patient is not in distress and at the time of my exam denies any chest pain.  Labs show creatinine 1.2 calcium of 10.4 WBC of 12 hemoglobin of 14.3 platelets of 413.  8/30.  No further arm pain or pain across the chest.  Admitted with STEMI. 8/31.  Will give a fluid bolus for acute kidney injury.  Cardiology cleared for disposition but unfortunately her facility will not be able to take back till Tuesday. 9/1.  Patient feeling okay.  Her facility will not be able to take back till tomorrow. 9/2. Creatinine 1.39 today.  Will give fluid bolus prior to discharge.  Recommend checking bmp in follow up appointment  Assessment and Plan: * Acute ST  elevation myocardial infarction (STEMI) of inferolateral wall (HCC) Stent placement to DES OM1 and left circumflex.  Patient on aspirin , Plavix , Toprol .  Patient intolerant to statins.  LDL 121.  Consider PCSK9 inhibitor as outpatient.  Cardiology okay with discharge. EF normal.  Acute kidney injury superimposed on CKD (HCC) AKI on CKD stage IIIb.  Creatinine 1.24 on presentation down to 0.83.  The patient was hydrated before and after cardiac catheterization.  Creatinine went up to 1.22.  Today's creatinine 1.39.  I will give a fluid bolus prior to discharge, recommend checking bmp upon follow up appointment.  Type 2 diabetes mellitus with stage 3 chronic kidney disease (HCC) With hyperglycemia here.  Hemoglobin A1c 7.7.  Patient on sliding scale insulin .  On Glucotrol  XL.  Hold glucophage until follow up bmp.  Hypercalcemia Likely secondary to dehydration.  Repeat calcium 9.0  Obesity (BMI 30-39.9) BMI 35.68         Consultants: cardiology Procedures performed: cardiac cath and stents Disposition: Home health Diet recommendation:  Cardiac and Carb modified diet DISCHARGE MEDICATION: Allergies as of 04/23/2024       Reactions   Penicillins    ?rxn    Statins    ? Rxn cant tolerate crestor and lipitor noted prev records.         Medication List     STOP taking these medications    metFORMIN 500 MG 24 hr tablet Commonly known as: GLUCOPHAGE-XR   torsemide  10 MG tablet Commonly known as:  DEMADEX    UNABLE TO FIND       TAKE these medications    acetaminophen  325 MG tablet Commonly known as: TYLENOL  Take 650 mg by mouth every 4 (four) hours as needed. for pain and/or increased temp. May be administered orally (via gastric tube if present), or rectally - if patient unable to take orally.   aspirin  81 MG chewable tablet Chew 1 tablet (81 mg total) by mouth daily.   clopidogrel  75 MG tablet Commonly known as: PLAVIX  Take 1 tablet (75 mg total) by mouth daily  with breakfast.   cyanocobalamin  1000 MCG tablet Take 1,000 mcg by mouth daily.   glipiZIDE  5 MG 24 hr tablet Commonly known as: GLUCOTROL  XL Take 5 mg by mouth daily.   losartan  25 MG tablet Commonly known as: COZAAR  Take 0.5 tablets (12.5 mg total) by mouth daily.   Magnesium  Oxide 250 MG Tabs Take 1 tablet by mouth daily.   metoprolol  succinate 25 MG 24 hr tablet Commonly known as: TOPROL -XL Take 1 tablet (25 mg total) by mouth daily.   traZODone  50 MG tablet Commonly known as: DESYREL  Take 50 mg by mouth at bedtime as needed.   venlafaxine  XR 37.5 MG 24 hr capsule Commonly known as: EFFEXOR -XR Take 37.5 mg by mouth daily.   Vitamin D3 50 MCG (2000 UT) Tabs Take 2,000 Units by mouth daily.        Follow-up Information     Paraschos, Alexander, MD. Go in 1 week(s).   Specialty: Cardiology Contact information: [address] [facility] [place] [address] [phone]         Lenon Layman ORN, MD Follow up in 5 day(s).   Specialty: Internal Medicine Contact information: [address] [facility] [place] [address] [phone]                Discharge Exam: Fredricka Weights   04/19/24 1922 04/19/24 2356  Weight: 85.9 kg 85.7 kg   Physical Exam HENT:     Head: Normocephalic.  Eyes:     General: Lids are normal.     Conjunctiva/sclera: Conjunctivae normal.  Cardiovascular:     Rate and Rhythm: Normal rate and regular rhythm.     Heart sounds: Normal heart sounds, S1 normal and S2 normal.  Pulmonary:     Breath sounds: No decreased breath sounds, wheezing, rhonchi or rales.  Abdominal:     Palpations: Abdomen is soft.     Tenderness: There is no abdominal tenderness.  Musculoskeletal:     Right lower leg: No swelling.     Left lower leg: No swelling.  Skin:    General: Skin is warm.     Findings: No rash.  Neurological:     Mental Status: She is alert and oriented to person,  place, and time.      Condition at discharge: stable  The results of significant diagnostics from this hospitalization (including imaging, microbiology, ancillary and laboratory) are listed below for reference.   Imaging Studies: ECHOCARDIOGRAM COMPLETE Result Date: 04/20/2024    ECHOCARDIOGRAM REPORT   Patient Name:   ROLANDO WHITBY Date of Exam: 04/20/2024 Medical Rec #:  [id number]           Height:       61.0 in Accession #:    [id number]          Weight:       188.8 lb Date of Birth:  [birth year]  BSA:          1.843 m Patient Age:    89 years            BP:           123/64 mmHg Patient Gender: F                   HR:           80 bpm. Exam Location:  [facility] Procedure: 2D Echo, Cardiac Doppler, Color Doppler and Strain Analysis (Both            Spectral and Color Flow Doppler were utilized during procedure). Indications:     Acute myocardial infarction, unspecified I21.9  History:         Patient has no prior history of Echocardiogram examinations.  Sonographer:     Thedora Louder RDCS, FASE Referring Phys:  [phone] MARSA PARASCHOS Diagnosing Phys: Denyse Bathe  Sonographer Comments: Global longitudinal strain was attempted. IMPRESSIONS  1. Left ventricular ejection fraction, by estimation, is 55 to 60%. The left ventricle has normal function. The left ventricle has no regional wall motion abnormalities. Left ventricular diastolic parameters are consistent with Grade I diastolic dysfunction (impaired relaxation). The global longitudinal strain is normal.  2. Right ventricular systolic function is normal. The right ventricular size is normal.  3. Left atrial size was mildly dilated.  4. The mitral valve is normal in structure. Mild mitral valve regurgitation. No evidence of mitral stenosis.  5. The aortic valve is normal in structure. Aortic valve regurgitation is not visualized. Aortic valve sclerosis is present, with no evidence of aortic valve stenosis.  6. The inferior vena cava is  normal in size with greater than 50% respiratory variability, suggesting right atrial pressure of 3 mmHg. FINDINGS  Left Ventricle: Left ventricular ejection fraction, by estimation, is 55 to 60%. The left ventricle has normal function. The left ventricle has no regional wall motion abnormalities. Strain was performed and the global longitudinal strain is normal. The  left ventricular internal cavity size was normal in size. There is no left ventricular hypertrophy. Left ventricular diastolic parameters are consistent with Grade I diastolic dysfunction (impaired relaxation). Right Ventricle: The right ventricular size is normal. No increase in right ventricular wall thickness. Right ventricular systolic function is normal. Left Atrium: Left atrial size was mildly dilated. Right Atrium: Right atrial size was normal in size. Pericardium: There is no evidence of pericardial effusion. Mitral Valve: The mitral valve is normal in structure. Mild mitral valve regurgitation. No evidence of mitral valve stenosis. Tricuspid Valve: The tricuspid valve is normal in structure. Tricuspid valve regurgitation is trivial. No evidence of tricuspid stenosis. Aortic Valve: The aortic valve is normal in structure. Aortic valve regurgitation is not visualized. Aortic valve sclerosis is present, with no evidence of aortic valve stenosis. Aortic valve peak gradient measures 10.1 mmHg. Pulmonic Valve: The pulmonic valve was normal in structure. Pulmonic valve regurgitation is not visualized. No evidence of pulmonic stenosis. Aorta: The aortic root is normal in size and structure. Venous: The inferior vena cava is normal in size with greater than 50% respiratory variability, suggesting right atrial pressure of 3 mmHg. IAS/Shunts: No atrial level shunt detected by color flow Doppler. Additional Comments: 3D was performed not requiring image post processing on an independent workstation and was indeterminate.  LEFT VENTRICLE PLAX 2D LVIDd:          4.20 cm   Diastology LVIDs:  3.00 cm   LV e' medial:    5.33 cm/s LV PW:         1.20 cm   LV E/e' medial:  10.9 LV IVS:        1.20 cm   LV e' lateral:   3.70 cm/s LVOT diam:     1.80 cm   LV E/e' lateral: 15.7 LV SV:         47 LV SV Index:   26 LVOT Area:     2.54 cm  RIGHT VENTRICLE RV Basal diam:  3.20 cm RV S prime:     11.10 cm/s TAPSE (M-mode): 1.6 cm LEFT ATRIUM             Index        RIGHT ATRIUM          Index LA diam:        2.70 cm 1.46 cm/m   RA Area:     7.55 cm LA Vol (A2C):   19.1 ml 10.36 ml/m  RA Volume:   14.30 ml 7.76 ml/m LA Vol (A4C):   19.4 ml 10.52 ml/m LA Biplane Vol: 19.4 ml 10.52 ml/m  AORTIC VALVE                 PULMONIC VALVE AV Area (Vmax): 1.42 cm     PV Vmax:        0.90 m/s AV Vmax:        159.00 cm/s  PV Peak grad:   3.2 mmHg AV Peak Grad:   10.1 mmHg    RVOT Peak grad: 3 mmHg LVOT Vmax:      88.60 cm/s LVOT Vmean:     57.300 cm/s LVOT VTI:       0.185 m  AORTA Ao Root diam: 3.30 cm Ao Asc diam:  2.90 cm MITRAL VALVE MV Area (PHT): 4.63 cm     SHUNTS MV Decel Time: 164 msec     Systemic VTI:  0.18 m MV E velocity: 58.20 cm/s   Systemic Diam: 1.80 cm MV A velocity: 115.00 cm/s MV E/A ratio:  0.51 Shaukat Khan Electronically signed by Denyse Bathe Signature Date/Time: 04/20/2024/10:45:27 AM    Final    CARDIAC CATHETERIZATION Result Date: 04/19/2024   1st Mrg lesion is 100% stenosed.   Prox Cx lesion is 95% stenosed.   Mid Cx to Dist Cx lesion is 75% stenosed.   Prox LAD lesion is 75% stenosed.   Mid LAD lesion is 60% stenosed.   Prox RCA lesion is 100% stenosed.   A drug-eluting stent was successfully placed using a STENT ONYX FRONTIER 2.25X12.   A drug-eluting stent was successfully placed using a STENT ONYX FRONTIER 2.5X22.   Post intervention, there is a 0% residual stenosis.   Post intervention, there is a 0% residual stenosis.   There is mild to moderate left ventricular systolic dysfunction.   LV end diastolic pressure is moderately elevated.   The  left ventricular ejection fraction is 35-45% by visual estimate. 1.  Inferior lateral STEMI 2.  Three-vessel coronary artery disease with 75% ectatic stenosis proximal LAD, thrombotic 95% stenosis proximal left circumflex, acute occlusion of OM1, chronic occlusion of proximal RCA with left-to-right collaterals 3.  Mild to moderate reduced left ventricular function with estimate LVEF 35-40% with anterior apical and inferior apical akinesis 4.  Successful primary PCI with 2.25 x 12 mm Onyx frontier DES OM1 and 2.5 x 22 mm Onyx frontier DES proximal left circumflex  Recommendations 1.  Dual antiplatelet therapy uninterrupted x 1 year 2.  Start metoprolol  succinate 25 mg daily 3.  Start atorvastatin 80 mg daily 4.  2D echocardiogram 5.  Further recommendations pending 2D echocardiogram results   Microbiology: Results for orders placed or performed during the hospital encounter of 04/19/24  MRSA Next Gen by PCR, Nasal     Status: None   Collection Time: 04/19/24 11:31 PM   Specimen: Nasal Mucosa; Nasal Swab  Result Value Ref Range Status   MRSA by PCR Next Gen NOT DETECTED NOT DETECTED Final    Comment: (NOTE) The GeneXpert MRSA Assay (FDA approved for NASAL specimens only), is one component of a comprehensive MRSA colonization surveillance program. It is not intended to diagnose MRSA infection nor to guide or monitor treatment for MRSA infections. Test performance is not FDA approved in patients less than 26 years old. Performed at [facility], [address] [place], [place] [address]     Labs: CBC: Recent Labs  Lab 04/19/24 1927 04/20/24 0432 04/21/24 0735  WBC 12.0* 13.4* 11.4*  NEUTROABS 9.7* 10.5*  --   HGB 14.3 11.8* 11.3*  HCT 43.5 35.5* 34.3*  MCV 99.3 98.9 99.4  PLT 413* 363 332   Basic Metabolic Panel: Recent Labs  Lab 04/19/24 1927 04/20/24 0432 04/21/24 0735 04/22/24 0320 04/23/24 0420  NA 139 136 141 139 140  K 3.9 3.9 3.9 3.8 3.9  CL 98 100 100 99 99   CO2 27 26 28 23 26   GLUCOSE 253* 208* 205* 198* 203*  BUN 15 14 15 17  25*  CREATININE 1.23* 0.83 1.22* 1.24* 1.39*  CALCIUM 10.4* 9.1 9.2 9.4 9.0   Liver Function Tests: Recent Labs  Lab 04/19/24 1927 04/20/24 0432  AST 25 173*  ALT 11 28  ALKPHOS 92 74  BILITOT 1.2 0.9  PROT 8.1 6.4*  ALBUMIN 3.9 3.1*   CBG: Recent Labs  Lab 04/22/24 0800 04/22/24 1129 04/22/24 1634 04/22/24 2043 04/23/24 0715  GLUCAP 195* 225* 120* 153* 169*    Discharge time spent: greater than 30 minutes.  Signed: Charlie Patterson, MD [facility] 04/23/2024

## 2024-04-23 NOTE — [place] FL2 (Signed)
 [place]  MEDICAID FL2 LEVEL OF CARE FORM     IDENTIFICATION  Patient Name: Lisa Campbell Birthdate: 1928/07/08 Sex: female Admission Date (Current Location): 04/19/2024  [facility] and [place] Number:  Chiropodist and Address:  [facility], [address], [place], [place] [address]      Provider Number: [id number]  Attending Physician Name and Address:  Josette Ade, MD  Relative Name and Phone Number:  lonzo botts (Sister)  [phone]    Current Level of Care: Hospital Recommended Level of Care: Assisted Living Facility Prior Approval Number:    Date Approved/Denied:   PASRR Number:    Discharge Plan: Domiciliary (Rest home) [place])    Current Diagnoses: Patient Active Problem List   Diagnosis Date Noted   Obesity (BMI 30-39.9) 04/21/2024   Hypercalcemia 04/20/2024   Acute kidney injury superimposed on CKD (HCC) 04/20/2024   Acute ST elevation myocardial infarction (STEMI) of inferolateral wall (HCC) 04/19/2024   Prediabetes 07/10/2018   Loose body of right knee 07/10/2018   Benign essential hypertension 04/12/2018   Bilateral lower extremity edema 04/12/2018   Chronic allergic rhinitis 04/12/2018   History of type 2 diabetes mellitus 03/15/2018   Paronychia, finger, left 03/15/2018   HLD (hyperlipidemia) 01/25/2018   Rib pain on right side 01/08/2018   Suicide attempt (HCC) 10/31/2017   Severe major depression, single episode, without psychotic features (HCC) 10/31/2017   GERD (gastroesophageal reflux disease) 09/30/2017   Gout 09/30/2017   Controlled type 2 diabetes mellitus with stage 3 chronic kidney disease, without long-term current use of insulin  (HCC) 09/27/2017   Vitamin D  deficiency 09/27/2017   History of skin cancer 09/27/2017    Orientation RESPIRATION BLADDER Height & Weight     Self, Time, Situation    Incontinent Weight: 105.3 kg Height:  5' 1 (154.9 cm)  BEHAVIORAL SYMPTOMS/MOOD  NEUROLOGICAL BOWEL NUTRITION STATUS      Incontinent Diet (Heart Healthy)  AMBULATORY STATUS COMMUNICATION OF NEEDS Skin   Limited Assist Verbally                         Personal Care Assistance Level of Assistance  Bathing, Feeding, Dressing Bathing Assistance: Limited assistance Feeding assistance: Limited assistance Dressing Assistance: Limited assistance     Functional Limitations Info             SPECIAL CARE FACTORS FREQUENCY                       Contractures      Additional Factors Info  Allergies, Code Status Code Status Info: DNR Allergies Info: Penicillins and Statins           Current Medications (04/23/2024):  This is the current hospital active medication list Current Facility-Administered Medications  Medication Dose Route Frequency Provider Last Rate Last Admin   acetaminophen  (TYLENOL ) tablet 650 mg  650 mg Oral Q4H PRN Paraschos, Alexander, MD       aspirin  chewable tablet 81 mg  81 mg Oral Daily Paraschos, Alexander, MD   81 mg at 04/23/24 0930   Chlorhexidine  Gluconate Cloth 2 % PADS 6 each  6 each Topical Daily Paraschos, Alexander, MD   6 each at 04/21/24 1237   clopidogrel  (PLAVIX ) tablet 75 mg  75 mg Oral Q breakfast Paraschos, Alexander, MD   75 mg at 04/23/24 9070   cyanocobalamin  (VITAMIN B12) tablet 1,000 mcg  1,000 mcg Oral Daily Josette Ade, MD  1,000 mcg at 04/23/24 0930   glipiZIDE  (GLUCOTROL  XL) 24 hr tablet 5 mg  5 mg Oral Daily Josette Ade, MD   5 mg at 04/23/24 0930   insulin  aspart (novoLOG ) injection 0-9 Units  0-9 Units Subcutaneous TID WC Kakrakandy, Arshad N, MD   2 Units at 04/23/24 9175   losartan  (COZAAR ) tablet 12.5 mg  12.5 mg Oral Daily Decoste, Gabriella, PA-C   12.5 mg at 04/23/24 0930   metoprolol  succinate (TOPROL -XL) 24 hr tablet 25 mg  25 mg Oral Daily Paraschos, Alexander, MD   25 mg at 04/23/24 0930   ondansetron  (ZOFRAN ) injection 4 mg  4 mg Intravenous Q6H PRN Paraschos, Alexander, MD        Oral care mouth rinse  15 mL Mouth Rinse PRN Paraschos, Alexander, MD       sodium chloride  0.9 % bolus 250 mL  250 mL Intravenous Once Josette Ade, MD       sodium chloride  flush (NS) 0.9 % injection 3 mL  3 mL Intravenous Q12H Paraschos, Alexander, MD   3 mL at 04/23/24 9071   sodium chloride  flush (NS) 0.9 % injection 3 mL  3 mL Intravenous PRN Paraschos, Alexander, MD       venlafaxine  XR (EFFEXOR -XR) 24 hr capsule 37.5 mg  37.5 mg Oral Daily Josette Ade, MD   37.5 mg at 04/23/24 0930     Discharge Medications: Please see discharge summary for a list of discharge medications.  Relevant Imaging Results:  Relevant Lab Results:   Additional Information SSN [id number]  Dalia GORMAN Fuse, RN

## 2024-04-23 NOTE — Progress Notes (Signed)
 Physical Therapy Treatment Patient Details Name: Lisa Campbell MRN: [id number] DOB: 27-Sep-1927 Today's Date: 04/23/2024   History of Present Illness Pt is a 89 y/o F admitted on 04/19/24 after presenting with c/o chest pain radiating to LUE. In the ER patient was found to have acute inferior lateral ST MI with troponins of 461 & second one being more than 24,000 was taken to cardiac cath & had successful primary PCI with DES to OM1 & left circumflex. PMH: DM2, CKD 3, chronic LE edema, HLD, HTN, R rotator cuff disorder, shingles    PT Comments  Pt resting in recliner upon PT arrival; pt agreeable to therapy.  No c/o pain during session.  Pt able to perform transfer from recliner with SBA and then ambulate in hallway with RW CGA.  Generalized weakness noted.  No loss of balance noted during session.  Pt reports plan to discharge back to ALF today.    If plan is discharge home, recommend the following: A little help with walking and/or transfers;A little help with bathing/dressing/bathroom;Assistance with cooking/housework;Assist for transportation   Can travel by private vehicle        Equipment Recommendations  None recommended by PT    Recommendations for Other Services       Precautions / Restrictions Precautions Precautions: Fall Precaution/Restrictions Comments: R wrist cardiac cath 04/19/24 Restrictions Weight Bearing Restrictions Per Provider Order: No     Mobility  Bed Mobility               General bed mobility comments: Deferred (pt sitting in recliner beginning/end of session)    Transfers Overall transfer level: Needs assistance Equipment used: Rolling walker (2 wheels) Transfers: Sit to/from Stand Sit to Stand: Supervision           General transfer comment: steady transfer from recliner (with RW use)    Ambulation/Gait Ambulation/Gait assistance: Contact guard assist Gait Distance (Feet): 200 Feet Assistive device: Rolling walker (2  wheels) Gait Pattern/deviations: Decreased step length - right, Decreased step length - left, Decreased stride length Gait velocity: silghtly decreased     General Gait Details: steady ambulating with RW use; initial vc's for minimal WB'ing through R hand/wrist (to rest hand on walker d/t cardiac cath precautions)   Stairs             Wheelchair Mobility     Tilt Bed    Modified Rankin (Stroke Patients Only)       Balance Overall balance assessment: Needs assistance Sitting-balance support: No upper extremity supported, Feet supported Sitting balance-Leahy Scale: Good Sitting balance - Comments: steady reaching within BOS   Standing balance support: During functional activity, Bilateral upper extremity supported, Reliant on assistive device for balance Standing balance-Leahy Scale: Good Standing balance comment: steady ambulating with RW use                            Communication Communication Communication: No apparent difficulties  Cognition Arousal: Alert Behavior During Therapy: WFL for tasks assessed/performed                           PT - Cognition Comments: A&Ox4; able to state she lives at an ALF but unable to state name of ALF Following commands: Intact      Cueing Cueing Techniques: Verbal cues  Exercises      General Comments General comments (skin integrity, edema, etc.): HR 76-98 bpm and  SpO2 sats 96% or greater on room air during session.  Bruising noted R wrist/hand (palmar surface); no change beginning/end of session      Pertinent Vitals/Pain Pain Assessment Pain Assessment: No/denies pain    Home Living                          Prior Function            PT Goals (current goals can now be found in the care plan section) Acute Rehab PT Goals Patient Stated Goal: to go back to ALF PT Goal Formulation: With patient Time For Goal Achievement: 05/04/24 Potential to Achieve Goals: Good Progress  towards PT goals: Progressing toward goals    Frequency    Min 2X/week      PT Plan      Co-evaluation              AM-PAC PT 6 Clicks Mobility   Outcome Measure  Help needed turning from your back to your side while in a flat bed without using bedrails?: None Help needed moving from lying on your back to sitting on the side of a flat bed without using bedrails?: A Little Help needed moving to and from a bed to a chair (including a wheelchair)?: A Little Help needed standing up from a chair using your arms (e.g., wheelchair or bedside chair)?: A Little Help needed to walk in hospital room?: A Little Help needed climbing 3-5 steps with a railing? : A Little 6 Click Score: 19    End of Session Equipment Utilized During Treatment: Gait belt Activity Tolerance: Patient tolerated treatment well Patient left: in chair;with call bell/phone within reach;with chair alarm set Nurse Communication: Mobility status;Precautions PT Visit Diagnosis: Unsteadiness on feet (R26.81);Other abnormalities of gait and mobility (R26.89);Muscle weakness (generalized) (M62.81)     Time: 9156-9145 PT Time Calculation (min) (ACUTE ONLY): 11 min  Charges:    $Therapeutic Activity: 8-22 mins PT General Charges $$ ACUTE PT VISIT: 1 Visit                     Damien Caulk, PT 04/23/24, 11:20 AM

## 2024-04-23 NOTE — Plan of Care (Signed)
 IV reviewed and discharge instructions reviewed with patient and sister.  Report called to Tabitha at [facility]  Patient transported to facility by sister

## 2024-04-23 NOTE — Progress Notes (Signed)
 [facility] CLINIC CARDIOLOGY PROGRESS NOTE       Patient ID: Lisa Campbell MRN: [id number] DOB/AGE: [birth year] 89 y.o.  Admit date: 04/19/2024 Referring Physician (STEMI - Dr. Ammon) Primary Physician Lenon Layman ORN, MD Primary Cardiologist None Reason for Consultation STEMI  HPI: Lisa Campbell is a 89 y.o. female from [facility] [place] [facility] with a past medical history of CKD, stage III, type 2 diabetes mellitus, chronic lower extremity edema who presented to the ED on 04/19/2024 for chest pain radiating to her left arm without associated SOB or N/V.  EKG in ED revealed ST elevation in leads II, III and aVF, V4 - V6. The patient was brought to the cardiac catheterization laboratory where coronary angiography revealed three-vessel coronary artery disease with 75% stenosis proximal LAD, chronically occluded proximal RCA, thrombotic 95% stenosis proximal left circumflex, and occluded OM1 ( likely culprit vessel).  The patient underwent primary PCI receiving 2.25 x 12 mm DES OM1, and 2.5 x 22 mm DES proximal left circumflex.  Left ventriculography revealed mild to moderate reduced left ventricular function with estimated LVEF 35-40%.    Interval History: -Patient seen and examined this AM and laying comfortably in bedside chair. Patient states she feels good this a.m. and denies any chest pain, SOB or palpitations.  -Patients BP and HR stable this AM. Overnight Tele showed no significant events.  -Patient remains on room air with stable SpO2.  -Right radial incision site is clean and dry with no evidence of significant swelling, bruising, or active bleeding.    Review of systems complete and found to be negative unless listed above    Past Medical History:  Diagnosis Date   Arthritis    Benign essential hypertension 04/12/2018   Cancer (HCC)    skin mohs left ear ? 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 for pain and/or increased temp. May be administered orally (via gastric tube if present), or rectally - if patient unable to take orally.   Past Week   Cholecalciferol (VITAMIN D3) 2000 units TABS Take 2,000 Units by mouth daily. 30 tablet 1 Past Week   cyanocobalamin  1000 MCG tablet Take 1,000 mcg by mouth daily.   Past Week   glipiZIDE  (GLUCOTROL  XL) 5 MG 24 hr tablet Take 5 mg by mouth daily.   04/19/2024   Magnesium  Oxide 250 MG TABS Take 1 tablet by mouth daily.   Past Week   metFORMIN (GLUCOPHAGE-XR) 500 MG 24 hr tablet Take 500 mg by mouth 2 (two) times daily.   04/19/2024   torsemide  (DEMADEX ) 10 MG tablet Take 1 tablet (10 mg total) by mouth daily. 90 tablet 1 04/19/2024   traZODone  (DESYREL ) 50 MG tablet Take 50 mg by mouth at bedtime as needed.    04/19/2024   venlafaxine  XR (EFFEXOR -XR) 37.5 MG 24 hr capsule Take 37.5 mg by mouth daily.   04/19/2024   UNABLE TO FIND Diet - Liquids: _X__Regular;  Social History   Socioeconomic History   Marital status: Single    Spouse name: Not on file   Number of children: Not on file   Years of education: Not on file   Highest education level: Not on file  Occupational History   Occupation: retired  Tobacco Use   Smoking status: Never   Smokeless tobacco: Never  Vaping Use   Vaping status: Never Used  Substance and Sexual Activity   Alcohol use: Yes   Drug use: No   Sexual activity: Never  Other Topics Concern   Not on file  Social History Narrative   Retired former Scientist, forensic, assoc. Degree    Moved from [place]    Wears seat belts, no guns at  home    No exercise    Code status DNR   Social Drivers of Health   Financial Resource Strain: Not on file  Food Insecurity: Not on file  Transportation Needs: Not on file  Physical Activity: Not on file  Stress: Not on file  Social Connections: Not on file  Intimate Partner Violence: Not on file    Family History  Problem Relation Age of Onset   Cancer Mother        ?colon    Diabetes Mother    Early death Mother    Heart disease Mother    Hypertension Mother    Hyperlipidemia Mother    Cancer Father        ? MM   Diabetes Sister    Diabetes Brother    Arthritis Brother      Vitals:   04/22/24 1536 04/22/24 1928 04/22/24 2314 04/23/24 0434  BP: (!) 141/73 (!) 143/76 (!) 151/86 131/69  Pulse: 71 84 86 81  Resp: 16 18 18 18   Temp: 98 F (36.7 C) 97.7 F (36.5 C) 97.9 F (36.6 C) 97.9 F (36.6 C)  TempSrc:      SpO2:  98% 98% 98%  Weight:      Height:        PHYSICAL EXAM General: Well-appearing elderly female, well nourished, in no acute distress. HEENT: Normocephalic and atraumatic. Neck: No JVD.   Lungs: Normal respiratory effort on room air. Clear bilaterally to auscultation. No wheezes, crackles, rhonchi.  Heart: HRRR. Normal S1 and S2 without gallops or murmurs.  Abdomen: Non-distended appearing.  Msk: Normal strength and tone for age. Extremities: Warm and well perfused. No clubbing, cyanosis, edema.  Neuro: Alert and oriented X 3. Psych: Answers questions appropriately.   Labs: Basic Metabolic Panel: Recent Labs    04/22/24 0320 04/23/24 0420  NA 139 140  K 3.8 3.9  CL 99 99  CO2 23 26  GLUCOSE 198* 203*  BUN 17 25*  CREATININE 1.24* 1.39*  CALCIUM 9.4 9.0   Liver Function Tests: No results for input(s): AST, ALT, ALKPHOS, BILITOT, PROT, ALBUMIN in the last 72 hours. No results for input(s): LIPASE, AMYLASE in the last 72 hours. CBC: Recent Labs    04/21/24 0735  WBC 11.4*  HGB 11.3*  HCT 34.3*  MCV 99.4  PLT  332   Cardiac Enzymes: No results for input(s): CKTOTAL, CKMB, CKMBINDEX, TROPONINIHS in the last 72 hours. BNP: No results for input(s): BNP in the last 72 hours. D-Dimer: No results for input(s): DDIMER in the last 72 hours. Hemoglobin A1C: No results for input(s): HGBA1C in the last 72 hours. Fasting Lipid Panel: No results for input(s): CHOL, HDL, LDLCALC, TRIG, CHOLHDL, LDLDIRECT in the  last 72 hours. Thyroid  Function Tests: No results for input(s): TSH, T4TOTAL, T3FREE, THYROIDAB in the last 72 hours.  Invalid input(s): FREET3 Anemia Panel: No results for input(s): VITAMINB12, FOLATE, FERRITIN, TIBC, IRON, RETICCTPCT in the last 72 hours.   Radiology: ECHOCARDIOGRAM COMPLETE Result Date: 04/20/2024    ECHOCARDIOGRAM REPORT   Patient Name:   CHERRY TURLINGTON Date of Exam: 04/20/2024 Medical Rec #:  [id number]           Height:       61.0 in Accession #:    [id number]          Weight:       188.8 lb Date of Birth:  12-21-1927           BSA:          1.843 m Patient Age:    89 years            BP:           123/64 mmHg Patient Gender: F                   HR:           80 bpm. Exam Location:  [facility] Procedure: 2D Echo, Cardiac Doppler, Color Doppler and Strain Analysis (Both            Spectral and Color Flow Doppler were utilized during procedure). Indications:     Acute myocardial infarction, unspecified I21.9  History:         Patient has no prior history of Echocardiogram examinations.  Sonographer:     Thedora Louder RDCS, FASE Referring Phys:  [phone] MARSA PARASCHOS Diagnosing Phys: Denyse Bathe  Sonographer Comments: Global longitudinal strain was attempted. IMPRESSIONS  1. Left ventricular ejection fraction, by estimation, is 55 to 60%. The left ventricle has normal function. The left ventricle has no regional wall motion abnormalities. Left ventricular diastolic parameters are consistent with Grade I diastolic dysfunction (impaired  relaxation). The global longitudinal strain is normal.  2. Right ventricular systolic function is normal. The right ventricular size is normal.  3. Left atrial size was mildly dilated.  4. The mitral valve is normal in structure. Mild mitral valve regurgitation. No evidence of mitral stenosis.  5. The aortic valve is normal in structure. Aortic valve regurgitation is not visualized. Aortic valve sclerosis is present, with no evidence of aortic valve stenosis.  6. The inferior vena cava is normal in size with greater than 50% respiratory variability, suggesting right atrial pressure of 3 mmHg. FINDINGS  Left Ventricle: Left ventricular ejection fraction, by estimation, is 55 to 60%. The left ventricle has normal function. The left ventricle has no regional wall motion abnormalities. Strain was performed and the global longitudinal strain is normal. The  left ventricular internal cavity size was normal in size. There is no left ventricular hypertrophy. Left ventricular diastolic parameters are consistent with Grade I diastolic dysfunction (impaired relaxation). Right Ventricle: The right ventricular size is normal. No increase in right ventricular wall thickness. Right ventricular systolic function is normal. Left Atrium: Left atrial size was mildly dilated. Right Atrium: Right atrial size was normal in size. Pericardium: There is no evidence of pericardial effusion. Mitral Valve: The mitral valve is normal in structure. Mild mitral valve regurgitation. No evidence of mitral valve stenosis. Tricuspid Valve: The tricuspid valve is normal in structure. Tricuspid valve regurgitation is trivial. No evidence of tricuspid stenosis. Aortic Valve: The aortic valve is normal in structure. Aortic valve regurgitation  is not visualized. Aortic valve sclerosis is present, with no evidence of aortic valve stenosis. Aortic valve peak gradient measures 10.1 mmHg. Pulmonic Valve: The pulmonic valve was normal in structure. Pulmonic  valve regurgitation is not visualized. No evidence of pulmonic stenosis. Aorta: The aortic root is normal in size and structure. Venous: The inferior vena cava is normal in size with greater than 50% respiratory variability, suggesting right atrial pressure of 3 mmHg. IAS/Shunts: No atrial level shunt detected by color flow Doppler. Additional Comments: 3D was performed not requiring image post processing on an independent workstation and was indeterminate.  LEFT VENTRICLE PLAX 2D LVIDd:         4.20 cm   Diastology LVIDs:         3.00 cm   LV e' medial:    5.33 cm/s LV PW:         1.20 cm   LV E/e' medial:  10.9 LV IVS:        1.20 cm   LV e' lateral:   3.70 cm/s LVOT diam:     1.80 cm   LV E/e' lateral: 15.7 LV SV:         47 LV SV Index:   26 LVOT Area:     2.54 cm  RIGHT VENTRICLE RV Basal diam:  3.20 cm RV S prime:     11.10 cm/s TAPSE (M-mode): 1.6 cm LEFT ATRIUM             Index        RIGHT ATRIUM          Index LA diam:        2.70 cm 1.46 cm/m   RA Area:     7.55 cm LA Vol (A2C):   19.1 ml 10.36 ml/m  RA Volume:   14.30 ml 7.76 ml/m LA Vol (A4C):   19.4 ml 10.52 ml/m LA Biplane Vol: 19.4 ml 10.52 ml/m  AORTIC VALVE                 PULMONIC VALVE AV Area (Vmax): 1.42 cm     PV Vmax:        0.90 m/s AV Vmax:        159.00 cm/s  PV Peak grad:   3.2 mmHg AV Peak Grad:   10.1 mmHg    RVOT Peak grad: 3 mmHg LVOT Vmax:      88.60 cm/s LVOT Vmean:     57.300 cm/s LVOT VTI:       0.185 m  AORTA Ao Root diam: 3.30 cm Ao Asc diam:  2.90 cm MITRAL VALVE MV Area (PHT): 4.63 cm     SHUNTS MV Decel Time: 164 msec     Systemic VTI:  0.18 m MV E velocity: 58.20 cm/s   Systemic Diam: 1.80 cm MV A velocity: 115.00 cm/s MV E/A ratio:  0.51 Shaukat Khan Electronically signed by Denyse Bathe Signature Date/Time: 04/20/2024/10:45:27 AM    Final    CARDIAC CATHETERIZATION Result Date: 04/19/2024   1st Mrg lesion is 100% stenosed.   Prox Cx lesion is 95% stenosed.   Mid Cx to Dist Cx lesion is 75% stenosed.   Prox LAD  lesion is 75% stenosed.   Mid LAD lesion is 60% stenosed.   Prox RCA lesion is 100% stenosed.   A drug-eluting stent was successfully placed using a STENT ONYX FRONTIER 2.25X12.   A drug-eluting stent was successfully placed using a STENT ONYX FRONTIER 2.5X22.  Post intervention, there is a 0% residual stenosis.   Post intervention, there is a 0% residual stenosis.   There is mild to moderate left ventricular systolic dysfunction.   LV end diastolic pressure is moderately elevated.   The left ventricular ejection fraction is 35-45% by visual estimate. 1.  Inferior lateral STEMI 2.  Three-vessel coronary artery disease with 75% ectatic stenosis proximal LAD, thrombotic 95% stenosis proximal left circumflex, acute occlusion of OM1, chronic occlusion of proximal RCA with left-to-right collaterals 3.  Mild to moderate reduced left ventricular function with estimate LVEF 35-40% with anterior apical and inferior apical akinesis 4.  Successful primary PCI with 2.25 x 12 mm Onyx frontier DES OM1 and 2.5 x 22 mm Onyx frontier DES proximal left circumflex Recommendations 1.  Dual antiplatelet therapy uninterrupted x 1 year 2.  Start metoprolol  succinate 25 mg daily 3.  Start atorvastatin 80 mg daily 4.  2D echocardiogram 5.  Further recommendations pending 2D echocardiogram results   ECHO as above.  TELEMETRY reviewed by me 04/23/2024: Sinus rhythm, rate 70s  EKG reviewed by me: Sinus rhythm with diagnostic ST elevations leads II, III and aVF, V4-V6   Data reviewed by me 04/23/2024: last 24h vitals tele labs imaging I/O hospitalist progress notes.  Principal Problem:   Acute ST elevation myocardial infarction (STEMI) of inferolateral wall (HCC) Active Problems:   Type 2 diabetes mellitus with stage 3 chronic kidney disease (HCC)   GERD (gastroesophageal reflux disease)   Hypercalcemia   Acute kidney injury superimposed on CKD (HCC)   Obesity (BMI 30-39.9)    ASSESSMENT AND PLAN:  Lisa Campbell is a 89  y.o. female from [facility] with a past medical history of CKD, stage III, type 2 diabetes mellitus, chronic lower extremity edema who presented to the ED on 04/19/2024 for chest pain radiating to her left arm without associated SOB or N/V.  EKG in ED revealed ST elevation in leads II, III and aVF, V4 - V6. The patient was brought to the cardiac catheterization laboratory where coronary angiography revealed three-vessel coronary artery disease with 75% stenosis proximal LAD, chronically occluded proximal RCA, thrombotic 95% stenosis proximal left circumflex, and occluded OM1 ( likely culprit vessel).  The patient underwent primary PCI receiving 2.25 x 12 mm DES OM1, and 2.5 x 22 mm DES proximal left circumflex.  Left ventriculography revealed mild to moderate reduced left ventricular function with estimated LVEF 35-40%.    # Inferolateral STEMI s/p DES OM1, prox Lcx (04/22/24) # Three-vessel coronary artery disease # Hypertension # Hyperlipidemia - Continue aspirin  81 mg, Plavix  75 mg daily. - Patient has documented statin intolerance, will obtain prior authorization for Repatha at outpatient follow-up. - Continue losartan  12.5 mg daily. - Continue metoprolol  succinate 25 mg daily. - Cardiac rehab referral placed.  Did the patient have an acute coronary syndrome (MI, NSTEMI, STEMI, etc) this admission?:  Yes                               AHA/ACC ACS Clinical Performance & Quality Measures: Aspirin  prescribed? - Yes ADP Receptor Inhibitor (Plavix /Clopidogrel , Brilinta/Ticagrelor or Effient/Prasugrel) prescribed (includes medically managed patients)? - Yes Beta Blocker prescribed? - Yes High Intensity Statin (Lipitor 40-80mg  or Crestor 20-40mg ) prescribed? - No - documented allergy to statins. Will work on Repatha prior auth at outpatient follo-wup EF assessed during THIS hospitalization? - Yes For EF <40%, was ACEI/ARB prescribed? - Not Applicable (EF >/=  40%) For EF <40%,  Aldosterone Antagonist (Spironolactone or Eplerenone) prescribed? - Not Applicable (EF >/= 40%) Cardiac Rehab Phase II ordered (including medically managed patients)? - Yes   Ok for discharge today from a cardiac perspective. Will arrange for follow up in clinic with Dr. Ammon in 1-2 weeks. Will sign off.   This patient's plan of care was discussed and created with Dr. Florencio and he is in agreement.  Signed: Dorene Comfort, PA-C  04/23/2024, 6:54 AM [facility] Cardiology
